# Patient Record
Sex: Female | Born: 1960 | Race: Black or African American | Hispanic: No | Marital: Married | Smoking: Never smoker
Health system: Southern US, Community
[De-identification: ages and names within clinical notes are randomized; demographics above are authoritative.]

## PROBLEM LIST (undated history)

## (undated) DIAGNOSIS — J189 Pneumonia, unspecified organism: Secondary | ICD-10-CM

## (undated) DIAGNOSIS — Z992 Dependence on renal dialysis: Secondary | ICD-10-CM

## (undated) DIAGNOSIS — I10 Essential (primary) hypertension: Secondary | ICD-10-CM

## (undated) DIAGNOSIS — B20 Human immunodeficiency virus [HIV] disease: Secondary | ICD-10-CM

## (undated) DIAGNOSIS — Z8661 Personal history of infections of the central nervous system: Secondary | ICD-10-CM

## (undated) DIAGNOSIS — N186 End stage renal disease: Secondary | ICD-10-CM

## (undated) DIAGNOSIS — Z9289 Personal history of other medical treatment: Secondary | ICD-10-CM

## (undated) DIAGNOSIS — D649 Anemia, unspecified: Secondary | ICD-10-CM

## (undated) DIAGNOSIS — I639 Cerebral infarction, unspecified: Secondary | ICD-10-CM

## (undated) DIAGNOSIS — R569 Unspecified convulsions: Secondary | ICD-10-CM

## (undated) HISTORY — PX: EYE SURGERY: SHX253

## (undated) HISTORY — PX: UMBILICAL HERNIA REPAIR: SHX196

## (undated) HISTORY — PX: HERNIA REPAIR: SHX51

## (undated) HISTORY — PX: THROMBECTOMY: PRO61

## (undated) HISTORY — PX: AV FISTULA PLACEMENT: SHX1204

---

## 2002-02-28 HISTORY — PX: TUBAL LIGATION: SHX77

## 2012-09-29 ENCOUNTER — Emergency Department (HOSPITAL_COMMUNITY): Payer: Medicare Other

## 2012-09-29 ENCOUNTER — Inpatient Hospital Stay (HOSPITAL_COMMUNITY)
Admission: EM | Admit: 2012-09-29 | Discharge: 2012-10-02 | DRG: 640 | Disposition: A | Discharge status: Home / Self Care | Payer: Medicare Other | Attending: Nephrology | Admitting: Nephrology

## 2012-09-29 ENCOUNTER — Encounter (HOSPITAL_COMMUNITY): Payer: Self-pay | Admitting: *Deleted

## 2012-09-29 DIAGNOSIS — I120 Hypertensive chronic kidney disease with stage 5 chronic kidney disease or end stage renal disease: Secondary | ICD-10-CM | POA: Diagnosis present

## 2012-09-29 DIAGNOSIS — Z992 Dependence on renal dialysis: Secondary | ICD-10-CM

## 2012-09-29 DIAGNOSIS — I10 Essential (primary) hypertension: Secondary | ICD-10-CM

## 2012-09-29 DIAGNOSIS — Z8661 Personal history of infections of the central nervous system: Secondary | ICD-10-CM

## 2012-09-29 DIAGNOSIS — Z79899 Other long term (current) drug therapy: Secondary | ICD-10-CM

## 2012-09-29 DIAGNOSIS — N186 End stage renal disease: Secondary | ICD-10-CM

## 2012-09-29 DIAGNOSIS — E875 Hyperkalemia: Secondary | ICD-10-CM

## 2012-09-29 DIAGNOSIS — M899 Disorder of bone, unspecified: Secondary | ICD-10-CM | POA: Diagnosis present

## 2012-09-29 DIAGNOSIS — I959 Hypotension, unspecified: Secondary | ICD-10-CM | POA: Diagnosis not present

## 2012-09-29 DIAGNOSIS — B20 Human immunodeficiency virus [HIV] disease: Secondary | ICD-10-CM

## 2012-09-29 DIAGNOSIS — D631 Anemia in chronic kidney disease: Secondary | ICD-10-CM | POA: Diagnosis present

## 2012-09-29 DIAGNOSIS — N2581 Secondary hyperparathyroidism of renal origin: Secondary | ICD-10-CM | POA: Diagnosis present

## 2012-09-29 DIAGNOSIS — M949 Disorder of cartilage, unspecified: Secondary | ICD-10-CM | POA: Diagnosis present

## 2012-09-29 DIAGNOSIS — N19 Unspecified kidney failure: Secondary | ICD-10-CM

## 2012-09-29 HISTORY — DX: Personal history of infections of the central nervous system: Z86.61

## 2012-09-29 HISTORY — DX: End stage renal disease: N18.6

## 2012-09-29 HISTORY — DX: Human immunodeficiency virus (HIV) disease: B20

## 2012-09-29 HISTORY — DX: Essential (primary) hypertension: I10

## 2012-09-29 HISTORY — DX: End stage renal disease: Z99.2

## 2012-09-29 LAB — BASIC METABOLIC PANEL
CO2: 18 mEq/L — ABNORMAL LOW (ref 19–32)
Glucose, Bld: 111 mg/dL — ABNORMAL HIGH (ref 70–99)
Potassium: 7 mEq/L (ref 3.5–5.1)
Sodium: 137 mEq/L (ref 135–145)

## 2012-09-29 LAB — CBC WITH DIFFERENTIAL/PLATELET
Eosinophils Relative: 9 % — ABNORMAL HIGH (ref 0–5)
HCT: 33.6 % — ABNORMAL LOW (ref 36.0–46.0)
Lymphocytes Relative: 26 % (ref 12–46)
Lymphs Abs: 1.2 10*3/uL (ref 0.7–4.0)
MCV: 89.1 fL (ref 78.0–100.0)
Monocytes Absolute: 0.6 10*3/uL (ref 0.1–1.0)
RBC: 3.77 MIL/uL — ABNORMAL LOW (ref 3.87–5.11)
WBC: 4.7 10*3/uL (ref 4.0–10.5)

## 2012-09-29 MED ORDER — SODIUM CHLORIDE 0.9 % IV SOLN
1.0000 g | Freq: Once | INTRAVENOUS | Status: DC
  Filled 2012-09-29: qty 10

## 2012-09-29 MED ORDER — HEPARIN SODIUM (PORCINE) 1000 UNIT/ML DIALYSIS
2000.0000 [IU] | INTRAMUSCULAR | Status: DC | PRN
  Administered 2012-09-29: 2000 [IU] via INTRAVENOUS_CENTRAL

## 2012-09-29 MED ORDER — HEPARIN SODIUM (PORCINE) 1000 UNIT/ML DIALYSIS: 1000.0000 [IU] | INTRAMUSCULAR | Status: DC | PRN

## 2012-09-29 MED ORDER — ACETAMINOPHEN 325 MG PO TABS: 650.0000 mg | ORAL_TABLET | Freq: Four times a day (QID) | ORAL | Status: DC | PRN

## 2012-09-29 MED ORDER — SODIUM CHLORIDE 0.9 % IV SOLN: 100.0000 mL | INTRAVENOUS | Status: DC | PRN

## 2012-09-29 MED ORDER — POLYETHYLENE GLYCOL 3350 17 G PO PACK
17.0000 g | PACK | Freq: Every day | ORAL | Status: DC | PRN
  Filled 2012-09-29: qty 1

## 2012-09-29 MED ORDER — SODIUM CHLORIDE 0.9 % IJ SOLN
3.0000 mL | Freq: Two times a day (BID) | INTRAMUSCULAR | Status: DC
  Administered 2012-09-30 (×3): 3 mL via INTRAVENOUS

## 2012-09-29 MED ORDER — LIDOCAINE HCL (PF) 1 % IJ SOLN: 5.0000 mL | INTRAMUSCULAR | Status: DC | PRN

## 2012-09-29 MED ORDER — LIDOCAINE-PRILOCAINE 2.5-2.5 % EX CREA
1.0000 "application " | TOPICAL_CREAM | CUTANEOUS | Status: DC | PRN
  Filled 2012-09-29: qty 5

## 2012-09-29 MED ORDER — PENTAFLUOROPROP-TETRAFLUOROETH EX AERO: 1.0000 "application " | INHALATION_SPRAY | CUTANEOUS | Status: DC | PRN

## 2012-09-29 MED ORDER — ACETAMINOPHEN 650 MG RE SUPP: 650.0000 mg | Freq: Four times a day (QID) | RECTAL | Status: DC | PRN

## 2012-09-29 MED ORDER — ALTEPLASE 2 MG IJ SOLR
2.0000 mg | Freq: Once | INTRAMUSCULAR | Status: AC | PRN
  Filled 2012-09-29: qty 2

## 2012-09-29 MED ORDER — NEPRO/CARBSTEADY PO LIQD
237.0000 mL | ORAL | Status: DC | PRN
  Filled 2012-09-29: qty 237

## 2012-09-29 MED ORDER — BISACODYL 10 MG RE SUPP: 10.0000 mg | Freq: Every day | RECTAL | Status: DC | PRN

## 2012-09-29 MED ORDER — ALBUTEROL SULFATE (5 MG/ML) 0.5% IN NEBU
5.0000 mg | INHALATION_SOLUTION | Freq: Once | RESPIRATORY_TRACT | Status: AC
  Administered 2012-09-29: 5 mg via RESPIRATORY_TRACT
  Filled 2012-09-29: qty 0.5

## 2012-09-29 NOTE — Consult Note (Deleted)
Bethany [name] 09/29/2012 Kengo Sturges D Requesting Physician:  Dr Jeraldine Loots  Reason for Consult:  ESRD pt from [place] moved to town with husband's job change and couldn't set up new HD center prior to coming  HPI: The patient is a 52 y.o. year-old with hx of HTN, HIV and ESRD. Started HD in 2006.  Pt just moved to town with husband's job change this week and she says she wasn't able set up new HD center prior to coming. Last HD was 4days ago, reports ankle swelling.  No sob, cp, fevers, chills, sweats. No n/v/d.  No abd pain.  Does not make urine.  No problem getting medications.  Takes Renvela 3 ac for binder.  On ART with 4 meds, dx'd in 2000.  No complications.  On 3 BP meds   ROS  no skin rash, no itching  no confusion    Past Medical History  Past Medical History  Diagnosis Date  . 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  Started HD in [place] in 2006, moved to [place] and rec'd dialysis in [place] starting in 2009.  Was receiving HD TTS schedule with a [facility].  Her nephrologist was Dr Vinnie Langton.  She moved to [facility] this week (Sep 29, 2012) and was not able to set up new HD unit prior to coming.  Last HD was Tuesday July 29th.  HD access is R FA AVF.  Gets heparin at the center, dry weight was 56kg, ran 3 hours.     [name] HIV disease 09/29/2012    Diagnosed on 2000, on HIV meds.  On ART, viral levels undetectable per pt.     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    . Hypertension 09/29/2012   Past Surgical History History reviewed. No pertinent past surgical history. Family History History reviewed. No pertinent family history. Social History  reports that she does not drink alcohol or use illicit drugs. Her tobacco history is not on file. Allergies  Allergies  Allergen Reactions  . Benadryl (Diphenhydramine Hcl)     jittery  . Latex  Hives and Itching   Home medications Prior to Admission medications   Medication Sig Start Date End Date Taking? Authorizing Provider  abacavir (ZIAGEN) 300 MG tablet Take 300 mg by mouth 2 (two) times daily.   Yes Historical Provider, MD  amLODipine (NORVASC) 10 MG tablet Take 10 mg by mouth daily.   Yes Historical Provider, MD  cinacalcet (SENSIPAR) 30 MG tablet Take 30 mg by mouth at bedtime.   Yes Historical Provider, MD  cloNIDine (CATAPRES) 0.2 MG tablet Take 0.2 mg by mouth daily.   Yes Historical Provider, MD  etravirine (INTELENCE) 100 MG tablet Take 200 mg by mouth 2 (two) times daily with a meal.   Yes Historical Provider, MD  metoprolol (LOPRESSOR) 100 MG tablet Take 100 mg by mouth 2 (two) times daily.   Yes Historical Provider, MD  oxyCODONE-acetaminophen (ENDOCET) 5-325 MG per tablet Take 1 tablet by mouth every 4 (four) hours as needed for pain.   Yes Historical Provider, MD  raltegravir (ISENTRESS) 400 MG tablet Take 400 mg by mouth 2 (two) times daily.   Yes Historical Provider, MD  sevelamer carbonate (RENVELA) 2.4 G PACK Take 2.4 g by mouth 3 (three) times daily.   Yes Historical Provider,  MD  temazepam (RESTORIL) 15 MG capsule Take 15 mg by mouth at bedtime as needed for sleep.   Yes Historical Provider, MD  tenofovir (VIREAD) 300 MG tablet Take 300 mg by mouth every 7 (seven) days.   Yes Historical Provider, MD   Liver Function Tests No results found for this basename: AST, ALT, ALKPHOS, BILITOT, PROT, ALBUMIN,  in the last 168 hours No results found for this basename: LIPASE, AMYLASE,  in the last 168 hours CBC  Recent Labs Lab 09/29/12 1746  WBC 4.7  NEUTROABS 2.5  HGB 11.2*  HCT 33.6*  MCV 89.1  PLT 141*   Basic Metabolic Panel  Recent Labs Lab 09/29/12 1607  NA 137  K 7.0*  CL 95*  CO2 18*  GLUCOSE 111*  BUN 114*  CREATININE 15.68*  CALCIUM 7.7*   Physical Exam:  Blood pressure 130/70, pulse 57, temperature 97.8 F (36.6 C), temperature source  Oral, resp. rate 18, height 5\' 2"  (1.575 m), weight 56.7 kg (125 lb), SpO2 100.00%. Gen: alert, no distress Skin: no rash, cyanosis HEENT:  EOMI, sclera anicteric, throat dry Neck: no JVD no LAN Chest: clear bilat CV: regular, faint SEM, no rub, pedal pulses intact Abdomen: soft, nontender, no mass, liver down 3 cm Ext: no LE edema, no joint effusion, no gangrene/ulcers Neuro: alert, Ox3, nonfocal Access: R forearm AVF patent   Outpatient HD ([place] [facility]  TTS) Dry wt 56kg   Gets heparin   3hr HD  Impression 1. Hyperkalemia 2. Uremia, mild, from missed HD 3. HIV, on ART 4. ESRD, new to town without outpt unit 5. HTN- cont 3 BP meds 6. Anemia- Hb 11.2 7. Sec HPT- check phos  Plan-  Admit, HD tonight, start CLIP process on Monday, prob will need longer HD time  Vinson Moselle  MD Pager [phone]    Cell  [phone] 09/29/2012, 6:55 PM

## 2012-09-29 NOTE — ED Notes (Signed)
Pt reports recently moving here and they did not set up her dialysis treatments. Last tx was Tuesday, only complaint is mild swelling to feet. Airway intact.

## 2012-09-29 NOTE — ED Notes (Signed)
Critical lab potassium 7.0 reported to provider.

## 2012-09-29 NOTE — ED Provider Notes (Signed)
  This was a shared visit with a mid-level provided (NP or PA).  Throughout the patient's course I was available for consultation/collaboration.  I saw the ECG (if appropriate), relevant labs and studies - I agree with the interpretation. There were notable T wave peaks On my exam the patient was in no distress.  With her elevated potassium we discussed the patient's case with nephrology who assisted with disposition      Gerhard Munch, MD 09/29/12 2354

## 2012-09-29 NOTE — ED Notes (Signed)
Attempted IV unable to access stated in the past they had to page IV team. Paged IV team.

## 2012-09-29 NOTE — ED Notes (Addendum)
Pt came to ED due to missed hemodialysis appointment. She last received hemodialysis on Tue 7/29. She states she in not yet experiencing complications from missing her hemodialysis, but felt she would be experiencing complications soon due to past experience.

## 2012-09-29 NOTE — ED Notes (Signed)
IV team arrived patient refused IV until absolutely need IV.

## 2012-09-29 NOTE — ED Notes (Signed)
Attempted report 

## 2012-09-29 NOTE — ED Provider Notes (Signed)
CSN: [id number]     Arrival date & time 09/29/12  1446 History     First MD Initiated Contact with Patient 09/29/12 1504     Chief Complaint  Patient presents with  . Vascular Access Problem   (Consider location/radiation/quality/duration/timing/severity/associated sxs/prior Treatment) HPI Comments: Patient with h/o ESRD, HIV on HAART -- presents with complaint of missing dialysis. Patient states that she recently moved from [place] and her last dialysis was 4 days ago. She missed her session 2 days ago and today. She has a history of pulmonary edema when missing a weeks worth of sessions. Patient currently does not complain of shortness of breath. She has minimal edema to her feet. She denies recent illness including fever, vomiting. Patient has been taking her medications as directed. Onset of symptoms gradual. Course is constant. Nothing makes symptoms better or worse. Patient dialyzes through a right upper extremity fistula. Patient states that she spoke with her social worker regarding getting a dialysis site in [place], however she was rejected from that site because she needs an ambulance to go to dialysis.  The history is provided by the patient.    Past Medical History  Diagnosis Date  . Renal disorder   . Stroke   . Meningitis   . HIV disease    History reviewed. No pertinent past surgical history. History reviewed. No pertinent family history. History  Substance Use Topics  . Smoking status: Not on file  . Smokeless tobacco: Not on file  . Alcohol Use: No   OB History   Grav Para Term Preterm Abortions TAB SAB Ect Mult Living                 Review of Systems  Constitutional: Negative for fever.  HENT: Negative for sore throat and rhinorrhea.   Eyes: Negative for redness.  Respiratory: Negative for cough.   Cardiovascular: Positive for leg swelling. Negative for chest pain.  Gastrointestinal: Positive for nausea. Negative for vomiting, abdominal pain and  diarrhea.  Genitourinary: Negative for vaginal discharge.  Musculoskeletal: Negative for myalgias.  Skin: Negative for rash.  Neurological: Negative for headaches.    Allergies  Benadryl and Latex  Home Medications   Current Outpatient Rx  Name  Route  Sig  Dispense  Refill  . abacavir (ZIAGEN) 300 MG tablet   Oral   Take 300 mg by mouth 2 (two) times daily.         [name] amLODipine (NORVASC) 10 MG tablet   Oral   Take 10 mg by mouth daily.         . cinacalcet (SENSIPAR) 30 MG tablet   Oral   Take 30 mg by mouth at bedtime.         . cloNIDine (CATAPRES) 0.2 MG tablet   Oral   Take 0.2 mg by mouth daily.         [name] etravirine (INTELENCE) 100 MG tablet   Oral   Take 200 mg by mouth 2 (two) times daily with a meal.         . metoprolol (LOPRESSOR) 100 MG tablet   Oral   Take 100 mg by mouth 2 (two) times daily.         [name] oxyCODONE-acetaminophen (ENDOCET) 5-325 MG per tablet   Oral   Take 1 tablet by mouth every 4 (four) hours as needed for pain.         . raltegravir (ISENTRESS) 400 MG tablet   Oral   Take  400 mg by mouth 2 (two) times daily.         . sevelamer carbonate (RENVELA) 2.4 G PACK   Oral   Take 2.4 g by mouth 3 (three) times daily.         . temazepam (RESTORIL) 15 MG capsule   Oral   Take 15 mg by mouth at bedtime as needed for sleep.         [name] tenofovir (VIREAD) 300 MG tablet   Oral   Take 300 mg by mouth every 7 (seven) days.          BP 172/73  Pulse 89  Temp(Src) 98.2 F (36.8 C) (Oral)  Resp 18  SpO2 97% Physical Exam  Nursing note and vitals reviewed. Constitutional: She appears well-developed and well-nourished.  HENT:  Head: Normocephalic and atraumatic.  Eyes: Conjunctivae are normal. Right eye exhibits no discharge. Left eye exhibits no discharge.  Neck: Normal range of motion. Neck supple.  Cardiovascular: Normal rate, regular rhythm and normal heart sounds.   Pulmonary/Chest: Effort normal and breath  sounds normal.  Abdominal: Soft. There is no tenderness.  Musculoskeletal: She exhibits edema. She exhibits no tenderness.  1+ pitting edema top of feet and ankles bilaterally, symmetric.   Fistula with palpable thrill right upper extremity.  Neurological: She is alert.  Skin: Skin is warm and dry.  Psychiatric: She has a normal mood and affect.    ED Course   Procedures (including critical care time)  Labs Reviewed  BASIC METABOLIC PANEL - Abnormal; Notable for the following:    Potassium 7.0 (*)    Chloride 95 (*)    CO2 18 (*)    Glucose, Bld 111 (*)    BUN 114 (*)    Creatinine, Ser 15.68 (*)    Calcium 7.7 (*)    GFR calc non Af Amer 2 (*)    GFR calc Af Amer 3 (*)    All other components within normal limits  MAGNESIUM - Abnormal; Notable for the following:    Magnesium 3.2 (*)    All other components within normal limits  CBC WITH DIFFERENTIAL   Dg Chest 2 View  09/29/2012   *RADIOLOGY REPORT*  Clinical Data: Shortness of breath  CHEST - 2 VIEW  Comparison: None.  Findings: The cardiac shadow is enlarged.  Mild vascular congestion is noted.  No focal pulmonary edema is seen.  No focal infiltrate or effusion is noted.  IMPRESSION: Mild vascular congestion.   Original Report Authenticated By: Alcide Clever, M.D.   1. Hyperkalemia     3:25 PM Patient seen and examined. Work-up initiated. Medications ordered.   Vital signs reviewed and are as follows: Filed Vitals:   09/29/12 1450  BP: 172/73  Pulse: 89  Temp: 98.2 F (36.8 C)  Resp: 18    Date: 09/29/2012  Rate: 58  Rhythm: normal sinus rhythm  QRS Axis: normal  Intervals: normal  ST/T Wave abnormalities: nonspecific ST/T changes  Conduction Disutrbances:none, peaked t-waves V2-V3  Narrative Interpretation:   Old EKG Reviewed: none available  D/w Dr. Jeraldine Loots. K=7.0. Albuterol ordered. Ca ordered but cannot get IV access. Will defer to nephrology.   Spoke with Dr. Arlean Hopping who will see pt in ED.    MDM   Admit for dialysis in setting of hyperkalemia.    Renne Crigler, PA-C 09/29/12 1749

## 2012-09-29 NOTE — ED Notes (Signed)
Paged IV team to establish IV access PA and Nurse spoke with patient verbalized understanding.

## 2012-09-29 NOTE — ED Notes (Signed)
Nephrologist at bedside

## 2012-09-29 NOTE — ED Notes (Signed)
IV team unable to obtain access.  

## 2012-09-30 LAB — COMPREHENSIVE METABOLIC PANEL
ALT: 11 U/L (ref 0–35)
Alkaline Phosphatase: 110 U/L (ref 39–117)
BUN: 27 mg/dL — ABNORMAL HIGH (ref 6–23)
CO2: 29 mEq/L (ref 19–32)
Chloride: 94 mEq/L — ABNORMAL LOW (ref 96–112)
GFR calc Af Amer: 8 mL/min — ABNORMAL LOW (ref >90)
Glucose, Bld: 93 mg/dL (ref 70–99)
Potassium: 3.8 mEq/L (ref 3.5–5.1)
Sodium: 136 mEq/L (ref 135–145)
Total Bilirubin: 0.3 mg/dL (ref 0.3–1.2)
Total Protein: 7 g/dL (ref 6.0–8.3)

## 2012-09-30 LAB — HEPATITIS B SURFACE ANTIGEN: Hepatitis B Surface Ag: NEGATIVE

## 2012-09-30 LAB — HEPATITIS B CORE ANTIBODY, TOTAL: Hep B Core Total Ab: NEGATIVE

## 2012-09-30 MED ORDER — SEVELAMER CARBONATE 2.4 G PO PACK
2.4000 g | PACK | Freq: Three times a day (TID) | ORAL | Status: DC
  Administered 2012-10-01 – 2012-10-02 (×4): 2.4 g via ORAL
  Filled 2012-09-30 (×7): qty 1

## 2012-09-30 MED ORDER — ETRAVIRINE 100 MG PO TABS
200.0000 mg | ORAL_TABLET | Freq: Two times a day (BID) | ORAL | Status: DC
  Administered 2012-09-30 – 2012-10-02 (×5): 200 mg via ORAL
  Filled 2012-09-30 (×7): qty 2

## 2012-09-30 MED ORDER — TENOFOVIR DISOPROXIL FUMARATE 300 MG PO TABS: 300.0000 mg | ORAL_TABLET | ORAL | Status: DC

## 2012-09-30 MED ORDER — AMLODIPINE BESYLATE 10 MG PO TABS
10.0000 mg | ORAL_TABLET | Freq: Every day | ORAL | Status: DC
  Administered 2012-09-30 – 2012-10-01 (×2): 10 mg via ORAL
  Filled 2012-09-30 (×2): qty 1

## 2012-09-30 MED ORDER — CLONIDINE HCL 0.2 MG PO TABS
0.2000 mg | ORAL_TABLET | Freq: Every day | ORAL | Status: DC
  Administered 2012-09-30 – 2012-10-01 (×2): 0.2 mg via ORAL
  Filled 2012-09-30 (×2): qty 1

## 2012-09-30 MED ORDER — METOPROLOL TARTRATE 100 MG PO TABS
100.0000 mg | ORAL_TABLET | Freq: Two times a day (BID) | ORAL | Status: DC
  Administered 2012-09-30 – 2012-10-01 (×4): 100 mg via ORAL
  Filled 2012-09-30 (×5): qty 1

## 2012-09-30 MED ORDER — TEMAZEPAM 15 MG PO CAPS
15.0000 mg | ORAL_CAPSULE | Freq: Every evening | ORAL | Status: DC | PRN
  Administered 2012-10-01: 15 mg via ORAL
  Filled 2012-09-30: qty 1

## 2012-09-30 MED ORDER — SEVELAMER CARBONATE 800 MG PO TABS
2400.0000 mg | ORAL_TABLET | Freq: Three times a day (TID) | ORAL | Status: DC
  Administered 2012-09-30 (×2): 2400 mg via ORAL
  Filled 2012-09-30 (×4): qty 3

## 2012-09-30 MED ORDER — ABACAVIR SULFATE 300 MG PO TABS
300.0000 mg | ORAL_TABLET | Freq: Two times a day (BID) | ORAL | Status: DC
  Administered 2012-09-30 – 2012-10-01 (×5): 300 mg via ORAL
  Filled 2012-09-30 (×8): qty 1

## 2012-09-30 MED ORDER — OXYCODONE-ACETAMINOPHEN 5-325 MG PO TABS: 1.0000 | ORAL_TABLET | ORAL | Status: DC | PRN

## 2012-09-30 MED ORDER — RALTEGRAVIR POTASSIUM 400 MG PO TABS
400.0000 mg | ORAL_TABLET | Freq: Two times a day (BID) | ORAL | Status: DC
  Administered 2012-09-30 – 2012-10-02 (×6): 400 mg via ORAL
  Filled 2012-09-30 (×7): qty 1

## 2012-09-30 MED ORDER — CAMPHOR-MENTHOL 0.5-0.5 % EX LOTN
TOPICAL_LOTION | CUTANEOUS | Status: DC | PRN
Start: 2012-09-30 — End: 2012-10-02
  Filled 2012-09-30: qty 222

## 2012-09-30 MED ORDER — CINACALCET HCL 30 MG PO TABS
30.0000 mg | ORAL_TABLET | Freq: Every day | ORAL | Status: DC
  Administered 2012-09-30 – 2012-10-01 (×3): 30 mg via ORAL
  Filled 2012-09-30 (×5): qty 1

## 2012-09-30 NOTE — Progress Notes (Signed)
Pt admitted to the unit. Pt is alert and oriented. Pt oriented to room, staff, and call bell. Educated on fall safety plan. Bed in lowest position. Full assessment to Epic. Call bell with in reach. Educated to call for assists. Will continue to monitor. Romi Rathel, RN 

## 2012-09-30 NOTE — H&P (Signed)
Bethany [name] 09/29/2012 Korayma Hagwood D Requesting Physician:  Dr Lockwood  Reason for Consult:  [place]'s job change and couldn't set up new HD center prior to coming  HPI: The patient is a 52 y.o. year-old with hx of HTN, HIV and ESRD. Started HD in 2006.  Pt just moved to town with husband's job change this week and she says she wasn't able set up new HD center prior to coming. Last HD was 4days ago, reports ankle swelling.  No sob, cp, fevers, chills, sweats. No n/v/d.  No abd pain.  Does not make urine.  No problem getting medications.  Takes Renvela 3 ac for binder.  On ART with 4 meds, dx'd in 2000.  No complications.  On 3 BP meds   ROS  no skin rash, no itching  no confusion    Past Medical History  Past Medical History  Diagnosis Date  . 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  Started HD in [place] in 2006, moved to [place] and rec'd dialysis in [place] starting in 2009.  Was receiving HD TTS schedule with a [facility].  Her nephrologist was Dr Dada.  She moved to  this week (Sep 29, 2012) and was not able to set up new HD unit prior to coming.  Last HD was Tuesday July 29th.  HD access is R FA AVF.  Gets heparin at the center, dry weight was 56kg, ran 3 hours.     . HIV disease 09/29/2012    Diagnosed on 2000, on HIV meds.  On ART, viral levels undetectable per pt.     . 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    . Hypertension 09/29/2012   Past Surgical History History reviewed. No pertinent past surgical history. Family History History reviewed. No pertinent family history. Social History  reports that she does not drink alcohol or use illicit drugs. Her tobacco history is not on file. Allergies  Allergies  Allergen Reactions  . Benadryl (Diphenhydramine Hcl)     jittery  . Latex  Hives and Itching   Home medications Prior to Admission medications   Medication Sig Start Date End Date Taking? Authorizing Provider  abacavir (ZIAGEN) 300 MG tablet Take 300 mg by mouth 2 (two) times daily.   Yes Historical Provider, MD  amLODipine (NORVASC) 10 MG tablet Take 10 mg by mouth daily.   Yes Historical Provider, MD  cinacalcet (SENSIPAR) 30 MG tablet Take 30 mg by mouth at bedtime.   Yes Historical Provider, MD  cloNIDine (CATAPRES) 0.2 MG tablet Take 0.2 mg by mouth daily.   Yes Historical Provider, MD  etravirine (INTELENCE) 100 MG tablet Take 200 mg by mouth 2 (two) times daily with a meal.   Yes Historical Provider, MD  metoprolol (LOPRESSOR) 100 MG tablet Take 100 mg by mouth 2 (two) times daily.   Yes Historical Provider, MD  oxyCODONE-acetaminophen (ENDOCET) 5-325 MG per tablet Take 1 tablet by mouth every 4 (four) hours as needed for pain.   Yes Historical Provider, MD  raltegravir (ISENTRESS) 400 MG tablet Take 400 mg by mouth 2 (two) times daily.   Yes Historical Provider, MD  sevelamer carbonate (RENVELA) 2.4 G PACK Take 2.4 g by mouth 3 (three) times daily.   Yes Historical Provider,   MD  temazepam (RESTORIL) 15 MG capsule Take 15 mg by mouth at bedtime as needed for sleep.   Yes Historical Provider, MD  tenofovir (VIREAD) 300 MG tablet Take 300 mg by mouth every 7 (seven) days.   Yes Historical Provider, MD   Liver Function Tests No results found for this basename: AST, ALT, ALKPHOS, BILITOT, PROT, ALBUMIN,  in the last 168 hours No results found for this basename: LIPASE, AMYLASE,  in the last 168 hours CBC  Recent Labs Lab 09/29/12 1746  WBC 4.7  NEUTROABS 2.5  HGB 11.2*  HCT 33.6*  MCV 89.1  PLT 141*   Basic Metabolic Panel  Recent Labs Lab 09/29/12 1607  NA 137  K 7.0*  CL 95*  CO2 18*  GLUCOSE 111*  BUN 114*  CREATININE 15.68*  CALCIUM 7.7*   Physical Exam:  Blood pressure 130/70, pulse 57, temperature 97.8 F (36.6 C), temperature source  Oral, resp. rate 18, height 5' 2" (1.575 m), weight 56.7 kg (125 lb), SpO2 100.00%. Gen: alert, no distress Skin: no rash, cyanosis HEENT:  EOMI, sclera anicteric, throat dry Neck: no JVD no LAN Chest: clear bilat CV: regular, faint SEM, no rub, pedal pulses intact Abdomen: soft, nontender, no mass, liver down 3 cm Ext: no LE edema, no joint effusion, no gangrene/ulcers Neuro: alert, Ox3, nonfocal Access: R forearm AVF patent   Outpatient HD ([place] [facility]  TTS) Dry wt 56kg   Gets heparin   3hr HD  Impression 1. Hyperkalemia 2. Uremia, mild, from missed HD 3. HIV, on ART 4. ESRD, new to town without outpt unit 5. HTN- cont 3 BP meds 6. Anemia- Hb 11.2 7. Sec HPT- check phos  Plan-  Admit, HD tonight, start CLIP process on Monday, prob will need longer HD time  Rob Trinaty Bundrick  MD Pager [phone]    Cell  [phone] 09/29/2012, 6:55 PM    

## 2012-09-30 NOTE — Progress Notes (Signed)
Subjective:  Feels better, no cos Objective Vital signs in last 24 hours: Filed Vitals:   09/29/12 2341 09/29/12 2350 09/30/12 0032 09/30/12 0559  BP: 169/82 193/92 179/87 186/77  Pulse: 57 59 65 64  Temp: 97.4 F (36.3 C)  98.5 F (36.9 C) 99 F (37.2 C)  TempSrc: Oral  Oral Oral  Resp: 20 19 18 16   Height:   5\' 2"  (1.575 m)   Weight: 55.6 kg (122 lb 9.2 oz)  56.8 kg (125 lb 3.5 oz)   SpO2: 99%  98% 95%   Weight change:   Intake/Output Summary (Last 24 hours) at 09/30/12 1029 Last data filed at 09/29/12 2341  Gross per 24 hour  Intake      0 ml  Output   2002 ml  Net  -2002 ml   Labs: Basic Metabolic Panel:  Recent Labs Lab 09/29/12 1607 09/29/12 2153 09/30/12 0525  NA 137  --  136  K 7.0* 3.2* 3.8  CL 95*  --  94*  CO2 18*  --  29  GLUCOSE 111*  --  93  BUN 114*  --  27*  CREATININE 15.68*  --  6.54*  CALCIUM 7.7*  --  8.4   Liver Function Tests:  Recent Labs Lab 09/30/12 0525  AST 14  ALT 11  ALKPHOS 110  BILITOT 0.3  PROT 7.0  ALBUMIN 3.5    Recent Labs Lab 09/29/12 1746  WBC 4.7  NEUTROABS 2.5  HGB 11.2*  HCT 33.6*  MCV 89.1  PLT 141*    Iron Studies: No results found for this basename: IRON, TIBC, TRANSFERRIN, FERRITIN,  in the last 72 hours Studies/Results: Dg Chest 2 View  09/29/2012   *RADIOLOGY REPORT*  Clinical Data: Shortness of breath  CHEST - 2 VIEW  Comparison: None.  Findings: The cardiac shadow is enlarged.  Mild vascular congestion is noted.  No focal pulmonary edema is seen.  No focal infiltrate or effusion is noted.  IMPRESSION: Mild vascular congestion.   Original Report Authenticated By: Alcide Clever, M.D.   Medications:   . abacavir  300 mg Oral BID  . amLODipine  10 mg Oral Daily  . calcium gluconate 1 GM IV  1 g Intravenous Once  . cinacalcet  30 mg Oral QHS  . cloNIDine  0.2 mg Oral Daily  . etravirine  200 mg Oral BID WC  . metoprolol  100 mg Oral BID  . raltegravir  400 mg Oral BID  . sevelamer carbonate   2,400 mg Oral TID WC  . sodium chloride  3 mL Intravenous Q12H  . [START ON 10/06/2012] tenofovir  300 mg Oral Q7 days   I  have reviewed scheduled and prn medications.  Physical Exam: General: Alert NAD, bf Heart: RRR, 1/6 sem lsb, no rub Lungs: CTA bilaterdaly Abdomen: BS pos,. , soft, nontender Extremities: Dialysis Access: no pedal edema, pos. Bruit  R FA AVF   Outpatient HD ([facility] [facility] TTS)  Dry wt 56kg Gets heparin 3hr HD   Impression  1. Hyperkalemia- resolved with hd K= 3.8 this am  2. Uremia, mild, from missed HD= no symptoms this am 3. HIV, on ART= she has phone number to call for apt 4. ESRD, new to town without outpt unit= Was TTS schedule/ [facility] start in am/  5. HTN- cont 3 BP meds/ challenge wt next hd ?? Could taper down some bp meds/ sl below edw 55.6 post hd/ Pt tells me she is  wheel chair bound ?? able to stand for post and pre wts 6. Anemia- Hb 11.2/ no aranesp or iron /fu am labs 7. Sec HPT- fu renal panel/ on Renvela binder/ no vit d currently , need to call Her [facility] unit  Monday for pth data and vit d   Lenny Pastel, PA-C [facility] Beeper [phone] 09/30/2012,10:29 AM  LOS: 1 day   Patient seen and examined.  Agree with assessment and plan as above. Vinson Moselle  MD Pager [phone]    Cell  [phone] 09/30/2012, 12:03 PM

## 2012-10-01 MED ORDER — AMLODIPINE BESYLATE 10 MG PO TABS
10.0000 mg | ORAL_TABLET | Freq: Every day | ORAL | Status: DC
  Filled 2012-10-01: qty 1

## 2012-10-01 MED ORDER — RENA-VITE PO TABS
1.0000 | ORAL_TABLET | Freq: Every day | ORAL | Status: DC
  Administered 2012-10-01 – 2012-10-02 (×2): 1 via ORAL
  Filled 2012-10-01 (×2): qty 1

## 2012-10-01 NOTE — Progress Notes (Signed)
Called [facility] regarding pt's new onset of junctional cardiac rhythm. Deterding, MD responded.  Continue to monitor patient at this time. No new orders. Gilman Schmidt

## 2012-10-01 NOTE — Progress Notes (Signed)
Patient ID: Bethany [name], female   DOB: 24-Jul-1960, 52 y.o.   MRN: [id number]   [name] Progress Note   Assessment/ Plan:   1.Hyperkalemia/Uremia, mild, from missed HD= improved s/p HD 2. HIV, on ART; she has the numbers for [facility] and will call to get herself established there as a patient 3. ESRD, new to town without outpt unit: process started for placement to OP HD unit 4. HTN- will continue to follow BP trends and adjust EDW accordingly 5. 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  Labs: BMET  Recent Labs Lab 09/29/12 1607 09/29/12 2153 09/30/12 0525  NA 137  --  136  K 7.0* 3.2* 3.8  CL 95*  --  94*  CO2 18*  --  29  GLUCOSE 111*  --  93  BUN 114*  --  27*  CREATININE 15.68*  --  6.54*  CALCIUM 7.7*  --  8.4   CBC  Recent Labs Lab 09/29/12 1746  WBC 4.7  NEUTROABS 2.5  HGB 11.2*  HCT 33.6*  MCV 89.1  PLT 141*   Medications:    . abacavir  300 mg Oral BID  . amLODipine  10 mg Oral Daily  . calcium gluconate 1 GM IV  1 g Intravenous Once  . cinacalcet  30 mg Oral QHS  . cloNIDine  0.2 mg Oral Daily  . etravirine  200 mg Oral BID WC  . metoprolol  100 mg Oral BID  . raltegravir  400 mg Oral BID  . sevelamer carbonate  2.4 g Oral TID WC  . sodium chloride  3 mL Intravenous Q12H  . [START ON 10/06/2012] tenofovir  300 mg Oral Q7 days   Zetta Bills, MD 10/01/2012, 11:26 AM

## 2012-10-01 NOTE — Clinical Social Work Psychosocial (Signed)
Clinical Social Work Department BRIEF PSYCHOSOCIAL ASSESSMENT 10/01/2012  Patient:  Bethany [name], Bethany [name]     Account Number:  [id number]     Admit date:  09/29/2012  Clinical Social Worker:  Delmer Islam  Date/Time:  10/01/2012 03:01 AM  Referred by:  Physician  Date Referred:  10/01/2012 Referred for  Transportation assistance   Other Referral:   Interview type:  Patient Other interview type:    PSYCHOSOCIAL DATA Living Status:  FAMILY Admitted from facility:   Level of care:   Primary support name:  Burnett Corrente Primary support relationship to patient:  SPOUSE Degree of support available:    CURRENT CONCERNS Current Concerns  Other - See comment   Other Concerns:   Transportation to dialysis    SOCIAL WORK ASSESSMENT / PLAN CSW talked with patient regarding her request for assistance with transportation. Patient reported that she, her husband and children (sons ages 46 & 57 and daughter age 36) moved to [place] from [place], [place] as her husband has a job here and will need assistance with transportation. Patient stated that she has requested that her Medicaid be terminiated in [place] and will apply in in [place] for [place]. Patient reported that she does use a manual wheelchair due to let weakness. Patient was given SCAT application to complete and advised that CSW can fax application to SCAT office once completed.   Assessment/plan status:  Information/Referral to [facility] Other assessment/ plan:   Information/referral to community resources:   SCAT application    PATIENT'S/FAMILY'S RESPONSE TO PLAN OF CARE: Patient very receptive to talking with CSW as she requested assistance with transportation.

## 2012-10-02 LAB — RENAL FUNCTION PANEL
Albumin: 3.3 g/dL — ABNORMAL LOW (ref 3.5–5.2)
BUN: 61 mg/dL — ABNORMAL HIGH (ref 6–23)
CO2: 26 mEq/L (ref 19–32)
CO2: 24 mEq/L (ref 19–32)
Chloride: 95 mEq/L — ABNORMAL LOW (ref 96–112)
Chloride: 95 mEq/L — ABNORMAL LOW (ref 96–112)
GFR calc Af Amer: 5 mL/min — ABNORMAL LOW (ref >90)
Glucose, Bld: 106 mg/dL — ABNORMAL HIGH (ref 70–99)
Phosphorus: 8.1 mg/dL — ABNORMAL HIGH (ref 2.3–4.6)
Potassium: 4.8 mEq/L (ref 3.5–5.1)
Potassium: 4.9 mEq/L (ref 3.5–5.1)
Sodium: 137 mEq/L (ref 135–145)

## 2012-10-02 LAB — CBC
HCT: 34.4 % — ABNORMAL LOW (ref 36.0–46.0)
Hemoglobin: 11.4 g/dL — ABNORMAL LOW (ref 12.0–15.0)
RBC: 3.83 MIL/uL — ABNORMAL LOW (ref 3.87–5.11)
RDW: 16.1 % — ABNORMAL HIGH (ref 11.5–15.5)
WBC: 3.9 10*3/uL — ABNORMAL LOW (ref 4.0–10.5)

## 2012-10-02 MED ORDER — METOPROLOL SUCCINATE ER 25 MG PO TB24
25.0000 mg | ORAL_TABLET | Freq: Every day | ORAL | Status: DC
  Filled 2012-10-02: qty 1

## 2012-10-02 MED ORDER — LISINOPRIL 20 MG PO TABS: 20.0000 mg | ORAL_TABLET | Freq: Every day | ORAL | Status: DC

## 2012-10-02 MED ORDER — DOXERCALCIFEROL 4 MCG/2ML IV SOLN: 2.0000 ug | INTRAVENOUS | Status: DC

## 2012-10-02 MED ORDER — LISINOPRIL 20 MG PO TABS
20.0000 mg | ORAL_TABLET | Freq: Every day | ORAL | Status: DC
  Filled 2012-10-02: qty 1

## 2012-10-02 MED ORDER — SEVELAMER CARBONATE 2.4 G PO PACK: 2.4000 g | PACK | Freq: Three times a day (TID) | ORAL | Status: DC

## 2012-10-02 MED ORDER — METOPROLOL SUCCINATE ER 25 MG PO TB24: 25.0000 mg | ORAL_TABLET | Freq: Every day | ORAL | Status: DC

## 2012-10-02 MED ORDER — RENA-VITE PO TABS: 1.0000 | ORAL_TABLET | Freq: Every day | ORAL | Status: AC

## 2012-10-02 MED ORDER — HEPARIN SODIUM (PORCINE) 1000 UNIT/ML DIALYSIS: 2500.0000 [IU] | INTRAMUSCULAR | Status: DC | PRN

## 2012-10-02 NOTE — Procedures (Signed)
Patient seen on Hemodialysis. QB 400, UF goal 4.5L Treatment adjusted as needed.  Zetta Bills MD [facility]. Office # [phone] Pager # [phone] 8:47 AM

## 2012-10-02 NOTE — Progress Notes (Signed)
Patient discharge Home per Md order.  Discharge instructions reviewed with patient and family.  Copies of all forms given and explained. Patient/family voiced understanding of all instructions.  Discharge in no acute distress. 

## 2012-10-02 NOTE — Discharge Summary (Addendum)
Physician Discharge Summary  Patient ID: Bethany [name] MRN: [id number] DOB/AGE: May 30, 1960 52 y.o.  Admit date: 09/29/2012 Discharge date: 10/02/2012  Admission Diagnoses: 1. Hyperkalemia 2. Mild uremic symptoms 3. End stage renal disease on hemodialysis-missed her last 2 treatments of dialysis 4. Hypertension 5. Anemia of chronic kidney disease 6. Chronic kidney disease-metabolic bone disease 7. HIV/AIDS-previous history of AIDS defining infections 8. Difficulties with gait/ambulation 9. Problems with compliance  Discharge Diagnoses:  Active Problems:   ESRD on hemodialysis   Hx of viral meningitis   HIV disease   Uremia   Hypertension   Discharged Condition: stable  Hospital Course:  Bethany [name] is a 52 y.o. year-old [place] American woman with hx of HTN, HIV and ESRD who has been on dialysis since 2006. 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 On admission she reported ankle swelling. Denied dyspnea, fevers, chest pain, chills, or diaphoresis. Denied nausea, vomiting, diarrhea or abdominal pain. She is anuric. No problem getting medications. Her hospital course was as follows: Problem #1: Hyperkalemia and mild uremic symptoms: This is secondary to missed dialysis and managed promptly with reinitiation of dialysis using care to not precipitate disequilibrium syndrome. Low blood flows were utilized. While in the hospital, the patient was placed on a Tuesday, Thursday and Saturday dialysis schedule and the process initiated for outpatient dialysis unit placement. She has been accepted to the [facility] kidney [facility] to where she'll present on Wednesday 10/03/2012 to commence hemodialysis. Estimated dry weight 53.5 kg. Problem #2: HIV infection/AIDS: She reports good adherence to her antiretroviral  therapy that had been initiated in [place]. She has numbers for the regional Center for infectious disease that she will call and get established for care. She understands fully the implications of not following up and going without her antiretroviral therapy. Yet again, the patient has been reminded of this responsibility that she has to take. Problem #3: Anemia of chronic kidney disease: The patient's hemoglobin was consistently greater than 10 g/dL, ESA therapy was not started and iron stores were not checked. Problem #4: Chronic kidney disease-metabolic bone disease: PTH level was checked and found to be elevated at 655, the patient was continued on her usual doses of Sensipar and will be restarted on intravenous Hectorol 2 mcg at dialysis upon discharge. Phosphorus was elevated and she was restarted on her outpatient doses of Renvela 2400 mg 3 times a day a.c. Problem #5: Hypotension: Antihypertensive therapy with amlodipine, beta blockers and clonidine was restarted during admission. She had some junctional rhythm development while in the hospital without associated bradycardia or hypotension and the decision was made to discontinue beta blockers and clonidine and to transition her over to a single negative chronotropic agent-metoprolol XL 25 mg by mouth each bedtime and ACE inhibitor-lisinopril 20 mg daily.   Consults: None  Significant Diagnostic Studies: radiology: CXR: pulmonary edema  Treatments: dialysis: Hemodialysis  Discharge Exam: Blood pressure 152/99, pulse 69, temperature 98.6 F (37 C), temperature source Oral, resp. rate 18, height 5\' 2"  (1.575 m), weight 53.3 kg (117 lb 8.1 oz), SpO2 97.00%. WUJ:WJXBJYNWGNF sitting up in her recliner AOZ:HYQMV RRR, normal S1 and S2  Resp:CTA bilaterally, no rales/rhonchi  HQI:ONGE, flat, NT, BS normal  Ext:No LE edema    Disposition: The patient is discharged home     Medication List  STOP taking these medications        cloNIDine 0.2 MG tablet  Commonly known as:  CATAPRES     metoprolol 100 MG tablet  Commonly known as:  LOPRESSOR      TAKE these medications       abacavir 300 MG tablet  Commonly known as:  ZIAGEN  Take 300 mg by mouth 2 (two) times daily.     amLODipine 10 MG tablet  Commonly known as:  NORVASC  Take 10 mg by mouth daily.     cinacalcet 30 MG tablet  Commonly known as:  SENSIPAR  Take 30 mg by mouth at bedtime.     ENDOCET 5-325 MG per tablet  Generic drug:  oxyCODONE-acetaminophen  Take 1 tablet by mouth every 4 (four) hours as needed for pain.     etravirine 100 MG tablet  Commonly known as:  INTELENCE  Take 200 mg by mouth 2 (two) times daily with a meal.     lisinopril 20 MG tablet  Commonly known as:  PRINIVIL,ZESTRIL  Take 1 tablet (20 mg total) by mouth daily.     metoprolol succinate 25 MG 24 hr tablet  Commonly known as:  TOPROL-XL  Take 1 tablet (25 mg total) by mouth at bedtime.     multivitamin Tabs tablet  Take 1 tablet by mouth daily.     raltegravir 400 MG tablet  Commonly known as:  ISENTRESS  Take 400 mg by mouth 2 (two) times daily.     sevelamer carbonate 2.4 G Pack  Commonly known as:  RENVELA  Take 2.4 g by mouth 3 (three) times daily with meals.     temazepam 15 MG capsule  Commonly known as:  RESTORIL  Take 15 mg by mouth at bedtime as needed for sleep.     tenofovir 300 MG tablet  Commonly known as:  VIREAD  Take 300 mg by mouth every 7 (seven) days.         SignedZetta Bills K. 10/02/2012, 4:26 PM

## 2012-10-02 NOTE — Progress Notes (Signed)
Patient ID: Bethany [name], female   DOB: 11/26/1960, 52 y.o.   MRN: [id number]   [facility] Progress Note   Assessment/ Plan:   1.New onset junctional rhythm: Clinically stable with normal electrolytes- will cycle enzymes 2. HIV, on ART; she has the numbers for [facility] and will call to get herself established there as a patient  3. ESRD, HD today and CLIP process started yesterday (previously at a [facility] unit in [place])  4. HTN- will continue to follow BP trends and adjust EDW accordingly  5. 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 Await PTH level to decide on VDRA   Subjective:   No emerging complaints- educated as to why she need to HD 4 hrs   Objective:   BP 168/85  Pulse 58  Temp(Src) 98.4 F (36.9 C) (Oral)  Resp 19  Ht 5\' 2"  (1.575 m)  Wt 57 kg (125 lb 10.6 oz)  BMI 22.98 kg/m2  SpO2 100%  Physical Exam: WUJ:WJXBJYNWGNF on HD AOZ:HYQMV RRR, normal S1 and S2  Resp:CTA bilaterally, no rales/rhonchi HQI:ONGE, flat, NT, BS normal Ext:No LE edema  Labs: BMET  Recent Labs Lab 09/29/12 1607 09/29/12 2153 09/30/12 0525 10/01/12 2249 10/02/12 0621  NA 137  --  136 137 137  K 7.0* 3.2* 3.8 4.8 4.9  CL 95*  --  94* 95* 95*  CO2 18*  --  29 26 24   GLUCOSE 111*  --  93 106* 96  BUN 114*  --  27* 55* 61*  CREATININE 15.68*  --  6.54* 10.21* 10.95*  CALCIUM 7.7*  --  8.4 7.3* 7.1*  PHOS  --   --   --  8.1* 8.9*   CBC  Recent Labs Lab 09/29/12 1746 10/02/12 0621  WBC 4.7 3.9*  NEUTROABS 2.5  --   HGB 11.2* 11.4*  HCT 33.6* 34.4*  MCV 89.1 89.8  PLT 141* 165   Medications:    . abacavir  300 mg Oral BID  . amLODipine  10 mg Oral QHS  . calcium gluconate 1 GM IV  1 g Intravenous Once  . cinacalcet  30 mg Oral QHS  . etravirine  200 mg Oral BID WC  . multivitamin  1 tablet Oral Daily  . raltegravir  400 mg Oral BID  . sevelamer carbonate  2.4 g Oral TID WC  . sodium chloride  3 mL  Intravenous Q12H  . [START ON 10/06/2012] tenofovir  300 mg Oral Q7 days   Zetta Bills, MD 10/02/2012, 8:42 AM

## 2012-11-13 ENCOUNTER — Ambulatory Visit: Payer: Medicare Other | Admitting: Internal Medicine

## 2012-11-15 ENCOUNTER — Inpatient Hospital Stay (HOSPITAL_COMMUNITY)
Admission: EM | Admit: 2012-11-15 | Discharge: 2012-11-17 | DRG: 682 | Disposition: A | Discharge status: Home / Self Care | Payer: Medicare Other | Attending: Family Medicine | Admitting: Family Medicine

## 2012-11-15 ENCOUNTER — Encounter (HOSPITAL_COMMUNITY): Payer: Self-pay | Admitting: *Deleted

## 2012-11-15 ENCOUNTER — Emergency Department (HOSPITAL_COMMUNITY): Payer: Medicare Other

## 2012-11-15 DIAGNOSIS — J9600 Acute respiratory failure, unspecified whether with hypoxia or hypercapnia: Secondary | ICD-10-CM | POA: Diagnosis present

## 2012-11-15 DIAGNOSIS — N059 Unspecified nephritic syndrome with unspecified morphologic changes: Secondary | ICD-10-CM | POA: Diagnosis present

## 2012-11-15 DIAGNOSIS — Z992 Dependence on renal dialysis: Secondary | ICD-10-CM

## 2012-11-15 DIAGNOSIS — J810 Acute pulmonary edema: Secondary | ICD-10-CM | POA: Diagnosis present

## 2012-11-15 DIAGNOSIS — Z79899 Other long term (current) drug therapy: Secondary | ICD-10-CM

## 2012-11-15 DIAGNOSIS — I10 Essential (primary) hypertension: Secondary | ICD-10-CM

## 2012-11-15 DIAGNOSIS — R7989 Other specified abnormal findings of blood chemistry: Secondary | ICD-10-CM | POA: Diagnosis present

## 2012-11-15 DIAGNOSIS — J9601 Acute respiratory failure with hypoxia: Secondary | ICD-10-CM | POA: Diagnosis present

## 2012-11-15 DIAGNOSIS — B20 Human immunodeficiency virus [HIV] disease: Secondary | ICD-10-CM | POA: Diagnosis present

## 2012-11-15 DIAGNOSIS — Z91158 Patient's noncompliance with renal dialysis for other reason: Secondary | ICD-10-CM

## 2012-11-15 DIAGNOSIS — N19 Unspecified kidney failure: Secondary | ICD-10-CM

## 2012-11-15 DIAGNOSIS — N186 End stage renal disease: Secondary | ICD-10-CM

## 2012-11-15 DIAGNOSIS — R042 Hemoptysis: Secondary | ICD-10-CM | POA: Diagnosis present

## 2012-11-15 DIAGNOSIS — K089 Disorder of teeth and supporting structures, unspecified: Secondary | ICD-10-CM | POA: Diagnosis present

## 2012-11-15 DIAGNOSIS — Z8661 Personal history of infections of the central nervous system: Secondary | ICD-10-CM

## 2012-11-15 DIAGNOSIS — I120 Hypertensive chronic kidney disease with stage 5 chronic kidney disease or end stage renal disease: Principal | ICD-10-CM | POA: Diagnosis present

## 2012-11-15 DIAGNOSIS — Z9115 Patient's noncompliance with renal dialysis: Secondary | ICD-10-CM

## 2012-11-15 DIAGNOSIS — D631 Anemia in chronic kidney disease: Secondary | ICD-10-CM | POA: Diagnosis present

## 2012-11-15 LAB — BASIC METABOLIC PANEL
CO2: 21 mEq/L (ref 19–32)
Chloride: 89 mEq/L — ABNORMAL LOW (ref 96–112)
Creatinine, Ser: 12.47 mg/dL — ABNORMAL HIGH (ref 0.50–1.10)
Sodium: 135 mEq/L (ref 135–145)

## 2012-11-15 LAB — CBC
MCV: 90.7 fL (ref 78.0–100.0)
Platelets: 205 10*3/uL (ref 150–400)
RBC: 3.21 MIL/uL — ABNORMAL LOW (ref 3.87–5.11)
WBC: 10.5 10*3/uL (ref 4.0–10.5)

## 2012-11-15 LAB — PRO B NATRIURETIC PEPTIDE: Pro B Natriuretic peptide (BNP): >70000 pg/mL — ABNORMAL HIGH (ref 0.0–100.0)

## 2012-11-15 LAB — POCT I-STAT TROPONIN I

## 2012-11-15 LAB — GLUCOSE, CAPILLARY: Glucose-Capillary: 101 mg/dL — ABNORMAL HIGH (ref 70–99)

## 2012-11-15 MED ORDER — NITROGLYCERIN IN D5W 200-5 MCG/ML-% IV SOLN
10.0000 ug/min | INTRAVENOUS | Status: DC
  Administered 2012-11-15: 10 ug/min via INTRAVENOUS

## 2012-11-15 MED ORDER — LIDOCAINE HCL (PF) 1 % IJ SOLN: 5.0000 mL | INTRAMUSCULAR | Status: DC | PRN

## 2012-11-15 MED ORDER — SODIUM CHLORIDE 0.9 % IV SOLN
1.0000 g | Freq: Once | INTRAVENOUS | Status: DC
  Filled 2012-11-15 (×2): qty 10

## 2012-11-15 MED ORDER — SODIUM CHLORIDE 0.9 % IV SOLN: 100.0000 mL | INTRAVENOUS | Status: DC | PRN

## 2012-11-15 MED ORDER — DEXTROSE 50 % IV SOLN
1.0000 | Freq: Once | INTRAVENOUS | Status: DC
  Filled 2012-11-15: qty 50

## 2012-11-15 MED ORDER — LIDOCAINE-PRILOCAINE 2.5-2.5 % EX CREA: 1.0000 "application " | TOPICAL_CREAM | CUTANEOUS | Status: DC | PRN

## 2012-11-15 MED ORDER — INSULIN ASPART 100 UNIT/ML IV SOLN
10.0000 [IU] | Freq: Once | INTRAVENOUS | Status: DC
  Filled 2012-11-15: qty 0.1

## 2012-11-15 MED ORDER — ONDANSETRON HCL 4 MG/2ML IJ SOLN
4.0000 mg | Freq: Once | INTRAMUSCULAR | Status: AC
  Administered 2012-11-15: 4 mg via INTRAVENOUS

## 2012-11-15 MED ORDER — NEPRO/CARBSTEADY PO LIQD
237.0000 mL | ORAL | Status: DC | PRN
  Filled 2012-11-15: qty 237

## 2012-11-15 MED ORDER — NITROGLYCERIN IN D5W 200-5 MCG/ML-% IV SOLN
INTRAVENOUS | Status: AC
  Administered 2012-11-15: 10 ug/min via INTRAVENOUS
  Filled 2012-11-15: qty 250

## 2012-11-15 MED ORDER — PENTAFLUOROPROP-TETRAFLUOROETH EX AERO: 1.0000 "application " | INHALATION_SPRAY | CUTANEOUS | Status: DC | PRN

## 2012-11-15 MED ORDER — ONDANSETRON HCL 4 MG/2ML IJ SOLN
INTRAMUSCULAR | Status: AC
  Administered 2012-11-15: 4 mg via INTRAVENOUS
  Filled 2012-11-15: qty 2

## 2012-11-15 NOTE — ED Notes (Signed)
Pt reports dialysis pt, last tx was Monday. Had one episode on Monday of vomiting small amount of blood. Then reports today approx 1 hour ago, onset of vomiting blood. spo2 90% at triage and pt hypertensive.

## 2012-11-15 NOTE — ED Notes (Addendum)
ED MD at bedside attempting IV using ultrasound. Awaiting IV team.

## 2012-11-15 NOTE — ED Notes (Signed)
C/o SOB, coughing up blood onset 1 hr ago. States missed dialysis Wed.

## 2012-11-15 NOTE — ED Notes (Addendum)
Reports rest of family has cold/cough, c/o cough x 4 days. 1 hr prior to arrival started coughing up copious amts bloody, frothy secretions. Upon arrival, resp very labored, tachypneic, very hypertensive. Pt reports did not take any of her daily meds today. Denies having any CP

## 2012-11-15 NOTE — H&P (Addendum)
PATIENT DETAILS Name: Bethany [name] Age: 52 y.o. Sex: female Date of Birth: 1960-11-27 Admit Date: 11/15/2012 MVH:QIONGEXB Not In System   CHIEF COMPLAINT:  Shortness of breath  HPI: Bethany [name] is a 52 y.o. female with a Past Medical History of end-stage renal disease on hemodialysis, HIV, hypertension who presents today with the above noted complaint. Patient has a long-standing history of HIV nephropathy, and recently moved to [facility], her usual hemodialysis days are Mondays Wednesdays and Fridays. She claimed that she having a toothache for the past 3 days, as a result she missed her hemodialysis on Wednesday. This afternoon, she started having shortness of breath, that rapidly worsened. She also claims that she started having a cough this afternoon, and started having some mild hemoptysis. She was brought to the emergency room, she was found to be very hypoxic, requiring 100% nonrebreather mask to maintain O2 saturation, chest x-ray was consistent with pulmonary edema, she also had uncontrolled hypertension. She was started on a nitroglycerin infusion for hypertension, nephrology was emergently consulted, patient currently undergoing hemodialysis during my evaluation. During my evaluation, she seemed much more comfortable, and now has been transitioned to a nasal cannula. She also claims that she felt much better. Cough is also much better. Blood pressure has come down significantly after starting hemodialysis, she is now being admitted further evaluation and treatment. During my evaluation, she denied any fever, chest pain, nausea. She denied any abdominal pain. She claims she recently moved to [place], and has not had a primary care practitioner established, nor has she established with a infectious disease physician.   ALLERGIES:   Allergies  Allergen Reactions  . Benadryl [Diphenhydramine Hcl]     jittery  . Latex Hives and Itching    PAST MEDICAL HISTORY: Past Medical  History  Diagnosis Date  . 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  Started HD in [place] in 2006, moved to [place] and rec'd dialysis in [place] starting in 2009.  Was receiving HD TTS schedule with a [facility].  Her nephrologist was Dr Vinnie Langton.  She moved to [facility] this week (Sep 29, 2012) and was not able to set up new HD unit prior to coming.  Last HD was Tuesday July 29th.  HD access is [facility].  Gets heparin at the center, dry weight was 56kg, ran 3 hours.     [name] HIV disease 09/29/2012    Diagnosed on 2000, on HIV meds.  On ART, viral levels undetectable per pt.     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    . Hypertension 09/29/2012    PAST SURGICAL HISTORY: History reviewed. No pertinent past surgical history.  MEDICATIONS AT HOME: Prior to Admission medications   Medication Sig Start Date End Date Taking? Authorizing Provider  abacavir (ZIAGEN) 300 MG tablet Take 300 mg by mouth 2 (two) times daily.    Historical Provider, MD  amLODipine (NORVASC) 10 MG tablet Take 10 mg by mouth daily.    Historical Provider, MD  cinacalcet (SENSIPAR) 30 MG tablet Take 30 mg by mouth at bedtime.    Historical Provider, MD  etravirine (INTELENCE) 100 MG tablet Take 200 mg by mouth 2 (two) times daily with a meal.    Historical Provider, MD  lisinopril (PRINIVIL,ZESTRIL) 20 MG tablet Take 1 tablet (20  mg total) by mouth daily. 10/02/12   Hartley Barefoot. Allena Katz, MD  metoprolol succinate (TOPROL-XL) 25 MG 24 hr tablet Take 1 tablet (25 mg total) by mouth at bedtime. 10/02/12   Hartley Barefoot. Allena Katz, MD  multivitamin (RENA-VIT) TABS tablet Take 1 tablet by mouth daily. 10/02/12   Hartley Barefoot. Allena Katz, MD  oxyCODONE-acetaminophen (ENDOCET) 5-325 MG per tablet Take 1 tablet by mouth every 4 (four) hours as needed for pain.    Historical Provider, MD  raltegravir (ISENTRESS) 400 MG tablet  Take 400 mg by mouth 2 (two) times daily.    Historical Provider, MD  sevelamer carbonate (RENVELA) 2.4 G PACK Take 2.4 g by mouth 3 (three) times daily with meals. 10/02/12   Hartley Barefoot. Allena Katz, MD  temazepam (RESTORIL) 15 MG capsule Take 15 mg by mouth at bedtime as needed for sleep.    Historical Provider, MD  tenofovir (VIREAD) 300 MG tablet Take 300 mg by mouth every 7 (seven) days.    Historical Provider, MD    FAMILY HISTORY: History reviewed. No pertinent family history.  SOCIAL HISTORY:  reports that she has never smoked. She does not have any smokeless tobacco history on file. She reports that she does not drink alcohol or use illicit drugs.  REVIEW OF SYSTEMS:  Constitutional:   No  weight loss, night sweats,  Fevers, chills, fatigue.  HEENT:    No headaches, Difficulty swallowing,Tooth/dental problems,Sore throat,  No sneezing, itching, ear ache, nasal congestion, post nasal drip,   Cardio-vascular: No chest pain,  Orthopnea, PND, swelling in lower extremities, anasarca, dizziness, palpitations  GI:  No heartburn, indigestion, abdominal pain, nausea, vomiting, diarrhea, change in  bowel habits, loss of appetite  Resp: No change in color of mucus.No wheezing.No chest wall deformity  Skin:  no rash or lesions.  GU:  no dysuria, change in color of urine, no urgency or frequency.  No flank pain.  Musculoskeletal: No joint pain or swelling.  No decreased range of motion.  No back pain.  Psych: No change in mood or affect. No depression or anxiety.  No memory loss.   PHYSICAL EXAM: Blood pressure 116/70, pulse 80, temperature 98.7 F (37.1 C), temperature source Oral, resp. rate 18, height 5\' 2"  (1.575 m), weight 56 kg (123 lb 7.3 oz), SpO2 90.00%.  General appearance :Awake, alert, not in any distress during dialysis. Speech Clear. Not toxic Looking HEENT: Atraumatic and Normocephalic, pupils equally reactive to light and accomodation Neck: supple, no JVD. No cervical  lymphadenopathy.  Chest:Good air entry bilaterally, some rales heard mostly in the bibasilar area CVS: S1 S2 regular, no murmurs.  Abdomen: Bowel sounds present, Non tender and not distended with no gaurding, rigidity or rebound. Extremities: B/L Lower Ext shows no edema, both legs are warm to touch Neurology: Awake alert, and oriented X 3, CN II-XII intact, Non focal Skin:No Rash Wounds:N/A  LABS ON ADMISSION:   Recent Labs  11/15/12 1805  NA 135  K 4.6  CL 89*  CO2 21  GLUCOSE 135*  BUN 78*  CREATININE 12.47*  CALCIUM 8.9   No results found for this basename: AST, ALT, ALKPHOS, BILITOT, PROT, ALBUMIN,  in the last 72 hours No results found for this basename: LIPASE, AMYLASE,  in the last 72 hours  Recent Labs  11/15/12 1805  WBC 10.5  HGB 9.3*  HCT 29.1*  MCV 90.7  PLT 205   No results found for this basename: CKTOTAL, CKMB, CKMBINDEX, TROPONINI,  in the last 72  hours No results found for this basename: DDIMER,  in the last 72 hours No components found with this basename: POCBNP,    RADIOLOGIC STUDIES ON ADMISSION: Dg Chest Port 1 View  11/15/2012   *RADIOLOGY REPORT*  Clinical Data: Shortness of breath, hemoptysis, dialysis patient, initial encounter.  PORTABLE CHEST - 1 VIEW  Comparison: 09/29/2012  Findings:  Grossly unchanged enlarged cardiac silhouette and mediastinal contours given patient rotation.  Pulmonary vasculature is indistinct with cephalization of flow.  Interval development of peri and infrahilar predominant heterogeneous air space opacities. Trace bilateral effusions not excluded.  No definite pneumothorax, though note, the right lung apex is occluded secondary to overlying chin.  Grossly unchanged bones.  IMPRESSION: Findings most compatible with alveolar pulmonary edema though note, underlying infection and/or hemorrhage is not excluded on the basis of this examination.   Original Report Authenticated By: Tacey Ruiz, MD     EKG: Independently  reviewed. Sinus tachycardia  ASSESSMENT AND PLAN: Present on Admission:  . Acute respiratory failure-hypoxic  - This is secondary to acute pulmonary edema in the setting of noncompliance to hemodialysis  - Do not think that there is any pneumonic component to this, suspect she will continued to feel better with hemodialysis.I will hold off on starting antibiotics at present, we'll repeat another x-ray in the morning, to see if pulmonary edema has cleared, if not, consider starting antibiotics.   . Acute pulmonary edema with hemoptysis - Secondary to missed dialysis.  - As above   . Hemoptysis - Suspect this is secondary to above, repeating x-ray in the morning to see if any parenchymal infiltrates persist after hemodialysis. If so, please consider further workup.  . Mild point-of-care troponin elevation - Suspect this is secondary to be demand ischemia-we'll cycle cardiac enzymes. - Further plan will depend as patient's clinical situation evolves, if her troponin continues to be elevated in subsequent readings, may  need to be placed on aspirin - Monitor in telemetry, and consider cardiology consultation if troponins are persistently elevated. - As noted above, currently with no chest pain  . Hypertensive emergency - Continue with nitroglycerin infusion, if blood pressure continues to improve-it will be stopped. - Resume oral antihypertensive medications  . HIV disease - Resume antiretroviral medications  - Do not see a CD4 panel, will order  - Needs to be established with the HIV clinic on discharge- please consult case management for this  Further plan will depend as patient's clinical course evolves and further radiologic and laboratory data become available. Patient will be monitored closely.  DVT Prophylaxis: SCD's for now given hemoptysis  Code Status: Full Code   Total time spent for admission equals 45 minutes.  [facility] Pager  [phone]  If 7PM-7AM, please contact night-coverage [email or web address] Password [facility] 11/15/2012, 9:27 PM

## 2012-11-15 NOTE — ED Notes (Signed)
RN states she wants the pt to rest before she is laid back to attempt rectal temp.

## 2012-11-15 NOTE — ED Provider Notes (Signed)
CSN: [id number]     Arrival date & time 11/15/12  1653 History   First MD Initiated Contact with Patient 11/15/12 1712     Chief Complaint  Patient presents with  . Hemoptysis  . Shortness of Breath   (Consider location/radiation/quality/duration/timing/severity/associated sxs/prior Treatment) Patient is a 52 y.o. female presenting with shortness of breath. The history is provided by the patient. No language interpreter was used.  Shortness of Breath Severity:  Severe Onset quality:  Sudden Timing:  Constant Progression:  Worsening Chronicity:  New Context: not URI   Context comment:  Missed dialysis yesterday Relieved by:  Oxygen Worsened by:  Exertion Ineffective treatments:  None tried Associated symptoms: cough and hemoptysis   Associated symptoms: no abdominal pain, no chest pain, no fever, no headaches, no rash, no sore throat and no vomiting   Risk factors: no hx of PE/DVT   Risk factors comment:  ESRD, HIV   Past Medical History  Diagnosis Date  . 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  Started HD in [place] in 2006, moved to [place] and rec'd dialysis in [place] starting in 2009.  Was receiving HD TTS schedule with a [facility].  Her nephrologist was Dr Vinnie Langton.  She moved to [facility] this week (Sep 29, 2012) and was not able to set up new HD unit prior to coming.  Last HD was Tuesday July 29th.  HD access is R FA AVF.  Gets heparin at the center, dry weight was 56kg, ran 3 hours.     [name] HIV disease 09/29/2012    Diagnosed on 2000, on HIV meds.  On ART, viral levels undetectable per pt.     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    . Hypertension 09/29/2012   History reviewed. No pertinent past surgical history. History reviewed. No pertinent family history. History  Substance Use Topics  . Smoking status: Not on file  .  Smokeless tobacco: Not on file  . Alcohol Use: No   OB History   Grav Para Term Preterm Abortions TAB SAB Ect Mult Living                 Review of Systems  Constitutional: Negative for fever.  HENT: Negative for congestion, sore throat and rhinorrhea.   Respiratory: Positive for cough, hemoptysis and shortness of breath.   Cardiovascular: Negative for chest pain.  Gastrointestinal: Negative for nausea, vomiting, abdominal pain and diarrhea.  Genitourinary: Negative for dysuria and hematuria.  Skin: Negative for rash.  Neurological: Negative for syncope, light-headedness and headaches.  All other systems reviewed and are negative.    Allergies  Benadryl and Latex  Home Medications   Current Outpatient Rx  Name  Route  Sig  Dispense  Refill  . abacavir (ZIAGEN) 300 MG tablet   Oral   Take 300 mg by mouth 2 (two) times daily.         [name] amLODipine (NORVASC) 10 MG tablet   Oral   Take 10 mg by mouth daily.         . cinacalcet (SENSIPAR) 30 MG tablet   Oral   Take 30 mg by mouth at bedtime.         [name] etravirine (INTELENCE) 100 MG tablet   Oral   Take 200 mg by mouth 2 (  two) times daily with a meal.         . lisinopril (PRINIVIL,ZESTRIL) 20 MG tablet   Oral   Take 1 tablet (20 mg total) by mouth daily.   30 tablet   6   . metoprolol succinate (TOPROL-XL) 25 MG 24 hr tablet   Oral   Take 1 tablet (25 mg total) by mouth at bedtime.   30 tablet   6   . multivitamin (RENA-VIT) TABS tablet   Oral   Take 1 tablet by mouth daily.   90 tablet   0   . oxyCODONE-acetaminophen (ENDOCET) 5-325 MG per tablet   Oral   Take 1 tablet by mouth every 4 (four) hours as needed for pain.         . raltegravir (ISENTRESS) 400 MG tablet   Oral   Take 400 mg by mouth 2 (two) times daily.         . sevelamer carbonate (RENVELA) 2.4 G PACK   Oral   Take 2.4 g by mouth 3 (three) times daily with meals.   90 each   6   . temazepam (RESTORIL) 15 MG capsule    Oral   Take 15 mg by mouth at bedtime as needed for sleep.         [name] tenofovir (VIREAD) 300 MG tablet   Oral   Take 300 mg by mouth every 7 (seven) days.          BP 237/182  Pulse 110  Temp(Src) 98.5 F (36.9 C) (Oral)  Resp 20  SpO2 91% Physical Exam  Nursing note and vitals reviewed. Constitutional: She is oriented to person, place, and time. She appears well-developed and well-nourished.  HENT:  Head: Normocephalic and atraumatic.  Right Ear: External ear normal.  Left Ear: External ear normal.  Eyes: EOM are normal. Pupils are equal, round, and reactive to light.  Neck: Normal range of motion. Neck supple. JVD present.  Cardiovascular: Normal rate, regular rhythm, normal heart sounds and intact distal pulses.  Exam reveals no gallop and no friction rub.   No murmur heard. Pulmonary/Chest: Effort normal. No respiratory distress. She has no wheezes. She has rales (bilaterally). She exhibits no tenderness.  Abdominal: Soft. Bowel sounds are normal. She exhibits no distension. There is no tenderness. There is no rebound.  Musculoskeletal: Normal range of motion. She exhibits no edema and no tenderness.  Lymphadenopathy:    She has no cervical adenopathy.  Neurological: She is alert and oriented to person, place, and time.  Skin: Skin is warm. No rash noted.  Psychiatric: She has a normal mood and affect. Her behavior is normal.    ED Course  Procedures (including critical care time) Labs Review Labs Reviewed  CBC - Abnormal; Notable for the following:    RBC 3.21 (*)    Hemoglobin 9.3 (*)    HCT 29.1 (*)    RDW 16.5 (*)    All other components within normal limits  BASIC METABOLIC PANEL - Abnormal; Notable for the following:    Chloride 89 (*)    Glucose, Bld 135 (*)    BUN 78 (*)    Creatinine, Ser 12.47 (*)    GFR calc non Af Amer 3 (*)    GFR calc Af Amer 4 (*)    All other components within normal limits  PRO B NATRIURETIC PEPTIDE - Abnormal; Notable for  the following:    Pro B Natriuretic peptide (BNP) >70000.0 (*)  All other components within normal limits  GLUCOSE, CAPILLARY - Abnormal; Notable for the following:    Glucose-Capillary 101 (*)    All other components within normal limits  POCT I-STAT TROPONIN I - Abnormal; Notable for the following:    Troponin i, poc 0.10 (*)    All other components within normal limits  PROTIME-INR  APTT  HEPATITIS B SURFACE ANTIGEN   Imaging Review Dg Chest Port 1 View  11/15/2012   *RADIOLOGY REPORT*  Clinical Data: Shortness of breath, hemoptysis, dialysis patient, initial encounter.  PORTABLE CHEST - 1 VIEW  Comparison: 09/29/2012  Findings:  Grossly unchanged enlarged cardiac silhouette and mediastinal contours given patient rotation.  Pulmonary vasculature is indistinct with cephalization of flow.  Interval development of peri and infrahilar predominant heterogeneous air space opacities. Trace bilateral effusions not excluded.  No definite pneumothorax, though note, the right lung apex is occluded secondary to overlying chin.  Grossly unchanged bones.  IMPRESSION: Findings most compatible with alveolar pulmonary edema though note, underlying infection and/or hemorrhage is not excluded on the basis of this examination.   Original Report Authenticated By: Tacey Ruiz, MD    MDM   1. Acute respiratory failure   2. Acute pulmonary edema   3. ESRD on hemodialysis   4. HIV disease    5:12 PM Pt is a 52 y.o. female with pertinent PMHX of HIV compliant on medications, HTN, ESRD MWF who presents to the ED with hematemesis, acute volume overload and shortness of breath. Pt missed Wednesday's dialysis secondary to pain. Today worsening shortness of breath with hematemesis. No fevers. No sick contacts. No syncope. Denies IVDA. Goes to [facility] dialysis.  On exam: AF,hypertensive tachypnea, rales bilaterally JVD concerning for acute overload given pt missed dialysis. AV fistula good thrill. Plan to  obtain CXR AP portable, CBC, EKG, CBC, BMP, BNP, will start insulin, D50 and give 1 G Calcium. Will also start nitro drip for hypertension. Will also consult nephrology.  EKG personally reviewed by myself showed sinus tachycardia, peaked T-waves Rate of 114, PR , QRS 88ms QT/QTC 312/469ms, normal axis, without evidence of new ischemia. Comparison showed NSR without peaked t waves, indication: shortness of breath  CXR AP portable for shortness of breath per my read showed concerning signs for acute fluid overload  Review of labs: coags within normal limits. BNP >700000, BMP showed no hypochloremia, BUN 78, Cr 12.47. CBC showed no leukocytosis, H&H 9.3/29.1. Istat troponin 0.10. Will consult nephrology for urgent dialysis and hospitalist for admission for acute respiratory failure  The patient appears reasonably stabilized for admission considering the current resources, flow, and capabilities available in the ED at this time, and I doubt any other [facility] requiring further screening and/or treatment in the ED prior to admission.   Plan for admission to hospitalist for further evaluation and management. PT transferred to step down VSS.  Labs, EKG and imaging reviewed by myself and considered in medical decision making if ordered.  Imaging interpreted by radiology. Pt was discussed with my attending, Dr. Kavin Leech, MD 11/16/12 [phone]

## 2012-11-15 NOTE — Consult Note (Signed)
Referring Provider: No ref. provider found Primary Care Physician:  Provider Not In System Primary Nephrologist: Dr Kathrene Bongo  Reason for Consultation:  Medical management of ESRD. Pulmonary edema. Hypertension.  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 Missed treatments last week and this week due to a toothache. She presented to the ER with acute dyspnea and coughing up flakes of hemoptysis. She has been admitted for emergent dialysis. She was placed on non rebreather mask and supplemental oxygen.  Past Medical History  Diagnosis Date  . 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  Started HD in [place] in 2006, moved to [place] and rec'd dialysis in [place] starting in 2009.  Was receiving HD TTS schedule with a [facility].  Her nephrologist was Dr Vinnie Langton.  She moved to [facility] this week (Sep 29, 2012) and was not able to set up new HD unit prior to coming.  Last HD was Tuesday July 29th.  HD access is R FA AVF.  Gets heparin at the center, dry weight was 56kg, ran 3 hours.     [name] HIV disease 09/29/2012    Diagnosed on 2000, on HIV meds.  On ART, viral levels undetectable per pt.     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    . Hypertension 09/29/2012    History reviewed. No pertinent past surgical history.  Prior to Admission medications   Medication Sig Start Date End Date Taking? Authorizing Provider  abacavir (ZIAGEN) 300 MG tablet Take 300 mg by mouth 2 (two) times daily.    Historical Provider, MD  amLODipine (NORVASC) 10 MG tablet Take 10 mg by mouth daily.    Historical Provider, MD  cinacalcet (SENSIPAR) 30 MG tablet Take 30 mg by mouth at bedtime.    Historical Provider, MD  etravirine (INTELENCE) 100 MG tablet Take 200 mg by mouth 2 (two) times  daily with a meal.    Historical Provider, MD  lisinopril (PRINIVIL,ZESTRIL) 20 MG tablet Take 1 tablet (20 mg total) by mouth daily. 10/02/12   Hartley Barefoot. Allena Katz, MD  metoprolol succinate (TOPROL-XL) 25 MG 24 hr tablet Take 1 tablet (25 mg total) by mouth at bedtime. 10/02/12   Hartley Barefoot. Allena Katz, MD  multivitamin (RENA-VIT) TABS tablet Take 1 tablet by mouth daily. 10/02/12   Hartley Barefoot. Allena Katz, MD  oxyCODONE-acetaminophen (ENDOCET) 5-325 MG per tablet Take 1 tablet by mouth every 4 (four) hours as needed for pain.    Historical Provider, MD  raltegravir (ISENTRESS) 400 MG tablet Take 400 mg by mouth 2 (two) times daily.    Historical Provider, MD  sevelamer carbonate (RENVELA) 2.4 G PACK Take 2.4 g by mouth 3 (three) times daily with meals. 10/02/12   Hartley Barefoot. Allena Katz, MD  temazepam (RESTORIL) 15 MG capsule Take 15 mg by mouth at bedtime as needed for sleep.    Historical Provider, MD  tenofovir (VIREAD) 300 MG tablet Take 300 mg by mouth every 7 (seven) days.    Historical Provider, MD    Current Facility-Administered Medications  Medication Dose Route Frequency Provider Last Rate Last Dose  . calcium chloride 1 g in sodium chloride 0.9 % 100 mL IVPB  1 g Intravenous Once Fredirick Lathe, MD      .  dextrose 50 % solution 50 mL  1 ampule Intravenous Once Fredirick Lathe, MD      . insulin aspart (novoLOG) injection 10 Units  10 Units Intravenous Once Fredirick Lathe, MD      . nitroGLYCERIN 0.2 mg/mL in dextrose 5 % infusion  10-200 mcg/min Intravenous Titrated Audree Camel, MD 18 mL/hr at 11/15/12 1851 60 mcg/min at 11/15/12 1851   Current Outpatient Prescriptions  Medication Sig Dispense Refill  . abacavir (ZIAGEN) 300 MG tablet Take 300 mg by mouth 2 (two) times daily.      [name] amLODipine (NORVASC) 10 MG tablet Take 10 mg by mouth daily.      . cinacalcet (SENSIPAR) 30 MG tablet Take 30 mg by mouth at bedtime.      [name] etravirine (INTELENCE) 100 MG tablet Take 200 mg by mouth 2 (two) times daily with a meal.       . lisinopril (PRINIVIL,ZESTRIL) 20 MG tablet Take 1 tablet (20 mg total) by mouth daily.  30 tablet  6  . metoprolol succinate (TOPROL-XL) 25 MG 24 hr tablet Take 1 tablet (25 mg total) by mouth at bedtime.  30 tablet  6  . multivitamin (RENA-VIT) TABS tablet Take 1 tablet by mouth daily.  90 tablet  0  . oxyCODONE-acetaminophen (ENDOCET) 5-325 MG per tablet Take 1 tablet by mouth every 4 (four) hours as needed for pain.      . raltegravir (ISENTRESS) 400 MG tablet Take 400 mg by mouth 2 (two) times daily.      . sevelamer carbonate (RENVELA) 2.4 G PACK Take 2.4 g by mouth 3 (three) times daily with meals.  90 each  6  . temazepam (RESTORIL) 15 MG capsule Take 15 mg by mouth at bedtime as needed for sleep.      [name] tenofovir (VIREAD) 300 MG tablet Take 300 mg by mouth every 7 (seven) days.        Allergies as of 11/15/2012 - Review Complete 11/15/2012  Allergen Reaction Noted  . Benadryl [diphenhydramine hcl]  09/29/2012  . Latex Hives and Itching 09/29/2012    History reviewed. No pertinent family history.  History   Social History  . Marital Status: Married    Spouse Name: N/A    Number of Children: N/A  . Years of Education: N/A   Occupational History  . Not on file.   Social History Main Topics  . Smoking status: Never Smoker   . Smokeless tobacco: Not on file  . Alcohol Use: No  . Drug Use: No  . Sexual Activity: Not on file   Other Topics Concern  . Not on file   Social History Narrative  . No narrative on file    Review of Systems: Gen: Denies any fever, chills, sweats, anorexia, fatigue, weakness, malaise, weight loss, and sleep disorder HEENT: No visual complaints, No history of Retinopathy. Normal external appearance No Epistaxis or Sore throat. No sinusitis.   CV: Denies chest pain, angina, palpitations, syncope, orthopnea, PND, peripheral edema, and claudication. Resp: + dyspnea at rest,  + dyspnea with exercise,  + cough, + coughing up blood GI: Denies  vomiting blood, jaundice, and fecal incontinence.   Denies dysphagia or odynophagia. GU : Anuric MS: Denies joint pain, limitation of movement, and swelling, stiffness, low back pain, extremity pain. Denies muscle weakness, cramps, atrophy.  No use of non steroidal antiinflammatory drugs. Derm: Denies rash, itching, dry skin, hives, moles, warts, or unhealing ulcers.  Psych: Denies depression,  anxiety, memory loss, suicidal ideation, hallucinations, paranoia, and confusion. Heme: Denies bruising, bleeding, and enlarged lymph nodes. Neuro: No headache.  No diplopia. No dysarthria.  No dysphasia.  No history of CVA.  No Seizures. No paresthesias.  No weakness. Endocrine No DM.  No Thyroid disease.  No Adrenal disease.  Physical Exam: Vital signs in last 24 hours: Temp:  [98.5 F (36.9 C)-98.7 F (37.1 C)] 98.7 F (37.1 C) (09/18 1930) Pulse Rate:  [82-110] 82 (09/18 2030) Resp:  [19-45] 19 (09/18 2030) BP: (157-259)/(83-182) 159/83 mmHg (09/18 2030) SpO2:  [84 %-100 %] 98 % (09/18 2030) FiO2 (%):  [100 %] 100 % (09/18 1850) Weight:  [56 kg (123 lb 7.3 oz)] 56 kg (123 lb 7.3 oz) (09/18 1713)   General:   Chronically ill Head:  Normocephalic and atraumatic. Eyes:  Sclera clear, no icterus.   Conjunctiva pink. Ears:  Normal auditory acuity. Nose:  No deformity, discharge,  or lesions. Mouth:  No deformity or lesions, dentition normal. Neck:  Supple; no masses or thyromegaly. JVP elevated Lungs:  Rhonchi throughout Heart:  Regular rate and rhythm; no murmurs, clicks, rubs,  or gallops. Abdomen:  Soft, nontender and nondistended. No masses, hepatosplenomegaly or hernias noted. Normal bowel sounds, without guarding, and without rebound.   Msk:  Symmetrical without gross deformities. Normal posture. Pulses:  No carotid, renal, femoral bruits. DP and PT symmetrical and equal Extremities:  Without clubbing or edema. Neurologic:  Alert and  oriented x4;  grossly normal neurologically. Skin:   Intact without significant lesions or rashes. Cervical Nodes:  No significant cervical adenopathy. Psych:  Alert and cooperative. Normal mood and affect.  Intake/Output from previous day:   Intake/Output this shift:    Lab Results:  Recent Labs  11/15/12 1805  WBC 10.5  HGB 9.3*  HCT 29.1*  PLT 205   BMET  Recent Labs  11/15/12 1805  NA 135  K 4.6  CL 89*  CO2 21  GLUCOSE 135*  BUN 78*  CREATININE 12.47*  CALCIUM 8.9   LFT No results found for this basename: PROT, ALBUMIN, AST, ALT, ALKPHOS, BILITOT, BILIDIR, IBILI,  in the last 72 hours PT/INR  Recent Labs  11/15/12 1805  LABPROT 13.1  INR 1.01   Hepatitis Panel No results found for this basename: HEPBSAG, HCVAB, HEPAIGM, HEPBIGM,  in the last 72 hours  Studies/Results: Dg Chest Port 1 View  11/15/2012   *RADIOLOGY REPORT*  Clinical Data: Shortness of breath, hemoptysis, dialysis patient, initial encounter.  PORTABLE CHEST - 1 VIEW  Comparison: 09/29/2012  Findings:  Grossly unchanged enlarged cardiac silhouette and mediastinal contours given patient rotation.  Pulmonary vasculature is indistinct with cephalization of flow.  Interval development of peri and infrahilar predominant heterogeneous air space opacities. Trace bilateral effusions not excluded.  No definite pneumothorax, though note, the right lung apex is occluded secondary to overlying chin.  Grossly unchanged bones.  IMPRESSION: Findings most compatible with alveolar pulmonary edema though note, underlying infection and/or hemorrhage is not excluded on the basis of this examination.   Original Report Authenticated By: Tacey Ruiz, MD    Assessment/Plan: 1. ESRD  MWF missed dialysis Wednesday and presented with acute dyspnea  2. Anemia will give aranesp at next dialysis session  3. Bones binders and Vitamin D  4. Pulmonary Edema will remove volume with dialysis   I have seen and examined this patient and agree with the plan of care. Patient  presents with acute dyspnea Evaluation for management of  ESRD. Vitals are stable with no distress and volume overloaded having missed dialysis. Evaluation of out patient records are unavailable at this time.She receives erythropoietin and iron to keep her Hb above 10 and IV Vitamin D and phosphate binders to keep his Bone mineral goals at [facility]. She has a functioning AVF with no problems during usage. We shall proceed with in hospital emergency dialysis.   LOS: 0 Malillany Kazlauskas W @TODAY @8 :50 PM

## 2012-11-16 ENCOUNTER — Inpatient Hospital Stay (HOSPITAL_COMMUNITY): Payer: Medicare Other

## 2012-11-16 ENCOUNTER — Encounter (HOSPITAL_COMMUNITY): Payer: Self-pay

## 2012-11-16 DIAGNOSIS — Z9115 Patient's noncompliance with renal dialysis: Secondary | ICD-10-CM

## 2012-11-16 DIAGNOSIS — I359 Nonrheumatic aortic valve disorder, unspecified: Secondary | ICD-10-CM

## 2012-11-16 DIAGNOSIS — D631 Anemia in chronic kidney disease: Secondary | ICD-10-CM | POA: Diagnosis present

## 2012-11-16 LAB — COMPREHENSIVE METABOLIC PANEL
AST: 25 U/L (ref 0–37)
Albumin: 3.8 g/dL (ref 3.5–5.2)
CO2: 31 mEq/L (ref 19–32)
Calcium: 9.3 mg/dL (ref 8.4–10.5)
Creatinine, Ser: 5.87 mg/dL — ABNORMAL HIGH (ref 0.50–1.10)
GFR calc non Af Amer: 8 mL/min — ABNORMAL LOW (ref >90)

## 2012-11-16 LAB — RENAL FUNCTION PANEL
CO2: 30 mEq/L (ref 19–32)
Calcium: 8.7 mg/dL (ref 8.4–10.5)
Chloride: 91 mEq/L — ABNORMAL LOW (ref 96–112)
Creatinine, Ser: 6.51 mg/dL — ABNORMAL HIGH (ref 0.50–1.10)
GFR calc Af Amer: 8 mL/min — ABNORMAL LOW (ref >90)
GFR calc non Af Amer: 7 mL/min — ABNORMAL LOW (ref >90)
Glucose, Bld: 125 mg/dL — ABNORMAL HIGH (ref 70–99)

## 2012-11-16 LAB — CBC
MCH: 28.5 pg (ref 26.0–34.0)
MCHC: 31.5 g/dL (ref 30.0–36.0)
MCV: 90.6 fL (ref 78.0–100.0)
Platelets: 160 10*3/uL (ref 150–400)
RDW: 16.7 % — ABNORMAL HIGH (ref 11.5–15.5)

## 2012-11-16 LAB — TROPONIN I
Troponin I: 0.85 ng/mL (ref <0.30)
Troponin I: 0.71 ng/mL (ref <0.30)

## 2012-11-16 LAB — HEPATITIS B SURFACE ANTIGEN: Hepatitis B Surface Ag: NEGATIVE

## 2012-11-16 LAB — LACTATE DEHYDROGENASE: LDH: 392 U/L — ABNORMAL HIGH (ref 94–250)

## 2012-11-16 MED ORDER — DOXERCALCIFEROL 4 MCG/2ML IV SOLN
4.0000 ug | INTRAVENOUS | Status: DC
  Administered 2012-11-16: 4 ug via INTRAVENOUS
  Filled 2012-11-16: qty 2

## 2012-11-16 MED ORDER — RALTEGRAVIR POTASSIUM 400 MG PO TABS
400.0000 mg | ORAL_TABLET | Freq: Two times a day (BID) | ORAL | Status: DC
  Administered 2012-11-16 – 2012-11-17 (×4): 400 mg via ORAL
  Filled 2012-11-16 (×5): qty 1

## 2012-11-16 MED ORDER — ASPIRIN 325 MG PO TABS
325.0000 mg | ORAL_TABLET | Freq: Every day | ORAL | Status: DC
  Administered 2012-11-16 – 2012-11-17 (×2): 325 mg via ORAL
  Filled 2012-11-16 (×2): qty 1

## 2012-11-16 MED ORDER — AMLODIPINE BESYLATE 10 MG PO TABS: 10.0000 mg | ORAL_TABLET | Freq: Every day | ORAL | Status: DC

## 2012-11-16 MED ORDER — MORPHINE SULFATE 2 MG/ML IJ SOLN
1.0000 mg | INTRAMUSCULAR | Status: DC | PRN
  Administered 2012-11-16 – 2012-11-17 (×2): 1 mg via INTRAVENOUS
  Filled 2012-11-16 (×2): qty 1

## 2012-11-16 MED ORDER — SEVELAMER CARBONATE 2.4 G PO PACK
2.4000 g | PACK | Freq: Three times a day (TID) | ORAL | Status: DC
  Administered 2012-11-16 (×3): 2.4 g via ORAL
  Filled 2012-11-16 (×7): qty 1

## 2012-11-16 MED ORDER — DARBEPOETIN ALFA-POLYSORBATE 40 MCG/0.4ML IJ SOLN
40.0000 ug | INTRAMUSCULAR | Status: DC
  Administered 2012-11-16: 40 ug via INTRAVENOUS
  Filled 2012-11-16: qty 0.4

## 2012-11-16 MED ORDER — HYDRALAZINE HCL 20 MG/ML IJ SOLN
10.0000 mg | Freq: Four times a day (QID) | INTRAMUSCULAR | Status: DC | PRN
  Administered 2012-11-16 – 2012-11-17 (×2): 10 mg via INTRAVENOUS
  Filled 2012-11-16 (×3): qty 1

## 2012-11-16 MED ORDER — DOXERCALCIFEROL 4 MCG/2ML IV SOLN
INTRAVENOUS | Status: AC
  Administered 2012-11-16: 4 ug via INTRAVENOUS
  Filled 2012-11-16: qty 2

## 2012-11-16 MED ORDER — HEPARIN SODIUM (PORCINE) 1000 UNIT/ML IJ SOLN
3000.0000 [IU] | Freq: Once | INTRAMUSCULAR | Status: AC
  Administered 2012-11-16: 3000 [IU] via INTRAVENOUS

## 2012-11-16 MED ORDER — TENOFOVIR DISOPROXIL FUMARATE 300 MG PO TABS
300.0000 mg | ORAL_TABLET | ORAL | Status: DC
  Administered 2012-11-17: 300 mg via ORAL
  Filled 2012-11-16: qty 1

## 2012-11-16 MED ORDER — OXYCODONE-ACETAMINOPHEN 5-325 MG PO TABS
ORAL_TABLET | ORAL | Status: AC
  Administered 2012-11-16: 1 via ORAL
  Filled 2012-11-16: qty 1

## 2012-11-16 MED ORDER — DARBEPOETIN ALFA-POLYSORBATE 40 MCG/0.4ML IJ SOLN
INTRAMUSCULAR | Status: AC
  Administered 2012-11-16: 40 ug via INTRAVENOUS
  Filled 2012-11-16: qty 0.4

## 2012-11-16 MED ORDER — OXYCODONE-ACETAMINOPHEN 5-325 MG PO TABS
1.0000 | ORAL_TABLET | ORAL | Status: DC | PRN
  Administered 2012-11-16 (×3): 1 via ORAL
  Filled 2012-11-16 (×3): qty 1

## 2012-11-16 MED ORDER — LISINOPRIL 20 MG PO TABS: 20.0000 mg | ORAL_TABLET | Freq: Every day | ORAL | Status: DC

## 2012-11-16 MED ORDER — RENA-VITE PO TABS
1.0000 | ORAL_TABLET | Freq: Every day | ORAL | Status: DC
  Administered 2012-11-16 (×2): 1 via ORAL
  Filled 2012-11-16 (×3): qty 1

## 2012-11-16 MED ORDER — METOPROLOL SUCCINATE ER 50 MG PO TB24
50.0000 mg | ORAL_TABLET | Freq: Every day | ORAL | Status: DC
  Administered 2012-11-16: 50 mg via ORAL
  Filled 2012-11-16 (×2): qty 1

## 2012-11-16 MED ORDER — ALBUTEROL SULFATE (5 MG/ML) 0.5% IN NEBU: 2.5000 mg | INHALATION_SOLUTION | RESPIRATORY_TRACT | Status: DC | PRN

## 2012-11-16 MED ORDER — TEMAZEPAM 15 MG PO CAPS
15.0000 mg | ORAL_CAPSULE | Freq: Every evening | ORAL | Status: DC | PRN
  Administered 2012-11-16: 15 mg via ORAL
  Filled 2012-11-16 (×2): qty 1

## 2012-11-16 MED ORDER — ABACAVIR SULFATE 300 MG PO TABS
300.0000 mg | ORAL_TABLET | Freq: Two times a day (BID) | ORAL | Status: DC
  Administered 2012-11-16 – 2012-11-17 (×4): 300 mg via ORAL
  Filled 2012-11-16 (×5): qty 1

## 2012-11-16 MED ORDER — ACETAMINOPHEN 325 MG PO TABS: 650.0000 mg | ORAL_TABLET | Freq: Four times a day (QID) | ORAL | Status: DC | PRN

## 2012-11-16 MED ORDER — ETRAVIRINE 100 MG PO TABS
200.0000 mg | ORAL_TABLET | Freq: Two times a day (BID) | ORAL | Status: DC
  Administered 2012-11-16 – 2012-11-17 (×3): 200 mg via ORAL
  Filled 2012-11-16 (×5): qty 2

## 2012-11-16 MED ORDER — AMLODIPINE BESYLATE 10 MG PO TABS
10.0000 mg | ORAL_TABLET | Freq: Every day | ORAL | Status: DC
  Administered 2012-11-16 – 2012-11-17 (×2): 10 mg via ORAL
  Filled 2012-11-16 (×2): qty 1

## 2012-11-16 MED ORDER — CINACALCET HCL 30 MG PO TABS
30.0000 mg | ORAL_TABLET | Freq: Every day | ORAL | Status: DC
  Administered 2012-11-16 (×2): 30 mg via ORAL
  Filled 2012-11-16 (×3): qty 1

## 2012-11-16 MED ORDER — ACETAMINOPHEN 650 MG RE SUPP: 650.0000 mg | Freq: Four times a day (QID) | RECTAL | Status: DC | PRN

## 2012-11-16 MED ORDER — LISINOPRIL 20 MG PO TABS
20.0000 mg | ORAL_TABLET | Freq: Every day | ORAL | Status: DC
  Administered 2012-11-16 – 2012-11-17 (×2): 20 mg via ORAL
  Filled 2012-11-16 (×2): qty 1

## 2012-11-16 MED ORDER — METOPROLOL SUCCINATE ER 25 MG PO TB24
25.0000 mg | ORAL_TABLET | Freq: Every day | ORAL | Status: DC
  Administered 2012-11-16: 25 mg via ORAL
  Filled 2012-11-16 (×2): qty 1

## 2012-11-16 NOTE — Care Management Note (Signed)
    Page 1 of 1   11/16/2012     8:49:42 AM   CARE MANAGEMENT NOTE 11/16/2012  Patient:  VERNIDA, MCNICHOLAS   Account Number:  [id number]  Date Initiated:  11/16/2012  Documentation initiated by:  Junius Creamer  Subjective/Objective Assessment:   adm w resp failur     Action/Plan:   lives w husband, esrd-hd   Anticipated DC Date:     Anticipated DC Plan:        DC Planning Services  CM consult      Choice offered to / List presented to:             Status of service:   Medicare Important Message given?   (If response is "NO", the following Medicare IM given date fields will be blank) Date Medicare IM given:   Date Additional Medicare IM given:    Discharge Disposition:    Per UR Regulation:  Reviewed for med. necessity/level of care/duration of stay  If discussed at Long Length of Stay Meetings, dates discussed:    Comments:

## 2012-11-16 NOTE — Progress Notes (Signed)
MD notified of critical troponin 0.67. New orders received.

## 2012-11-16 NOTE — ED Provider Notes (Signed)
I saw and evaluated the patient, reviewed the resident's note and I agree with the findings and plan.   Patient with acute resp distress and small amounts of hemoptysis. Due to this I do not feel it is safe to place on BiPAP for fluid overload from missed dialysis. However, after obtaining an IV and placed on nitro gtt her BP downtrended to 200 systolic and her resp distress resolved. Discussed with nephrology and hospitalist, will get acute/emergent dialysis and admit to hospitalist. Do not feel she has superimposed PNA, her sx are most c/w pulm edema. Presumptively treated for hyperkalemia based on EKG and missed dialysis.  CRITICAL CARE Performed by: Pricilla Loveless T   Total critical care time: 30 minutes  Critical care time was exclusive of separately billable procedures and treating other patients.  Critical care was necessary to treat or prevent imminent or life-threatening deterioration.  Critical care was time spent personally by me on the following activities: development of treatment plan with patient and/or surrogate as well as nursing, discussions with consultants, evaluation of patient's response to treatment, examination of patient, obtaining history from patient or surrogate, ordering and performing treatments and interventions, ordering and review of laboratory studies, ordering and review of radiographic studies, pulse oximetry and re-evaluation of patient's condition.   Angiocath insertion Performed by: Pricilla Loveless T  Consent: Verbal consent obtained. Risks and benefits: risks, benefits and alternatives were discussed Time out: Immediately prior to procedure a "time out" was called to verify the correct patient, procedure, equipment, support staff and site/side marked as required.  Preparation: Patient was prepped and draped in the usual sterile fashion.  Vein Location: left basilic vein  Ultrasound Guided  Gauge: 20  Normal blood return and flush without  difficulty Patient tolerance: Patient tolerated the procedure well with no immediate complications.     Audree Camel, MD 11/16/12 [phone]

## 2012-11-16 NOTE — Progress Notes (Signed)
11/16/12 1810 nsg To unit 5W per bed accompanied by NT from HD alert and oriented patient; placed on telemetry per order. Flaky and dry skin noted ; no other skin issues noted. Oriented to unit set-up.

## 2012-11-16 NOTE — Progress Notes (Signed)
Subjective: feels much better. Breathing better, no further hemoptysis  Objective Vital signs in last 24 hours: Filed Vitals:   11/16/12 0500 11/16/12 0630 11/16/12 0700 11/16/12 0800  BP: 192/84 181/78 166/78 152/77  Pulse: 89 88 89 87  Temp:    99.2 F (37.3 C)  TempSrc:    Oral  Resp:   25   Height:      Weight:      SpO2: 95% 95% 92% 94%   Weight change:   Intake/Output Summary (Last 24 hours) at 11/16/12 1112 Last data filed at 11/16/12 0900  Gross per 24 hour  Intake    540 ml  Output   3455 ml  Net  -2915 ml   Physical Exam:  Blood pressure 152/77, pulse 87, temperature 99.2 F (37.3 C), temperature source Oral, resp. rate 25, height 5\' 2"  (1.575 m), weight 58.3 kg (128 lb 8.5 oz), SpO2 94.00%.  Weight trending: 11/16/17 58.3 (pre HD)  Middle aged BF NAD Lungs with few crackles right bas only otherwise clear Tachy 100 S1S2 +S4 PMI lat and inf dsplaced  1/6 murmr ULSB Abdomen soft and non tender No LE edema Right AVF with good bruit and thrill  Basic Metabolic Panel:  Recent Labs Lab 11/15/12 1805 11/16/12 0641  NA 135 139  K 4.6 4.8  CL 89* 93*  CO2 21 31  GLUCOSE 135* 96  BUN 78* 26*  CREATININE 12.47* 5.87*  CALCIUM 8.9 9.3   Liver Function Tests:  Recent Labs Lab 11/16/12 0641  AST 25  ALT 23  ALKPHOS 105  BILITOT 0.5  PROT 7.5  ALBUMIN 3.8   Recent Labs Lab 11/15/12 1805 11/16/12 0641  WBC 10.5 7.6  HGB 9.3* 8.5*  HCT 29.1* 27.0*  MCV 90.7 90.6  PLT 205 160   Recent Labs Lab 11/16/12 0120 11/16/12 0641  TROPONINI 0.67* 0.85*   CBG:  Recent Labs Lab 11/15/12 1832 11/16/12 0815  GLUCAP 101* 85   Studies/Results: Dg Chest Port 1 View  11/16/2012   *RADIOLOGY REPORT*  Clinical Data: Shortness of breath  PORTABLE CHEST - 1 VIEW  Comparison: Chest radiograph 11/15/2012  Findings: Stable enlarged cardiac and mediastinal contours. Interval improvement in bilateral mid and lower lung heterogeneous opacities.  No definite  pleural effusion or pneumothorax.  Regional skeleton is unremarkable.  IMPRESSION: Slight interval improvement in bilateral mid and lower lung opacities likely representing improving pulmonary edema.   Original Report Authenticated By: Annia Belt, M.D   Dg Chest Port 1 View  11/15/2012   *RADIOLOGY REPORT*  Clinical Data: Shortness of breath, hemoptysis, dialysis patient, initial encounter.  PORTABLE CHEST - 1 VIEW  Comparison: 09/29/2012  Findings:  Grossly unchanged enlarged cardiac silhouette and mediastinal contours given patient rotation.  Pulmonary vasculature is indistinct with cephalization of flow.  Interval development of peri and infrahilar predominant heterogeneous air space opacities. Trace bilateral effusions not excluded.  No definite pneumothorax, though note, the right lung apex is occluded secondary to overlying chin.  Grossly unchanged bones.  IMPRESSION: Findings most compatible with alveolar pulmonary edema though note, underlying infection and/or hemorrhage is not excluded on the basis of this examination.   Original Report Authenticated By: Tacey Ruiz, MD   Medications:   . abacavir  300 mg Oral BID  . amLODipine  10 mg Oral Daily  . aspirin  325 mg Oral Daily  . cinacalcet  30 mg Oral QHS  . darbepoetin (ARANESP) injection - DIALYSIS  40 mcg Intravenous Q  Fri-HD  . doxercalciferol  4 mcg Intravenous Q M,W,F-HD  . etravirine  200 mg Oral BID WC  . lisinopril  20 mg Oral Daily  . metoprolol succinate  25 mg Oral QHS  . multivitamin  1 tablet Oral QHS  . raltegravir  400 mg Oral BID  . sevelamer carbonate  2.4 g Oral TID WC  . [START ON 11/17/2012] tenofovir  300 mg Oral Q Sat   I  have reviewed scheduled and prn medications.  ASSESSMENT/PLAN  1. ESRD s/p dialysis for acute pulmonary edema/hemoptysis (as result of of missed dialysis, uncontrolled hypertension).  Clinically improved. CXR improved. Today is regular dialysis day. Plan 3 hour treatment to get back on MWF  schedule. Pull 2.3 liters to EDW 2. Anemia resume ESA was on 3000 EPO TIW as outpt.  Last Hb outpt 0on 9/12 was 8.1. Increase to 4200 EPO equivalent (Aranesp 60) and give today 3. CKD-MBD continue binder. Check phosphorus. Continue outpt hectorol. 4. Elevated troponins.  Suspect demand ischemia but actually a little higher today.  Marked cardiomegaly on CXR. Echo would be reasonable. Have ordered 5. HIV - continue meds. Primary indicated they ordered a CD4 - do not see order - will obtain 6. HTN - continue meds. Dosing may need to be higher.(Not sure about med compliance)  Camille Bal, MD [facility] [phone] pager 11/16/2012, 11:12 AM

## 2012-11-16 NOTE — Progress Notes (Signed)
  Echocardiogram 2D Echocardiogram has been performed.  Georgian Co 11/16/2012, 12:38 PM

## 2012-11-16 NOTE — Procedures (Signed)
Patient receiving hemodialysis today for additional UF to EDW and to get back on regular MWF schedule. Pre HD weight  59.6 with EDW of 56 kg. AVF 450 BFR. K 4.7 2K bath/cbd

## 2012-11-16 NOTE — Progress Notes (Signed)
[facility] Progress Note [place] TEAM 1 - Stepdown ICU Team   Indica Bethany [name] JXB:[id number] DOB: 1960/06/14 DOA: 11/15/2012 PCP: Provider Not In System  Brief narrative: 52 y.o. female with a Past Medical History of end-stage renal disease on hemodialysis, HIV, hypertension who presented with SOB.  Patient has a long-standing history of HIV nephropathy, and moved to [facility] just before August 2014.  Her usual hemodialysis days are Mondays Wednesdays and Fridays. She endorsed symptoms of a toothache for the past 3 days, as a result she missed her hemodialysis on Wednesday. On the afternoon of admission she started having shortness of breath, that rapidly worsened. She also endorsed new onset cough with hemoptysis. She was brought to the emergency room, she was found to be very hypoxic, requiring 100% nonrebreather mask to maintain O2 saturation, chest x-ray was consistent with pulmonary edema, she also had uncontrolled hypertension. She was started on a nitroglycerin infusion for hypertension, nephrology was emergently consulted for urgent hemodialysis which had been started prior to admitting MD evaluation. She appeared at that time comfortable, and now had been transitioned to a nasal cannula  Assessment/Plan:    Acute respiratory failure with hypoxia / Acute pulmonary edema -improved after HD    ESRD on hemodialysis / Noncompliance with dialysis -Renal following    Hypertension -cont Prinivil, Lopressor - monitor - suspect noncompliance at home w/ meds     HIV disease -per August 2014 admit note: pt recent re locate from [place] and had not established with ID clinic therefore was given contact info last admit - she has not made contact w/ the clinic to date  -home meds show ARV meds -check CD4 and viral load -repeat CXR in am to ensure no HIV related pulmonary infections    Anemia in CKD (chronic kidney disease) -Hgb stable    Elevated troponin -likely due to acute pulmonary  edema in ESRD  -doubt cardiac ischemia -ECHO pending to r/o WMA - if present, will require ischemia eval    DVT prophylaxis: SCDs Code Status: Full Family Communication: no family present at time of ecam  Disposition Plan/Expected LOS: Transfer to floor  Consultants: Nephrology  Procedures: TTE - pending  Antibiotics: None  HPI/Subjective: Patient awakened from sleep without any specific complaints. Endorses breathing much better than at time of admission.  Denies chest pain, n/v, or abdom pain.  No HA.  Objective: Blood pressure 186/88, pulse 81, temperature 98.7 F (37.1 C), temperature source Oral, resp. rate 24, height 5\' 2"  (1.575 m), weight 58.3 kg (128 lb 8.5 oz), SpO2 97.00%.  Intake/Output Summary (Last 24 hours) at 11/16/12 1258 Last data filed at 11/16/12 0900  Gross per 24 hour  Intake    540 ml  Output   3455 ml  Net  -2915 ml   Exam: General: No acute respiratory distress at rest  Lungs: Scattered bilateral crackles primarily in the bases Cardiovascular: Regular rate and rhythm without murmur gallop or rub normal S1 and S2 Abdomen: Nontender, nondistended, soft, bowel sounds positive, no rebound, no ascites, no appreciable mass Musculoskeletal: No significant cyanosis, clubbing, edema of bilateral lower extremities Neurological: Alert and oriented x 3, moves all extremities x 4 without focal neurological deficits, CN 2-12 intact   Scheduled Meds:  Scheduled Meds: . abacavir  300 mg Oral BID  . amLODipine  10 mg Oral Daily  . aspirin  325 mg Oral Daily  . cinacalcet  30 mg Oral QHS  . darbepoetin (ARANESP) injection - DIALYSIS  40  mcg Intravenous Q Fri-HD  . doxercalciferol  4 mcg Intravenous Q M,W,F-HD  . etravirine  200 mg Oral BID WC  . lisinopril  20 mg Oral Daily  . metoprolol succinate  25 mg Oral QHS  . multivitamin  1 tablet Oral QHS  . raltegravir  400 mg Oral BID  . sevelamer carbonate  2.4 g Oral TID WC  . [START ON 11/17/2012] tenofovir   300 mg Oral Q Sat   Data Reviewed: Basic Metabolic Panel:  Recent Labs Lab 11/15/12 1805 11/16/12 0641  NA 135 139  K 4.6 4.8  CL 89* 93*  CO2 21 31  GLUCOSE 135* 96  BUN 78* 26*  CREATININE 12.47* 5.87*  CALCIUM 8.9 9.3   Liver Function Tests:  Recent Labs Lab 11/16/12 0641  AST 25  ALT 23  ALKPHOS 105  BILITOT 0.5  PROT 7.5  ALBUMIN 3.8   CBC:  Recent Labs Lab 11/15/12 1805 11/16/12 0641  WBC 10.5 7.6  HGB 9.3* 8.5*  HCT 29.1* 27.0*  MCV 90.7 90.6  PLT 205 160   Cardiac Enzymes:  Recent Labs Lab 11/16/12 0120 11/16/12 0641  TROPONINI 0.67* 0.85*   BNP (last 3 results)  Recent Labs  11/15/12 1805  PROBNP >70000.0*   CBG:  Recent Labs Lab 11/15/12 1832 11/16/12 0815  GLUCAP 101* 85    Recent Results (from the past 240 hour(s))  MRSA PCR SCREENING     Status: None   Collection Time    11/16/12 12:55 AM      Result Value Range Status   MRSA by PCR NEGATIVE  NEGATIVE Final   Comment:            The GeneXpert MRSA Assay (FDA     approved for NASAL specimens     only), is one component of a     comprehensive MRSA colonization     surveillance program. It is not     intended to diagnose MRSA     infection nor to guide or     monitor treatment for     MRSA infections.     Studies:  Recent x-ray studies have been reviewed in detail by the Attending Physician    Junious Silk, ANP [facility] Office  [phone] Pager [phone]  **If unable to reach the above provider after paging please contact the Flow Manager @ [phone]  On-Call/Text Page:      [email or web address]      password [email or web address]  If 7PM-7AM, please contact night-coverage [email or web address] Password [email or web address] 11/16/2012, 12:58 PM   LOS: 1 day   I have personally examined this patient and reviewed the entire database. I have reviewed the above note, made any necessary editorial changes, and agree with its content.  Lonia Blood, MD [facility]

## 2012-11-16 NOTE — Progress Notes (Signed)
Spoke with Cardiology on call-Dr Balfour-who recommended to continue with current management,he had no further recommendations at this time.Will check and Echo, and continue to cycle enzymes. No current chest pain. Repeat EKG

## 2012-11-16 NOTE — Progress Notes (Signed)
MD notified of continued increase in BP. New orders received. Will continue to monitor and alert MD as needed.

## 2012-11-17 ENCOUNTER — Inpatient Hospital Stay (HOSPITAL_COMMUNITY): Payer: Medicare Other

## 2012-11-17 DIAGNOSIS — R7989 Other specified abnormal findings of blood chemistry: Secondary | ICD-10-CM

## 2012-11-17 DIAGNOSIS — I10 Essential (primary) hypertension: Secondary | ICD-10-CM

## 2012-11-17 DIAGNOSIS — D631 Anemia in chronic kidney disease: Secondary | ICD-10-CM

## 2012-11-17 DIAGNOSIS — N189 Chronic kidney disease, unspecified: Secondary | ICD-10-CM

## 2012-11-17 LAB — CBC
Hemoglobin: 9.6 g/dL — ABNORMAL LOW (ref 12.0–15.0)
MCV: 92.7 fL (ref 78.0–100.0)
Platelets: 183 10*3/uL (ref 150–400)
RBC: 3.31 MIL/uL — ABNORMAL LOW (ref 3.87–5.11)
WBC: 6.1 10*3/uL (ref 4.0–10.5)

## 2012-11-17 LAB — GLUCOSE, CAPILLARY: Glucose-Capillary: 114 mg/dL — ABNORMAL HIGH (ref 70–99)

## 2012-11-17 MED ORDER — METOPROLOL SUCCINATE ER 50 MG PO TB24: 50.0000 mg | ORAL_TABLET | Freq: Two times a day (BID) | ORAL | Status: DC

## 2012-11-17 MED ORDER — ONDANSETRON HCL 4 MG/2ML IJ SOLN
4.0000 mg | Freq: Four times a day (QID) | INTRAMUSCULAR | Status: DC | PRN
  Administered 2012-11-17: 4 mg via INTRAVENOUS

## 2012-11-17 MED ORDER — METOPROLOL SUCCINATE ER 50 MG PO TB24
50.0000 mg | ORAL_TABLET | Freq: Two times a day (BID) | ORAL | Status: DC
  Administered 2012-11-17: 13:00:00 50 mg via ORAL
  Filled 2012-11-17 (×2): qty 1

## 2012-11-17 MED ORDER — BENZONATATE 100 MG PO CAPS: 100.0000 mg | ORAL_CAPSULE | Freq: Three times a day (TID) | ORAL | Status: AC | PRN

## 2012-11-17 NOTE — Progress Notes (Signed)
Subjective: Breathing is fine. No further hemoptysis or SOB Had HD again yesterday to get back on schedule.  Objective Vital signs in last 24 hours: Filed Vitals:   11/16/12 1821 11/16/12 2119 11/17/12 0503 11/17/12 0653  BP: 122/71 139/98 190/95 176/90  Pulse: 90 99 92   Temp: 98.4 F (36.9 C) 99.3 F (37.4 C) 98.5 F (36.9 C)   TempSrc: Oral Oral Oral   Resp: 22 22 20    Height:      Weight:      SpO2: 96% 97% 97%    Weight change: 3.6 kg (7 lb 15 oz)  Intake/Output Summary (Last 24 hours) at 11/17/12 1028 Last data filed at 11/17/12 0653  Gross per 24 hour  Intake      0 ml  Output   1995 ml  Net  -1995 ml   Physical Exam:  Blood pressure 152/77, pulse 87, temperature 99.2 F (37.3 C), temperature source Oral, resp. rate 25, height 5\' 2"  (1.575 m), weight 58.3 kg (128 lb 8.5 oz), SpO2 94.00%.  Weight trending: (EDW 56) 11/16/12 1750 57.5 kg  Post HD  11/16/12 1423 59.6 kg pre HD  Middle aged BF NAD Lungs with few crackles right bas only otherwise clear  S1S2 +S4 PMI lat and inf dsplaced  1/6 murmr ULSB Abdomen soft and non tender No LE edema Right AVF with good bruit and thrill  Basic Metabolic Panel:  Recent Labs Lab 11/15/12 1805 11/16/12 0641 11/16/12 1436  NA 135 139 136  K 4.6 4.8 4.7  CL 89* 93* 91*  CO2 21 31 30   GLUCOSE 135* 96 125*  BUN 78* 26* 34*  CREATININE 12.47* 5.87* 6.51*  CALCIUM 8.9 9.3 8.7  PHOS  --   --  5.6*    Recent Labs Lab 11/16/12 0641 11/16/12 1436  AST 25  --   ALT 23  --   ALKPHOS 105  --   BILITOT 0.5  --   PROT 7.5  --   ALBUMIN 3.8 3.4*    Recent Labs Lab 11/15/12 1805 11/16/12 0641 11/17/12 0413  WBC 10.5 7.6 6.1  HGB 9.3* 8.5* 9.6*  HCT 29.1* 27.0* 30.7*  MCV 90.7 90.6 92.7  PLT 205 160 183    Recent Labs Lab 11/16/12 0120 11/16/12 0641 11/16/12 1236 11/17/12 0413  TROPONINI 0.67* 0.85* 0.71* 0.35*   CBG:  Recent Labs Lab 11/15/12 1832 11/16/12 0815 11/17/12 0753  GLUCAP 101* 85  114*   Studies/Results: Dg Chest Port 1 View  11/17/2012   **ADDENDUM** CREATED: 11/17/2012 07:00:04  There is a dictation error in the initially dictated clinical indication portion of the report.  This study was performed to follow up pulmonary edema.  **END ADDENDUM** SIGNED BY: Rise Mu, M.D.  11/17/2012   *RADIOLOGY REPORT*  Clinical Data: Right main pulmonary edema  PORTABLE CHEST - 1 VIEW  Comparison: Prior radiograph from 11/16/2012  Findings: Cardiomegaly is stable as compared to the prior exam.  Lungs are normally inflated.  There has been essentially complete interval resolution of previously identified pulmonary edema.  No new airspace opacity identified.  No definite pleural effusion.  No pneumothorax.  Osseous structures are unchanged.  IMPRESSION: Stable cardiomegaly with no significant pulmonary edema now identified.  Original Report Authenticated By: Rise Mu, M.D.   Dg Chest Port 1 View  11/16/2012   *RADIOLOGY REPORT*  Clinical Data: Shortness of breath  PORTABLE CHEST - 1 VIEW  Comparison: Chest radiograph 11/15/2012  Findings: Stable enlarged  cardiac and mediastinal contours. Interval improvement in bilateral mid and lower lung heterogeneous opacities.  No definite pleural effusion or pneumothorax.  Regional skeleton is unremarkable.  IMPRESSION: Slight interval improvement in bilateral mid and lower lung opacities likely representing improving pulmonary edema.   Original Report Authenticated By: Annia Belt, M.D   Dg Chest Port 1 View  11/15/2012   *RADIOLOGY REPORT*  Clinical Data: Shortness of breath, hemoptysis, dialysis patient, initial encounter.  PORTABLE CHEST - 1 VIEW  Comparison: 09/29/2012  Findings:  Grossly unchanged enlarged cardiac silhouette and mediastinal contours given patient rotation.  Pulmonary vasculature is indistinct with cephalization of flow.  Interval development of peri and infrahilar predominant heterogeneous air space opacities.  Trace bilateral effusions not excluded.  No definite pneumothorax, though note, the right lung apex is occluded secondary to overlying chin.  Grossly unchanged bones.  IMPRESSION: Findings most compatible with alveolar pulmonary edema though note, underlying infection and/or hemorrhage is not excluded on the basis of this examination.   Original Report Authenticated By: Tacey Ruiz, MD  ------------------------------------------------------------ Transthoracic Echocardiography  Patient: Inas, Avena MR #: [id number] Study Date: 11/16/2012 Gender: F Age: 52 Height: 157.5cm Weight: 58.1kg BSA: 1.39m^2 Pt. Status: Room: 2H20C  PERFORMING [place], [facility] ATTENDING [place], Tinnie Gens T ADMITTING Ghimire, Shanker ORDERING Ghimire, Shanker SONOGRAPHER Georgian Co, RDCS, CCT cc:  ------------------------------------------------------------ LV EF: 45% - 50%  ------------------------------------------------------------ Indications: Respiratory Failure acute 518.81.  ------------------------------------------------------------ History: PMH: Dyspnea. Risk factors: Acute Pulmonary Edema. End stage renal disease. HIV. Hypertension.  ------------------------------------------------------------ Study Conclusions  - Left ventricle: The cavity size was normal. Wall thickness was increased in a pattern of moderate LVH. Systolic function was mildly reduced. The estimated ejection fraction was in the range of 45% to 50%. Diffuse hypokinesis. Doppler parameters are consistent with abnormal left ventricular relaxation (grade 1 diastolic dysfunction). Doppler parameters are consistent with high ventricular filling pressure. - Aortic valve: There was mild stenosis. Valve area: 1.84cm^2(VTI). Valve area: 1.64cm^2 (Vmax). - Mitral valve: Calcified annulus. Mildly thickened leaflets . - Left atrium: The atrium was mildly dilated. - Right ventricle: The cavity size was mildly  dilated. Impressions:  - Mildly elevated LVOT velocity suggests mild AS; however, visually, aortic valve appears to open well. Transthoracic echocardiography. M-mode, complete 2D, spectral Doppler, and color Doppler. Height: Height: 157.5cm. Height: 62in. Weight: Weight: 58.1kg. Weight: 127.7lb. Body mass index: BMI: 23.4kg/m^2. Body surface area: BSA: 1.30m^2. Blood pressure: 186/88. Patient status: Inpatient. Location: ICU/CCU  ------------------------------------------------------------  ------------------------------------------------------------ Left ventricle: The cavity size was normal. Wall thickness was increased in a pattern of moderate LVH. Systolic function was mildly reduced. The estimated ejection fraction was in the range of 45% to 50%. Diffuse hypokinesis. Doppler parameters are consistent with abnormal left ventricular relaxation (grade 1 diastolic dysfunction). Doppler parameters are consistent with high ventricular filling pressure.  ------------------------------------------------------------ Aortic valve: Trileaflet; mildly thickened leaflets. Mobility was not restricted. Doppler: There was mild stenosis. No regurgitation. Valve area: 1.84cm^2(VTI). Indexed valve area: 1.16cm^2/m^2 (VTI). Peak velocity ratio of LVOT to aortic valve: 0.48. Valve area: 1.64cm^2 (Vmax). Indexed valve area: 1.04cm^2/m^2 (Vmax). Mean gradient: 11mm Hg (S). Peak gradient: 23mm Hg (S).  ------------------------------------------------------------ Aorta: Aortic root: The aortic root was normal in size.  ------------------------------------------------------------ Mitral valve: Calcified annulus. Mildly thickened leaflets . Mobility was not restricted. Doppler: Transvalvular velocity was within the normal range. There was no evidence for stenosis. Trivial regurgitation. Peak gradient: 3mm Hg (D).  ------------------------------------------------------------ Left atrium: The  atrium was mildly dilated.  ------------------------------------------------------------ Right ventricle: The cavity size  was mildly dilated. Systolic function was normal.  ------------------------------------------------------------ Pulmonic valve: Doppler: Transvalvular velocity was within the normal range. There was no evidence for stenosis. Mild regurgitation.  ------------------------------------------------------------ Tricuspid valve: Structurally normal valve. Doppler: Transvalvular velocity was within the normal range. Mild regurgitation.  ------------------------------------------------------------ Pulmonary artery: Systolic pressure was within the normal range.  ------------------------------------------------------------ Right atrium: The atrium was normal in size.  ------------------------------------------------------------ Pericardium: There was no pericardial effusion.  ------------------------------------------------------------ Systemic veins: Inferior vena cava: The vessel was normal in size.  ------------------------------------------------------------  ------------------------------------------------------------ Prepared and Electronically Authenticated by  Olga Millers [id number]  Medications:   . abacavir  300 mg Oral BID  . amLODipine  10 mg Oral Daily  . aspirin  325 mg Oral Daily  . cinacalcet  30 mg Oral QHS  . darbepoetin (ARANESP) injection - DIALYSIS  40 mcg Intravenous Q Fri-HD  . doxercalciferol  4 mcg Intravenous Q M,W,F-HD  . etravirine  200 mg Oral BID WC  . lisinopril  20 mg Oral Daily  . metoprolol succinate  50 mg Oral QHS  . multivitamin  1 tablet Oral QHS  . raltegravir  400 mg Oral BID  . sevelamer carbonate  2.4 g Oral TID WC  . tenofovir  300 mg Oral Q Sat   I  have reviewed scheduled and prn medications.  HD orders: MWF [place] 4 hours F180 450/800 2K 2.5 Ca EDW 56 last Hb 9/12 8.1 with EPO 3000  Hectorol  4 mcg  Heparin 3000  ASSESSMENT/PLAN  1. ESRD  MWF [place] s/p dialysis for acute pulmonary edema/hemoptysis (as result of of missed dialysis, uncontrolled hypertension). HD again yesterday and is now back on MWF schedule Did not get to EDW but clinically she looks euvolemic at weight of 57.5 if post weight is correct  2. Anemia resumed ESA was on 3000 EPO TIW as outpt.  Last Hb outpt 0on 9/12 was 8.1. Increase to 4200 EPO TIW as outpt (gave Aranesp 60 as equivalent with Fridays HD) 3. CKD-MBD continue binder. Continue outpt hectorol. 4. Elevated troponins.  Suspect demand ischemia.  Marked cardiomegaly on CXR. Echo with diffuse hypokinesis but no focal wall motion abnormalities, EF 45-50%, LVH, grade I diastolic dysfunction. No focal WMA's  5. HIV - continue meds. Primary indicated they ordered a CD4 - do not see order - will obtain 6. HTN - continue meds. BP's above goal on current regimen. Will  Increase metoprolol to 50 mg BID and give additional dose this AM 7. Disposition per primary  Camille Bal, MD [facility] [phone] pager 11/17/2012, 10:28 AM

## 2012-11-17 NOTE — Discharge Summary (Signed)
Physician Discharge Summary  Bethany [name] ZOX:[id number] DOB: 03-Sep-1960 DOA: 11/15/2012  PCP: Provider Not In System  Admit date: 11/15/2012 Discharge date: 11/17/2012  Time spent: > 35 minutes  Recommendations for Outpatient Follow-up:  Please be sure to follow up with wbc Ensure patient continue to go to her regularly scheduled HD sessions Patient will need to follow up with ID specialist CD4 and viral load in process may be follow up by ID  Discharge Diagnoses:  Active Problems:   ESRD on hemodialysis   HIV disease   Hypertension   Acute respiratory failure with hypoxia   Acute pulmonary edema   Noncompliance of patient with renal dialysis   Anemia in CKD (chronic kidney disease)   Elevated troponin   Discharge Condition: stable  Diet recommendation: low sodium heart healthy  Filed Weights   11/16/12 0100 11/16/12 1423 11/16/12 1750  Weight: 58.3 kg (128 lb 8.5 oz) 59.6 kg (131 lb 6.3 oz) 57.5 kg (126 lb 12.2 oz)    History of present illness:  Brief narrative:  52 y.o. female with a Past Medical History of end-stage renal disease on hemodialysis, HIV, hypertension who presented with SOB. Patient has a long-standing history of HIV nephropathy, and moved to [facility] just before August 2014. Her usual hemodialysis days are Mondays Wednesdays and Fridays. She endorsed symptoms of a toothache for the past 3 days, as a result she missed her hemodialysis on Wednesday. On the afternoon of admission she started having shortness of breath, that rapidly worsened. She also endorsed new onset cough with hemoptysis. She was brought to the emergency room, she was found to be very hypoxic, requiring 100% nonrebreather mask to maintain O2 saturation, chest x-ray was consistent with pulmonary edema, she also had uncontrolled hypertension. She was started on a nitroglycerin infusion for hypertension, nephrology was emergently consulted for urgent hemodialysis which had been started prior to  admitting MD evaluation. She appeared at that time comfortable, and now had been transitioned to a nasal cannula   Hospital Course:   Acute respiratory failure with hypoxia / Acute pulmonary edema  -improved after HD   ESRD on hemodialysis / Noncompliance with dialysis  -Renal following   Hypertension  -cont Prinivil, Lopressor - monitor - suspect noncompliance at home w/ meds   HIV disease  -per August 2014 admit note: pt recent re locate from [place] and had not established with ID clinic therefore was given contact info last admit - she has not made contact w/ the clinic to date  -home meds show ARV meds  -check CD4 and viral load in process may be follow up by ID -repeat CXR in am on day of admission reported no significant pulmonary edema. No reports of infiltrate or consolidation.  Anemia in CKD (chronic kidney disease)  -Hgb stable, no active bleeding.  Elevated troponin  - likely due to acute pulmonary edema in ESRD  - doubt cardiac ischemia, no chest pain reported to me on day of discharge - Echo reporting grade 1 diastolic dysfunction.  Procedures:  Echocardiogram  Consultations:  Nephrology  Discharge Exam: Filed Vitals:   11/17/12 1347  BP: 151/86  Pulse: 89  Temp: 99.4 F (37.4 C)  Resp: 20    General: Pt in NAD, Alert and awake, smiling at times Cardiovascular: RRR, no MRG Respiratory: CTA BL, no wheezes  Discharge Instructions  Discharge Orders   Future Orders Complete By Expires   Call MD for:  difficulty breathing, headache or visual disturbances  As directed  Call MD for:  severe uncontrolled pain  As directed    Call MD for:  temperature >100.4  As directed    Diet - low sodium heart healthy  As directed    Discharge instructions  As directed    Comments:     Please be sure to follow up with your ID specialist and continue your routine HD for your ESRD.   Increase activity slowly  As directed        Medication List    STOP taking  these medications       PRESCRIPTION MEDICATION      TAKE these medications       abacavir 300 MG tablet  Commonly known as:  ZIAGEN  Take 300 mg by mouth 2 (two) times daily.     amLODipine 10 MG tablet  Commonly known as:  NORVASC  Take 10 mg by mouth daily.     benzonatate 100 MG capsule  Commonly known as:  TESSALON PERLES  Take 1 capsule (100 mg total) by mouth 3 (three) times daily as needed for cough.     cinacalcet 30 MG tablet  Commonly known as:  SENSIPAR  Take 30 mg by mouth at bedtime.     CORICIDIN COUGH/COLD 4-30 MG Tabs  Generic drug:  Chlorpheniramine-DM  Take 1 tablet by mouth daily as needed.     ENDOCET 5-325 MG per tablet  Generic drug:  oxyCODONE-acetaminophen  Take 1 tablet by mouth every 4 (four) hours as needed for pain.     etravirine 100 MG tablet  Commonly known as:  INTELENCE  Take 200 mg by mouth 2 (two) times daily with a meal.     HYDROcodone-acetaminophen 5-325 MG per tablet  Commonly known as:  NORCO/VICODIN  Take 1 tablet by mouth every 6 (six) hours as needed for pain.     ibuprofen 200 MG tablet  Commonly known as:  ADVIL,MOTRIN  Take 400 mg by mouth every 6 (six) hours as needed for pain.     lisinopril 20 MG tablet  Commonly known as:  PRINIVIL,ZESTRIL  Take 1 tablet (20 mg total) by mouth daily.     metoprolol succinate 50 MG 24 hr tablet  Commonly known as:  TOPROL-XL  Take 1 tablet (50 mg total) by mouth 2 (two) times daily. Take with or immediately following a meal.     multivitamin Tabs tablet  Take 1 tablet by mouth daily.     raltegravir 400 MG tablet  Commonly known as:  ISENTRESS  Take 400 mg by mouth 2 (two) times daily.     sevelamer carbonate 2.4 G Pack  Commonly known as:  RENVELA  Take 2.4 g by mouth 3 (three) times daily with meals.     temazepam 15 MG capsule  Commonly known as:  RESTORIL  Take 15 mg by mouth at bedtime as needed for sleep.     tenofovir 300 MG tablet  Commonly known as:  VIREAD   Take 300 mg by mouth every 7 (seven) days.     tetrahydrozoline 0.05 % ophthalmic solution  Place 2 drops into both eyes daily as needed (for dry eyes).       Allergies  Allergen Reactions  . Benadryl [Diphenhydramine Hcl] Other (See Comments)    Makes patient jittery  . Latex Hives and Itching      The results of significant diagnostics from this hospitalization (including imaging, microbiology, ancillary and laboratory) are listed below for reference.  Significant Diagnostic Studies: Dg Chest Port 1 View  11/17/2012   **ADDENDUM** CREATED: 11/17/2012 07:00:04  There is a dictation error in the initially dictated clinical indication portion of the report.  This study was performed to follow up pulmonary edema.  **END ADDENDUM** SIGNED BY: Rise Mu, M.D.  11/17/2012   *RADIOLOGY REPORT*  Clinical Data: Right main pulmonary edema  PORTABLE CHEST - 1 VIEW  Comparison: Prior radiograph from 11/16/2012  Findings: Cardiomegaly is stable as compared to the prior exam.  Lungs are normally inflated.  There has been essentially complete interval resolution of previously identified pulmonary edema.  No new airspace opacity identified.  No definite pleural effusion.  No pneumothorax.  Osseous structures are unchanged.  IMPRESSION: Stable cardiomegaly with no significant pulmonary edema now identified.  Original Report Authenticated By: Rise Mu, M.D.   Dg Chest Port 1 View  11/16/2012   *RADIOLOGY REPORT*  Clinical Data: Shortness of breath  PORTABLE CHEST - 1 VIEW  Comparison: Chest radiograph 11/15/2012  Findings: Stable enlarged cardiac and mediastinal contours. Interval improvement in bilateral mid and lower lung heterogeneous opacities.  No definite pleural effusion or pneumothorax.  Regional skeleton is unremarkable.  IMPRESSION: Slight interval improvement in bilateral mid and lower lung opacities likely representing improving pulmonary edema.   Original Report  Authenticated By: Annia Belt, M.D   Dg Chest Port 1 View  11/15/2012   *RADIOLOGY REPORT*  Clinical Data: Shortness of breath, hemoptysis, dialysis patient, initial encounter.  PORTABLE CHEST - 1 VIEW  Comparison: 09/29/2012  Findings:  Grossly unchanged enlarged cardiac silhouette and mediastinal contours given patient rotation.  Pulmonary vasculature is indistinct with cephalization of flow.  Interval development of peri and infrahilar predominant heterogeneous air space opacities. Trace bilateral effusions not excluded.  No definite pneumothorax, though note, the right lung apex is occluded secondary to overlying chin.  Grossly unchanged bones.  IMPRESSION: Findings most compatible with alveolar pulmonary edema though note, underlying infection and/or hemorrhage is not excluded on the basis of this examination.   Original Report Authenticated By: Tacey Ruiz, MD    Microbiology: Recent Results (from the past 240 hour(s))  MRSA PCR SCREENING     Status: None   Collection Time    11/16/12 12:55 AM      Result Value Range Status   MRSA by PCR NEGATIVE  NEGATIVE Final   Comment:            The GeneXpert MRSA Assay (FDA     approved for NASAL specimens     only), is one component of a     comprehensive MRSA colonization     surveillance program. It is not     intended to diagnose MRSA     infection nor to guide or     monitor treatment for     MRSA infections.     Labs: Basic Metabolic Panel:  Recent Labs Lab 11/15/12 1805 11/16/12 0641 11/16/12 1436  NA 135 139 136  K 4.6 4.8 4.7  CL 89* 93* 91*  CO2 21 31 30   GLUCOSE 135* 96 125*  BUN 78* 26* 34*  CREATININE 12.47* 5.87* 6.51*  CALCIUM 8.9 9.3 8.7  PHOS  --   --  5.6*   Liver Function Tests:  Recent Labs Lab 11/16/12 0641 11/16/12 1436  AST 25  --   ALT 23  --   ALKPHOS 105  --   BILITOT 0.5  --   PROT 7.5  --  ALBUMIN 3.8 3.4*   No results found for this basename: LIPASE, AMYLASE,  in the last 168 hours No  results found for this basename: AMMONIA,  in the last 168 hours CBC:  Recent Labs Lab 11/15/12 1805 11/16/12 0641 11/17/12 0413  WBC 10.5 7.6 6.1  HGB 9.3* 8.5* 9.6*  HCT 29.1* 27.0* 30.7*  MCV 90.7 90.6 92.7  PLT 205 160 183   Cardiac Enzymes:  Recent Labs Lab 11/16/12 0120 11/16/12 0641 11/16/12 1236 11/17/12 0413  TROPONINI 0.67* 0.85* 0.71* 0.35*   BNP: BNP (last 3 results)  Recent Labs  11/15/12 1805  PROBNP >70000.0*   CBG:  Recent Labs Lab 11/15/12 1832 11/16/12 0815 11/17/12 0753  GLUCAP 101* 85 114*       Signed:  Penny Pia  [facility] 11/17/2012, 3:46 PM

## 2012-11-19 LAB — CD4/CD8 (T-HELPER/T-SUPPRESSOR CELL)
CD8 T Cell Abs: 380 /uL (ref 230–1000)
Total lymphocyte count: 820 /uL — ABNORMAL LOW (ref 1000–4000)

## 2012-11-19 LAB — HIV-1 RNA ULTRAQUANT REFLEX TO GENTYP+: HIV-1 RNA Quant, Log: 1.4 {Log} — ABNORMAL HIGH (ref <1.30)

## 2013-03-12 ENCOUNTER — Ambulatory Visit: Payer: Medicare Other | Attending: Internal Medicine | Admitting: Internal Medicine

## 2013-03-12 ENCOUNTER — Encounter: Payer: Self-pay | Admitting: Internal Medicine

## 2013-03-12 VITALS — BP 201/91 | HR 72 | Temp 98.4°F | Resp 16 | Ht 62.0 in | Wt 127.0 lb

## 2013-03-12 DIAGNOSIS — I120 Hypertensive chronic kidney disease with stage 5 chronic kidney disease or end stage renal disease: Secondary | ICD-10-CM | POA: Insufficient documentation

## 2013-03-12 DIAGNOSIS — B20 Human immunodeficiency virus [HIV] disease: Secondary | ICD-10-CM | POA: Insufficient documentation

## 2013-03-12 DIAGNOSIS — N186 End stage renal disease: Secondary | ICD-10-CM

## 2013-03-12 DIAGNOSIS — I10 Essential (primary) hypertension: Secondary | ICD-10-CM | POA: Insufficient documentation

## 2013-03-12 DIAGNOSIS — Z992 Dependence on renal dialysis: Secondary | ICD-10-CM

## 2013-03-12 MED ORDER — ABACAVIR SULFATE 300 MG PO TABS: 300.0000 mg | ORAL_TABLET | Freq: Two times a day (BID) | ORAL | Status: DC

## 2013-03-12 MED ORDER — RALTEGRAVIR POTASSIUM 400 MG PO TABS: 400.0000 mg | ORAL_TABLET | Freq: Two times a day (BID) | ORAL | Status: DC

## 2013-03-12 MED ORDER — ETRAVIRINE 100 MG PO TABS: 200.0000 mg | ORAL_TABLET | Freq: Two times a day (BID) | ORAL | Status: DC

## 2013-03-12 MED ORDER — TENOFOVIR DISOPROXIL FUMARATE 300 MG PO TABS: 300.0000 mg | ORAL_TABLET | ORAL | Status: DC

## 2013-03-12 NOTE — Progress Notes (Signed)
Patient ID: Bethany [name], female   DOB: 1960/09/05, 53 y.o.   MRN: [id number] Patient Demographics  Bethany [name], is a 53 y.o. female  WUJ:[id number]  [id number]  DOB - 1960/09/05  CC:  Chief Complaint  Patient presents with  . Follow-up       HPI: Bethany [name] is a 53 y.o. female here today to establish medical care. She has Past Medical History of end-stage renal disease on hemodialysis, HIV, hypertension, a long-standing history of HIV nephropathy [place] [facility] [place] before August 2014. Her usual hemodialysis days are Mondays Wednesdays and Fridays. She does not have a primary care physician and has not seen an infectious disease specialist for her HIV since she moved to [place]. She is here to establish care and to get a referral to ID specialists. Her major complaint include a small spot between her gluteal cleft that may be a developing abscess. She has no fever. She claims compliant with all her anti-retroviral medications and her other medications for hypertension and end-stage renal disease. She has an established medical care with nephrologist and she is following up with them regularly. She is up to date on her immunizations per patient. She does not smoke cigarette as she does not drink alcohol. Her last HIV RNA count was 25 copies in September 2014 and her CD4 count was low at 220. Patient has No headache, No chest pain, No abdominal pain - No Nausea, No new weakness tingling or numbness, No Cough - SOB.  Allergies  Allergen Reactions  . Benadryl [Diphenhydramine Hcl] Other (See Comments)    Makes patient jittery  . Latex Hives and Itching   Past Medical History  Diagnosis Date  . 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  [place] HD in [place] in 2006, [place] and rec[place] dialysis in [place] starting in 2009.  Was receiving HD TTS schedule with a [facility].  Her nephrologist was Dr Vinnie Langtonada.  [place] [facility] [place] (Sep 29, 2012) and was not  able to set up new HD unit prior to coming.  Last HD was Tuesday July 29th.  HD access is R [facility].  Gets heparin at the center, dry weight was 56kg, ran 3 hours.     [name]. HIV disease 09/29/2012    Diagnosed on 2000, on HIV meds.  On ART, viral levels undetectable per pt.     [name]. 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    . Hypertension 09/29/2012   Current Outpatient Prescriptions on File Prior to Visit  Medication Sig Dispense Refill  . cinacalcet (SENSIPAR) 30 MG tablet Take 30 mg by mouth at bedtime.      [name]. lisinopril (PRINIVIL,ZESTRIL) 20 MG tablet Take 1 tablet (20 mg total) by mouth daily.  30 tablet  6  . metoprolol succinate (TOPROL-XL) 50 MG 24 hr tablet Take 1 tablet (50 mg total) by mouth 2 (two) times daily. Take with or immediately following a meal.  60 tablet  0  . temazepam (RESTORIL) 15 MG capsule Take 15 mg by mouth at bedtime as needed for sleep.      [name]. tetrahydrozoline 0.05 % ophthalmic solution Place 2 drops into both eyes daily as needed (for dry eyes).      [name]. amLODipine (NORVASC) 10 MG tablet Take 10 mg by  mouth daily.      . benzonatate (TESSALON PERLES) 100 MG capsule Take 1 capsule (100 mg total) by mouth 3 (three) times daily as needed for cough.  20 capsule  0  . Chlorpheniramine-DM (CORICIDIN COUGH/COLD) 4-30 MG TABS Take 1 tablet by mouth daily as needed.      [name] HYDROcodone-acetaminophen (NORCO/VICODIN) 5-325 MG per tablet Take 1 tablet by mouth every 6 (six) hours as needed for pain.      [name] ibuprofen (ADVIL,MOTRIN) 200 MG tablet Take 400 mg by mouth every 6 (six) hours as needed for pain.      . multivitamin (RENA-VIT) TABS tablet Take 1 tablet by mouth daily.  90 tablet  0  . oxyCODONE-acetaminophen (ENDOCET) 5-325 MG per tablet Take 1 tablet by mouth every 4 (four) hours as needed for pain.      . sevelamer carbonate (RENVELA) 2.4  G PACK Take 2.4 g by mouth 3 (three) times daily with meals.  90 each  6   No current facility-administered medications on file prior to visit.   Family History  Problem Relation Age of Onset  . Diabetes Mother   . Hypertension Mother   . Hypertension Sister   . Diabetes Sister    History   Social History  . Marital Status: Married    Spouse Name: N/A    Number of Children: N/A  . Years of Education: N/A   Occupational History  . Not on file.   Social History Main Topics  . Smoking status: Never Smoker   . Smokeless tobacco: Not on file  . Alcohol Use: No  . Drug Use: No  . Sexual Activity: Not on file   Other Topics Concern  . Not on file   Social History Narrative  . No narrative on file    Review of Systems: Constitutional: Negative for fever, chills, diaphoresis, activity change, appetite change and fatigue. HENT: Negative for ear pain, nosebleeds, congestion, facial swelling, rhinorrhea, neck pain, neck stiffness and ear discharge.  Eyes: Negative for pain, discharge, redness, itching and visual disturbance. Respiratory: Negative for cough, choking, chest tightness, shortness of breath, wheezing and stridor.  Cardiovascular: Negative for chest pain, palpitations and leg swelling. Gastrointestinal: Negative for abdominal distention. Genitourinary: Negative for dysuria, urgency, frequency, hematuria, flank pain, decreased urine volume, difficulty urinating and dyspareunia.  Musculoskeletal: Negative for back pain, joint swelling, arthralgia and gait problem. Neurological: Negative for dizziness, tremors, seizures, syncope, facial asymmetry, speech difficulty, weakness, light-headedness, numbness and headaches.  Hematological: Negative for adenopathy. Does not bruise/bleed easily. Psychiatric/Behavioral: Negative for hallucinations, behavioral problems, confusion, dysphoric mood, decreased concentration and agitation.    Objective:   Filed Vitals:   03/12/13  1102  BP: 201/91  Pulse: 72  Temp: 98.4 F (36.9 C)  Resp: 16    Physical Exam: Constitutional: Patient appears well-developed and well-nourished. No distress. HENT: Normocephalic, atraumatic, External right and left ear normal. Oropharynx is clear and moist.  Eyes: Conjunctivae and EOM are normal. PERRLA, no scleral icterus. Neck: Normal ROM. Neck supple. No JVD. No tracheal deviation. No thyromegaly. CVS: RRR, S1/S2 +, no murmurs, no gallops, no carotid bruit.  Pulmonary: Effort and breath sounds normal, no stridor, rhonchi, wheezes, rales.  Abdominal: Soft. BS +, no distension, tenderness, rebound or guarding.  Musculoskeletal: Normal range of motion. No edema and no tenderness.  Lymphadenopathy: No lymphadenopathy noted, cervical, inguinal or axillary Neuro: Alert. Normal reflexes, muscle tone coordination. No cranial nerve deficit. Skin: Skin is warm and dry. No  rash noted. Not diaphoretic. No erythema. No pallor. Psychiatric: Normal mood and affect. Behavior, judgment, thought content normal.  Lab Results  Component Value Date   WBC 6.1 11/17/2012   HGB 9.6* 11/17/2012   HCT 30.7* 11/17/2012   MCV 92.7 11/17/2012   PLT 183 11/17/2012   Lab Results  Component Value Date   CREATININE 6.51* 11/16/2012   BUN 34* 11/16/2012   NA 136 11/16/2012   K 4.7 11/16/2012   CL 91* 11/16/2012   CO2 30 11/16/2012    No results found for this basename: HGBA1C   Lipid Panel  No results found for this basename: chol, trig, hdl, cholhdl, vldl, ldlcalc       Assessment and plan:   1. HIV disease  - Ambulatory referral to Infectious Disease Continue - tenofovir (VIREAD) 300 MG tablet; Take 1 tablet (300 mg total) by mouth every 7 (seven) days.  Dispense: 4 tablet; Refill: 2 - raltegravir (ISENTRESS) 400 MG tablet; Take 1 tablet (400 mg total) by mouth 2 (two) times daily.  Dispense: 60 tablet; Refill: 2 - etravirine (INTELENCE) 100 MG tablet; Take 2 tablets (200 mg total) by mouth 2 (two)  times daily with a meal.  Dispense: 60 tablet; Refill: 2 - abacavir (ZIAGEN) 300 MG tablet; Take 1 tablet (300 mg total) by mouth 2 (two) times daily.  Dispense: 60 tablet; Refill: 2  2. ESRD on dialysis Continue hemodialysis Monday, Wednesday and Fridays Continue to follow up with nephrologist  3. HTN (hypertension) Continue amlodipine 10 mg tablet by mouth daily Toprol-XL 50 mg tablet by mouth daily Lisinopril 20 mg tablet by mouth daily  Follow up in 3 months or when necessary  The patient was given clear instructions to go to ER or return to medical center if symptoms don't improve, worsen or new problems develop. The patient verbalized understanding. The patient was told to call to get lab results if they haven't heard anything in the next week.     [name], MD, MHA, [facility] [place], [place] [phone]   03/12/2013, 11:43 AM

## 2013-03-12 NOTE — Progress Notes (Signed)
Pt is here to establish care. Pt has a history of HTN, pulmonary, edema and HIV. Pt has wound above her butt that comes and goes for about 6 months.

## 2013-03-12 NOTE — Patient Instructions (Signed)
DASH Diet  The DASH diet stands for "Dietary Approaches to Stop Hypertension." It is a healthy eating plan that has been shown to reduce high blood pressure (hypertension) in as little as 14 days, while also possibly providing other significant health benefits. These other health benefits include reducing the risk of breast cancer after menopause and reducing the risk of type 2 diabetes, heart disease, colon cancer, and stroke. Health benefits also include weight loss and slowing kidney failure in patients with chronic kidney disease.   DIET GUIDELINES  · Limit salt (sodium). Your diet should contain less than 1500 mg of sodium daily.  · Limit refined or processed carbohydrates. Your diet should include mostly whole grains. Desserts and added sugars should be used sparingly.  · Include small amounts of heart-healthy fats. These types of fats include nuts, oils, and tub margarine. Limit saturated and trans fats. These fats have been shown to be harmful in the body.  CHOOSING FOODS   The following food groups are based on a 2000 calorie diet. See your Registered Dietitian for individual calorie needs.  Grains and Grain Products (6 to 8 servings daily)  · Eat More Often: Whole-wheat bread, brown rice, whole-grain or wheat pasta, quinoa, popcorn without added fat or salt (air popped).  · Eat Less Often: White bread, white pasta, white rice, cornbread.  Vegetables (4 to 5 servings daily)  · Eat More Often: Fresh, frozen, and canned vegetables. Vegetables may be raw, steamed, roasted, or grilled with a minimal amount of fat.  · Eat Less Often/Avoid: Creamed or fried vegetables. Vegetables in a cheese sauce.  Fruit (4 to 5 servings daily)  · Eat More Often: All fresh, canned (in natural juice), or frozen fruits. Dried fruits without added sugar. One hundred percent fruit juice (½ cup [237 mL] daily).  · Eat Less Often: Dried fruits with added sugar. Canned fruit in light or heavy syrup.  Lean Meats, Fish, and Poultry (2  servings or less daily. One serving is 3 to 4 oz [85-114 g]).  · Eat More Often: Ninety percent or leaner ground beef, tenderloin, sirloin. Round cuts of beef, chicken breast, turkey breast. All fish. Grill, bake, or broil your meat. Nothing should be fried.  · Eat Less Often/Avoid: Fatty cuts of meat, turkey, or chicken leg, thigh, or wing. Fried cuts of meat or fish.  Dairy (2 to 3 servings)  · Eat More Often: Low-fat or fat-free milk, low-fat plain or light yogurt, reduced-fat or part-skim cheese.  · Eat Less Often/Avoid: Milk (whole, 2%). Whole milk yogurt. Full-fat cheeses.  Nuts, Seeds, and Legumes (4 to 5 servings per week)  · Eat More Often: All without added salt.  · Eat Less Often/Avoid: Salted nuts and seeds, canned beans with added salt.  Fats and Sweets (limited)  · Eat More Often: Vegetable oils, tub margarines without trans fats, sugar-free gelatin. Mayonnaise and salad dressings.  · Eat Less Often/Avoid: Coconut oils, palm oils, butter, stick margarine, cream, half and half, cookies, candy, pie.  FOR MORE INFORMATION  The Dash Diet Eating Plan: www.dashdiet.org  Document Released: 02/03/2011 Document Revised: 05/09/2011 Document Reviewed: 02/03/2011  Exit[employer]® Patient Information ©2014 [employer].

## 2013-03-23 ENCOUNTER — Emergency Department (HOSPITAL_COMMUNITY): Payer: Medicare Other

## 2013-03-23 ENCOUNTER — Inpatient Hospital Stay (HOSPITAL_COMMUNITY)
Admission: EM | Admit: 2013-03-23 | Discharge: 2013-03-26 | DRG: 291 | Disposition: A | Discharge status: Home / Self Care | Payer: Medicare Other | Attending: Internal Medicine | Admitting: Internal Medicine

## 2013-03-23 ENCOUNTER — Encounter (HOSPITAL_COMMUNITY): Payer: Self-pay | Admitting: Emergency Medicine

## 2013-03-23 DIAGNOSIS — D61818 Other pancytopenia: Secondary | ICD-10-CM | POA: Diagnosis present

## 2013-03-23 DIAGNOSIS — B20 Human immunodeficiency virus [HIV] disease: Secondary | ICD-10-CM | POA: Diagnosis present

## 2013-03-23 DIAGNOSIS — M949 Disorder of cartilage, unspecified: Secondary | ICD-10-CM

## 2013-03-23 DIAGNOSIS — I5023 Acute on chronic systolic (congestive) heart failure: Principal | ICD-10-CM | POA: Diagnosis present

## 2013-03-23 DIAGNOSIS — N19 Unspecified kidney failure: Secondary | ICD-10-CM | POA: Diagnosis present

## 2013-03-23 DIAGNOSIS — Z79899 Other long term (current) drug therapy: Secondary | ICD-10-CM

## 2013-03-23 DIAGNOSIS — Z91158 Patient's noncompliance with renal dialysis for other reason: Secondary | ICD-10-CM

## 2013-03-23 DIAGNOSIS — E872 Acidosis, unspecified: Secondary | ICD-10-CM | POA: Diagnosis present

## 2013-03-23 DIAGNOSIS — D631 Anemia in chronic kidney disease: Secondary | ICD-10-CM | POA: Diagnosis present

## 2013-03-23 DIAGNOSIS — J9600 Acute respiratory failure, unspecified whether with hypoxia or hypercapnia: Secondary | ICD-10-CM | POA: Diagnosis present

## 2013-03-23 DIAGNOSIS — Z9115 Patient's noncompliance with renal dialysis: Secondary | ICD-10-CM

## 2013-03-23 DIAGNOSIS — J9601 Acute respiratory failure with hypoxia: Secondary | ICD-10-CM

## 2013-03-23 DIAGNOSIS — M899 Disorder of bone, unspecified: Secondary | ICD-10-CM | POA: Diagnosis present

## 2013-03-23 DIAGNOSIS — Z992 Dependence on renal dialysis: Secondary | ICD-10-CM

## 2013-03-23 DIAGNOSIS — N039 Chronic nephritic syndrome with unspecified morphologic changes: Secondary | ICD-10-CM

## 2013-03-23 DIAGNOSIS — E875 Hyperkalemia: Secondary | ICD-10-CM | POA: Diagnosis present

## 2013-03-23 DIAGNOSIS — N2581 Secondary hyperparathyroidism of renal origin: Secondary | ICD-10-CM | POA: Diagnosis present

## 2013-03-23 DIAGNOSIS — I120 Hypertensive chronic kidney disease with stage 5 chronic kidney disease or end stage renal disease: Secondary | ICD-10-CM | POA: Diagnosis present

## 2013-03-23 DIAGNOSIS — N186 End stage renal disease: Secondary | ICD-10-CM | POA: Insufficient documentation

## 2013-03-23 DIAGNOSIS — J810 Acute pulmonary edema: Secondary | ICD-10-CM

## 2013-03-23 LAB — COMPREHENSIVE METABOLIC PANEL
ALK PHOS: 93 U/L (ref 39–117)
ALT: 9 U/L (ref 0–35)
AST: 13 U/L (ref 0–37)
Albumin: 3.7 g/dL (ref 3.5–5.2)
BILIRUBIN TOTAL: 0.5 mg/dL (ref 0.3–1.2)
BUN: 121 mg/dL — ABNORMAL HIGH (ref 6–23)
CHLORIDE: 93 meq/L — AB (ref 96–112)
CO2: 17 meq/L — AB (ref 19–32)
Calcium: 7.1 mg/dL — ABNORMAL LOW (ref 8.4–10.5)
Creatinine, Ser: 22.94 mg/dL — ABNORMAL HIGH (ref 0.50–1.10)
GFR calc Af Amer: 2 mL/min — ABNORMAL LOW (ref >90)
GFR, EST NON AFRICAN AMERICAN: 1 mL/min — AB (ref >90)
Glucose, Bld: 95 mg/dL (ref 70–99)
Potassium: 6.4 mEq/L — ABNORMAL HIGH (ref 3.7–5.3)
SODIUM: 142 meq/L (ref 137–147)
Total Protein: 9.4 g/dL — ABNORMAL HIGH (ref 6.0–8.3)

## 2013-03-23 LAB — CBC WITH DIFFERENTIAL/PLATELET
BASOS ABS: 0 10*3/uL (ref 0.0–0.1)
Basophils Relative: 1 % (ref 0–1)
Eosinophils Absolute: 0.4 10*3/uL (ref 0.0–0.7)
Eosinophils Relative: 8 % — ABNORMAL HIGH (ref 0–5)
HEMATOCRIT: 28.3 % — AB (ref 36.0–46.0)
Hemoglobin: 9.2 g/dL — ABNORMAL LOW (ref 12.0–15.0)
LYMPHS PCT: 13 % (ref 12–46)
Lymphs Abs: 0.6 10*3/uL — ABNORMAL LOW (ref 0.7–4.0)
MCH: 27.8 pg (ref 26.0–34.0)
MCHC: 32.5 g/dL (ref 30.0–36.0)
MCV: 85.5 fL (ref 78.0–100.0)
MONOS PCT: 7 % (ref 3–12)
Monocytes Absolute: 0.3 10*3/uL (ref 0.1–1.0)
Neutro Abs: 3.3 10*3/uL (ref 1.7–7.7)
Neutrophils Relative %: 71 % (ref 43–77)
PLATELETS: 149 10*3/uL — AB (ref 150–400)
RBC: 3.31 MIL/uL — ABNORMAL LOW (ref 3.87–5.11)
RDW: 19.5 % — AB (ref 11.5–15.5)
WBC: 4.6 10*3/uL (ref 4.0–10.5)

## 2013-03-23 LAB — PRO B NATRIURETIC PEPTIDE: Pro B Natriuretic peptide (BNP): >70000 pg/mL — ABNORMAL HIGH (ref 0.0–100.0)

## 2013-03-23 LAB — TROPONIN I: Troponin I: <0.3 ng/mL (ref <0.30)

## 2013-03-23 MED ORDER — SODIUM CHLORIDE 0.9 % IV SOLN
1.0000 g | Freq: Once | INTRAVENOUS | Status: DC
  Filled 2013-03-23 (×2): qty 10

## 2013-03-23 MED ORDER — NITROGLYCERIN 2 % TD OINT
0.5000 [in_us] | TOPICAL_OINTMENT | TRANSDERMAL | Status: AC
  Administered 2013-03-23: 0.5 [in_us] via TOPICAL
  Filled 2013-03-23: qty 1

## 2013-03-23 MED ORDER — RALTEGRAVIR POTASSIUM 400 MG PO TABS
400.0000 mg | ORAL_TABLET | Freq: Two times a day (BID) | ORAL | Status: DC
  Administered 2013-03-24 – 2013-03-26 (×5): 400 mg via ORAL
  Filled 2013-03-23 (×9): qty 1

## 2013-03-23 MED ORDER — ABACAVIR SULFATE 300 MG PO TABS
300.0000 mg | ORAL_TABLET | Freq: Two times a day (BID) | ORAL | Status: DC
  Administered 2013-03-24 – 2013-03-26 (×5): 300 mg via ORAL
  Filled 2013-03-23 (×10): qty 1

## 2013-03-23 MED ORDER — SODIUM CHLORIDE 0.9 % IJ SOLN: 3.0000 mL | Freq: Two times a day (BID) | INTRAMUSCULAR | Status: DC

## 2013-03-23 MED ORDER — HEPARIN SODIUM (PORCINE) 5000 UNIT/ML IJ SOLN
5000.0000 [IU] | Freq: Three times a day (TID) | INTRAMUSCULAR | Status: DC
  Administered 2013-03-24 – 2013-03-26 (×7): 5000 [IU] via SUBCUTANEOUS
  Filled 2013-03-23 (×11): qty 1

## 2013-03-23 MED ORDER — SODIUM BICARBONATE 8.4 % IV SOLN
50.0000 meq | Freq: Once | INTRAVENOUS | Status: AC
  Administered 2013-03-24: 15 meq via INTRAVENOUS
  Filled 2013-03-23: qty 50

## 2013-03-23 MED ORDER — TEMAZEPAM 15 MG PO CAPS
15.0000 mg | ORAL_CAPSULE | Freq: Every evening | ORAL | Status: DC | PRN
  Administered 2013-03-24 – 2013-03-25 (×2): 15 mg via ORAL
  Filled 2013-03-23 (×3): qty 1

## 2013-03-23 MED ORDER — SODIUM POLYSTYRENE SULFONATE 15 GM/60ML PO SUSP
30.0000 g | Freq: Once | ORAL | Status: DC
  Filled 2013-03-23 (×2): qty 120

## 2013-03-23 MED ORDER — AMLODIPINE BESYLATE 10 MG PO TABS: 10.0000 mg | ORAL_TABLET | Freq: Every day | ORAL | Status: DC

## 2013-03-23 MED ORDER — CITALOPRAM HYDROBROMIDE 20 MG PO TABS
20.0000 mg | ORAL_TABLET | Freq: Every day | ORAL | Status: DC
  Administered 2013-03-24 – 2013-03-26 (×3): 20 mg via ORAL
  Filled 2013-03-23 (×5): qty 1

## 2013-03-23 MED ORDER — TENOFOVIR DISOPROXIL FUMARATE 300 MG PO TABS
300.0000 mg | ORAL_TABLET | ORAL | Status: DC
  Administered 2013-03-26: 300 mg via ORAL
  Filled 2013-03-23: qty 1

## 2013-03-23 MED ORDER — RENA-VITE PO TABS
1.0000 | ORAL_TABLET | Freq: Every day | ORAL | Status: DC
  Administered 2013-03-24 – 2013-03-25 (×2): 1 via ORAL
  Filled 2013-03-23 (×6): qty 1

## 2013-03-23 MED ORDER — ALBUTEROL SULFATE (2.5 MG/3ML) 0.083% IN NEBU
5.0000 mg | INHALATION_SOLUTION | Freq: Once | RESPIRATORY_TRACT | Status: AC
  Administered 2013-03-23: 5 mg via RESPIRATORY_TRACT
  Filled 2013-03-23: qty 6

## 2013-03-23 MED ORDER — CINACALCET HCL 30 MG PO TABS
30.0000 mg | ORAL_TABLET | Freq: Every day | ORAL | Status: DC
  Administered 2013-03-24 – 2013-03-25 (×2): 30 mg via ORAL
  Filled 2013-03-23 (×5): qty 1

## 2013-03-23 MED ORDER — SEVELAMER CARBONATE 2.4 G PO PACK
2.4000 g | PACK | Freq: Three times a day (TID) | ORAL | Status: DC
  Filled 2013-03-23: qty 1

## 2013-03-23 MED ORDER — ETRAVIRINE 100 MG PO TABS
200.0000 mg | ORAL_TABLET | Freq: Two times a day (BID) | ORAL | Status: DC
  Administered 2013-03-24 – 2013-03-26 (×4): 200 mg via ORAL
  Filled 2013-03-23 (×8): qty 2

## 2013-03-23 NOTE — ED Notes (Signed)
MD at bedside. 

## 2013-03-23 NOTE — ED Notes (Signed)
Called radiology for dg port.

## 2013-03-23 NOTE — ED Notes (Signed)
Pt reports increased SOB since yesterday; hasn't received dialysis in 10 days dt not being able to get transportation. Pt is scheduled to go MWF. Pt reports decrease in SOB with Bi-pap in place. AOx4, responding appropriately. Denies any pain.

## 2013-03-23 NOTE — ED Provider Notes (Signed)
CSN: [id number]     Arrival date & time 03/23/13  2010 History   First MD Initiated Contact with Patient 03/23/13 2026     Chief Complaint  Patient presents with  . Shortness of Breath   (Consider location/radiation/quality/duration/timing/severity/associated sxs/prior Treatment) Patient is a 53 y.o. female presenting with shortness of breath. The history is provided by the patient and the EMS personnel.  Shortness of Breath Severity:  Moderate Onset quality:  Gradual Duration:  3 days Timing:  Constant Progression:  Worsening Chronicity:  New Context comment:  At rest Relieved by:  Nothing Worsened by:  Nothing tried Ineffective treatments:  None tried Associated symptoms: cough   Associated symptoms: no abdominal pain, no chest pain, no fever, no headaches, no neck pain and no vomiting     Past Medical History  Diagnosis Date  . 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  [place] HD in [place] in 2006, [place] and [place] in [place].  Was receiving HD TTS schedule with a [facility].  Her nephrologist was Dr Vinnie Langtonada.  [place] and was not able to set up new HD unit prior to coming.  Last HD was Tuesday July 29th.  HD access is R FA AVF.  Gets heparin at the center, dry weight was 56kg, ran 3 hours.     [name]. HIV disease 09/29/2012    Diagnosed on 2000, on HIV meds.  On ART, viral levels undetectable per pt.     [name]. 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    . Hypertension 09/29/2012   Past Surgical History  Procedure Laterality Date  . Hernia repair     Family History  Problem Relation Age of Onset  . Diabetes Mother   . Hypertension Mother   . Hypertension Sister   . Diabetes Sister    History  Substance Use Topics  . Smoking status: Never Smoker   . Smokeless  tobacco: Not on file  . Alcohol Use: No   OB History   Grav Para Term Preterm Abortions TAB SAB Ect Mult Living                 Review of Systems  Constitutional: Negative for fever and fatigue.  HENT: Negative for congestion and drooling.   Eyes: Negative for pain.  Respiratory: Positive for cough and shortness of breath.   Cardiovascular: Negative for chest pain.  Gastrointestinal: Negative for nausea, vomiting, abdominal pain and diarrhea.  Genitourinary: Negative for dysuria and hematuria.  Musculoskeletal: Negative for back pain, gait problem and neck pain.  Skin: Negative for color change.  Neurological: Negative for dizziness and headaches.  Hematological: Negative for adenopathy.  Psychiatric/Behavioral: Negative for behavioral problems.  All other systems reviewed and are negative.    Allergies  Benadryl and Latex  Home Medications   Current Outpatient Rx  Name  Route  Sig  Dispense  Refill  . abacavir (ZIAGEN) 300 MG tablet   Oral   Take 1 tablet (300 mg total) by mouth 2 (two) times daily.   60 tablet   2   . amLODipine (NORVASC) 10 MG tablet   Oral   Take 10 mg by mouth daily.         . benzonatate (TESSALON PERLES)  100 MG capsule   Oral   Take 1 capsule (100 mg total) by mouth 3 (three) times daily as needed for cough.   20 capsule   0   . Chlorpheniramine-DM (CORICIDIN COUGH/COLD) 4-30 MG TABS   Oral   Take 1 tablet by mouth daily as needed.         . cinacalcet (SENSIPAR) 30 MG tablet   Oral   Take 30 mg by mouth at bedtime.         . citalopram (CELEXA) 20 MG tablet   Oral   Take 20 mg by mouth daily.         [name] etravirine (INTELENCE) 100 MG tablet   Oral   Take 2 tablets (200 mg total) by mouth 2 (two) times daily with a meal.   60 tablet   2   . HYDROcodone-acetaminophen (NORCO/VICODIN) 5-325 MG per tablet   Oral   Take 1 tablet by mouth every 6 (six) hours as needed for pain.         [name] ibuprofen (ADVIL,MOTRIN) 200 MG  tablet   Oral   Take 400 mg by mouth every 6 (six) hours as needed for pain.         [name] lisinopril (PRINIVIL,ZESTRIL) 20 MG tablet   Oral   Take 1 tablet (20 mg total) by mouth daily.   30 tablet   6   . metoprolol succinate (TOPROL-XL) 50 MG 24 hr tablet   Oral   Take 1 tablet (50 mg total) by mouth 2 (two) times daily. Take with or immediately following a meal.   60 tablet   0   . multivitamin (RENA-VIT) TABS tablet   Oral   Take 1 tablet by mouth daily.   90 tablet   0   . oxyCODONE-acetaminophen (ENDOCET) 5-325 MG per tablet   Oral   Take 1 tablet by mouth every 4 (four) hours as needed for pain.         . raltegravir (ISENTRESS) 400 MG tablet   Oral   Take 1 tablet (400 mg total) by mouth 2 (two) times daily.   60 tablet   2   . sevelamer carbonate (RENVELA) 2.4 G PACK   Oral   Take 2.4 g by mouth 3 (three) times daily with meals.   90 each   6   . temazepam (RESTORIL) 15 MG capsule   Oral   Take 15 mg by mouth at bedtime as needed for sleep.         [name] tenofovir (VIREAD) 300 MG tablet   Oral   Take 1 tablet (300 mg total) by mouth every 7 (seven) days.   4 tablet   2   . tetrahydrozoline 0.05 % ophthalmic solution   Both Eyes   Place 2 drops into both eyes daily as needed (for dry eyes).          BP 213/105  Pulse 88  Temp(Src) 97.7 F (36.5 C) (Oral)  Resp 40 Physical Exam  Nursing note and vitals reviewed. Constitutional: She is oriented to person, place, and time. She appears well-developed and well-nourished.  HENT:  Head: Normocephalic.  Mouth/Throat: Oropharynx is clear and moist. No oropharyngeal exudate.  Eyes: Conjunctivae and EOM are normal. Pupils are equal, round, and reactive to light.  Neck: Normal range of motion. Neck supple.  Cardiovascular: Normal rate, regular rhythm, normal heart sounds and intact distal pulses.  Exam reveals no gallop and no friction rub.  No murmur heard. Pulmonary/Chest: She is in respiratory  distress.   decreased breath sounds diffusely.  Abdominal: Soft. Bowel sounds are normal. There is no tenderness. There is no rebound and no guarding.  Musculoskeletal: Normal range of motion. She exhibits no edema and no tenderness.  Neurological: She is alert and oriented to person, place, and time.  Skin: Skin is warm and dry.  Psychiatric: She has a normal mood and affect. Her behavior is normal.    ED Course  Procedures (including critical care time) Labs Review Labs Reviewed  CBC WITH DIFFERENTIAL - Abnormal; Notable for the following:    RBC 3.31 (*)    Hemoglobin 9.2 (*)    HCT 28.3 (*)    RDW 19.5 (*)    Platelets 149 (*)    Eosinophils Relative 8 (*)    Lymphs Abs 0.6 (*)    All other components within normal limits  COMPREHENSIVE METABOLIC PANEL - Abnormal; Notable for the following:    Potassium 6.4 (*)    Chloride 93 (*)    CO2 17 (*)    BUN 121 (*)    Creatinine, Ser 22.94 (*)    Calcium 7.1 (*)    Total Protein 9.4 (*)    GFR calc non Af Amer 1 (*)    GFR calc Af Amer 2 (*)    All other components within normal limits  PRO B NATRIURETIC PEPTIDE - Abnormal; Notable for the following:    Pro B Natriuretic peptide (BNP) >70000.0 (*)    All other components within normal limits  CBC - Abnormal; Notable for the following:    WBC 3.0 (*)    RBC 2.74 (*)    Hemoglobin 7.5 (*)    HCT 23.1 (*)    RDW 19.4 (*)    Platelets 132 (*)    All other components within normal limits  BASIC METABOLIC PANEL - Abnormal; Notable for the following:    Chloride 94 (*)    BUN 48 (*)    Creatinine, Ser 11.91 (*)    Calcium 7.7 (*)    GFR calc non Af Amer 3 (*)    GFR calc Af Amer 4 (*)    All other components within normal limits  RENAL FUNCTION PANEL - Abnormal; Notable for the following:    Potassium 6.3 (*)    Chloride 94 (*)    CO2 18 (*)    BUN 122 (*)    Creatinine, Ser 23.23 (*)    Calcium 6.8 (*)    Phosphorus 10.3 (*)    Albumin 3.3 (*)    GFR calc non Af  Amer 1 (*)    GFR calc Af Amer 2 (*)    All other components within normal limits  TROPONIN I  DRUG SCREEN PANEL (SERUM)   Imaging Review Dg Chest Port 1 View  03/23/2013   CLINICAL DATA:  Shortness of breath.  EXAM: PORTABLE CHEST - 1 VIEW  COMPARISON:  11/17/2012.  FINDINGS: Severe cardiomegaly and bilateral pulmonary alveolar infiltrates consistent with pulmonary edema noted. No pneumothorax. No acute osseous abnormality.  IMPRESSION: Severe cardiomegaly with dense bilateral pulmonary alveolar infiltrates consistent with congestive heart failure with pulmonary edema. Superimposed pneumonia cannot be excluded.   Electronically Signed   By: Maisie Fus  Register   On: 03/23/2013 22:03    EKG Interpretation    Date/Time:  Saturday March 23 2013 20:18:07 EST Ventricular Rate:  87 PR Interval:  192 QRS Duration: 97 QT Interval:  408  QTC Calculation: 491 R Axis:   11 Text Interpretation:  Sinus rhythm Left atrial enlargement RSR' in V1 or V2, right VCD or RVH Nonspecific T abnormalities, lateral leads Borderline prolonged QT interval Confirmed by Malaijah Houchen  MD, Aariyana Manz ([id number]) on 03/23/2013 11:24:17 PM           CRITICAL CARE Performed by: Purvis Sheffield, S Total critical care time: 40 min Critical care time was exclusive of separately billable procedures and treating other patients. Critical care was necessary to treat or prevent imminent or life-threatening deterioration. Critical care was time spent personally by me on the following activities: development of treatment plan with patient and/or surrogate as well as nursing, discussions with consultants, evaluation of patient's response to treatment, examination of patient, obtaining history from patient or surrogate, ordering and performing treatments and interventions, ordering and review of laboratory studies, ordering and review of radiographic studies, pulse oximetry and re-evaluation of patient's condition.  MDM   1. Dialysis  patient, noncompliant   2. Acute respiratory failure   3. Acute pulmonary edema   4. ESRD on dialysis   5. Hyperkalemia    8:32 PM 53 y.o. female with a history of HIV, end-stage renal disease on hemodialysis who presents with gradual worsening of shortness of breath for the last few days. The patient reports that she's not been to dialysis in approximately 10 days. She notes mild nonproductive cough, but denies any fevers, chest pain, or diarrhea. The patient has a history of hypertension and is hypertensive here. She is also tachypneic. Will get labs and imaging. Will place nitro past to chest for BP/sob. Will place on bipap.   Discussed w/ on call nephrology. Will order kayexalate as well as bicarb, Ca gluc, alb to temporize d/t elevated potassium. IV access delayed in the ED d/t pt's difficult anatomy w/ multiple attempts by nursing, IV team, and me.   Will admit to hospitalist. Pt will need dialysis.     Junius Argyle, MD 03/24/13 1135

## 2013-03-23 NOTE — ED Notes (Signed)
IV team at bedside. Heat packs applied to arms. Pt tolerating well. Md made aware.

## 2013-03-23 NOTE — ED Notes (Signed)
IV team notified to attempt start on pt.

## 2013-03-23 NOTE — ED Notes (Signed)
IV team attempted multiple times with no IV success. Pt tolerated well. MD made aware.

## 2013-03-23 NOTE — Progress Notes (Signed)
Patient taken off Bipap and placed on 3L nasal cannula at this time. Tolerating well, RT will continue to monitor. 

## 2013-03-23 NOTE — ED Notes (Signed)
Presents with SOB, Dialysis pt has not been in 10 days due to lack of transportation, coarse crackles and rhonchi, +use of accessory muscles.  RR 30-40, unable to obtain SAT, speaking in short phrases.

## 2013-03-23 NOTE — H&P (Signed)
[facility] History and Physical  Bethany MustacheGloria [name] ZOX:[id number] DOB: 1961/01/13 DOA: 03/23/2013  Referring physician: EDP PCP: Jeanann LewandowskyJEGEDE, OLUGBEMIGA, MD   Chief Complaint: SOB   HPI: Bethany [name] is a 53 y.o. female with ESRD supposed to be on dialysis, who presents to the ED with SOB.  Symptoms have been gradually worsening for the past 3 days.  They occur in the context of skipping dialysis for the past 10 days.  Work up in the ED demonstrates fluid overload, symptoms improved on BIPAP, other lab findings include hyperkalemia of 6.4 and uremia of 121.  Nephrology has been called and is arranging for dialysis, hospitalist is admitting.  Review of Systems: Systems reviewed.  As above, otherwise negative  Past Medical History  Diagnosis Date  . 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  [place] in [place] 2006, [place] in [place].  Was receiving HD TTS schedule with a [facility].  Her nephrologist was Dr Vinnie Langtonada.  [place], [place] and was not able to set up new HD unit prior to coming.  Last HD was Tuesday July 29th.  HD access is R FA AVF.  Gets heparin at the center, dry weight was 56kg, ran 3 hours.     [name]. HIV disease 09/29/2012    Diagnosed on 2000, on HIV meds.  On ART, viral levels undetectable per pt.     [name]. 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    . Hypertension 09/29/2012   Past Surgical History  Procedure Laterality Date  . Hernia repair     Social History:  reports that she has never smoked. She does not have any smokeless tobacco history on file. She reports that she does not drink alcohol or use illicit drugs.  Allergies  Allergen Reactions  . Benadryl [Diphenhydramine Hcl] Other (See Comments)    Makes patient jittery  . Latex Hives and Itching     Family History  Problem Relation Age of Onset  . Diabetes Mother   . Hypertension Mother   . Hypertension Sister   . Diabetes Sister      Prior to Admission medications   Medication Sig Start Date End Date Taking? Authorizing Provider  abacavir (ZIAGEN) 300 MG tablet Take 1 tablet (300 mg total) by mouth 2 (two) times daily. 03/12/13  Yes Jeanann Lewandowskylugbemiga Jegede, MD  amLODipine (NORVASC) 10 MG tablet Take 10 mg by mouth daily.   Yes Historical Provider, MD  benzonatate (TESSALON PERLES) 100 MG capsule Take 1 capsule (100 mg total) by mouth 3 (three) times daily as needed for cough. 11/17/12  Yes Penny Piarlando Vega, MD  cinacalcet (SENSIPAR) 30 MG tablet Take 30 mg by mouth at bedtime.   Yes Historical Provider, MD  citalopram (CELEXA) 20 MG tablet Take 20 mg by mouth daily.   Yes Historical Provider, MD  etravirine (INTELENCE) 100 MG tablet Take 2 tablets (200 mg total) by mouth 2 (two) times daily with a meal. 03/12/13  Yes Jeanann Lewandowskylugbemiga Jegede, MD  multivitamin (RENA-VIT) TABS tablet Take 1 tablet by mouth daily. 10/02/12  Yes Jay K. Allena KatzPatel, MD  raltegravir (ISENTRESS) 400 MG tablet Take 1 tablet (400 mg total) by mouth 2 (two) times daily. 03/12/13  Yes Jeanann Lewandowskylugbemiga Jegede, MD  sevelamer carbonate (  RENVELA) 2.4 G PACK Take 2.4 g by mouth 3 (three) times daily with meals. 10/02/12  Yes Jay K. Allena Katz, MD  temazepam (RESTORIL) 15 MG capsule Take 15 mg by mouth at bedtime as needed for sleep.   Yes Historical Provider, MD  tenofovir (VIREAD) 300 MG tablet Take 1 tablet (300 mg total) by mouth every 7 (seven) days. 03/12/13  Yes [name], MD  tetrahydrozoline 0.05 % ophthalmic solution Place 2 drops into both eyes daily as needed (for dry eyes).   Yes Historical Provider, MD   Physical Exam: Filed Vitals:   03/23/13 2245  BP:   Pulse:   Temp:   Resp: 22    BP 186/101  Pulse 72  Temp(Src) 97.7 F (36.5 C) (Oral)  Resp 22  SpO2 98%  General Appearance:    Alert, oriented, no distress, appears  stated age  Head:    Normocephalic, atraumatic  Eyes:    PERRL, EOMI, sclera non-icteric        Nose:   Nares without drainage or epistaxis. Mucosa, turbinates normal  Throat:   Moist mucous membranes. Oropharynx without erythema or exudate.  Neck:   Supple. No carotid bruits.  No thyromegaly.  No lymphadenopathy.   Back:     No CVA tenderness, no spinal tenderness  Lungs:     Clear to auscultation bilaterally, without wheezes, rhonchi or rales  Chest wall:    No tenderness to palpitation  Heart:    Regular rate and rhythm without murmurs, gallops, rubs  Abdomen:     Soft, non-tender, nondistended, normal bowel sounds, no organomegaly  Genitalia:    deferred  Rectal:    deferred  Extremities:   No clubbing, cyanosis or edema.  Pulses:   2+ and symmetric all extremities  Skin:   Skin color, texture, turgor normal, no rashes or lesions  Lymph nodes:   Cervical, supraclavicular, and axillary nodes normal  Neurologic:   CNII-XII intact. Normal strength, sensation and reflexes      throughout    Labs on Admission:  Basic Metabolic Panel:  Recent Labs Lab 03/23/13 2102  NA 142  K 6.4*  CL 93*  CO2 17*  GLUCOSE 95  BUN 121*  CREATININE 22.94*  CALCIUM 7.1*   Liver Function Tests:  Recent Labs Lab 03/23/13 2102  AST 13  ALT 9  ALKPHOS 93  BILITOT 0.5  PROT 9.4*  ALBUMIN 3.7   No results found for this basename: LIPASE, AMYLASE,  in the last 168 hours No results found for this basename: AMMONIA,  in the last 168 hours CBC:  Recent Labs Lab 03/23/13 2102  WBC 4.6  NEUTROABS 3.3  HGB 9.2*  HCT 28.3*  MCV 85.5  PLT 149*   Cardiac Enzymes:  Recent Labs Lab 03/23/13 2103  TROPONINI <0.30    BNP (last 3 results)  Recent Labs  11/15/12 1805 03/23/13 2103  PROBNP >70000.0* >70000.0*   CBG: No results found for this basename: GLUCAP,  in the last 168 hours  Radiological Exams on Admission: Dg Chest Port 1 View  03/23/2013   CLINICAL DATA:  Shortness  of breath.  EXAM: PORTABLE CHEST - 1 VIEW  COMPARISON:  11/17/2012.  FINDINGS: Severe cardiomegaly and bilateral pulmonary alveolar infiltrates consistent with pulmonary edema noted. No pneumothorax. No acute osseous abnormality.  IMPRESSION: Severe cardiomegaly with dense bilateral pulmonary alveolar infiltrates consistent with congestive heart failure with pulmonary edema. Superimposed pneumonia cannot be excluded.   Electronically Signed   By: Maisie Fus  Register   On: 03/23/2013 22:03    EKG: Independently reviewed.  Assessment/Plan Principal Problem:   Dialysis patient, noncompliant Active Problems:   ESRD on hemodialysis   HIV disease   Uremia   Acute respiratory failure   Hyperkalemia   1. ESRD - noncompliance with dialysis resulting in pulmonary edema and fluid overload as well as hyperkalemia and uremia.  BIPAP for acute respiratory failure, temporarizing measures for hyperkalemia, but most importantly the patient is now being admitted for HD (tonight from the sounds of it), to correct these.  Emphasized the importance of compliance with HD in the future to the patient. 2. HIV - continue HAART medications    Code Status: Full Code  Family Communication: Family at bedside Disposition Plan: Admit to inpatient   Time spent: 50 min  GARDNER, JARED M. [facility] Pager [phone]  If 7AM-7PM, please contact the day team taking care of the patient [email or web address] Password [email or web address] 03/23/2013, 11:32 PM

## 2013-03-24 ENCOUNTER — Inpatient Hospital Stay (HOSPITAL_COMMUNITY): Payer: Medicare Other

## 2013-03-24 ENCOUNTER — Encounter (HOSPITAL_COMMUNITY): Payer: Self-pay | Admitting: *Deleted

## 2013-03-24 DIAGNOSIS — I5023 Acute on chronic systolic (congestive) heart failure: Secondary | ICD-10-CM | POA: Diagnosis present

## 2013-03-24 DIAGNOSIS — I509 Heart failure, unspecified: Secondary | ICD-10-CM

## 2013-03-24 DIAGNOSIS — Z992 Dependence on renal dialysis: Secondary | ICD-10-CM

## 2013-03-24 LAB — BASIC METABOLIC PANEL
BUN: 48 mg/dL — AB (ref 6–23)
CO2: 27 mEq/L (ref 19–32)
Calcium: 7.7 mg/dL — ABNORMAL LOW (ref 8.4–10.5)
Chloride: 94 mEq/L — ABNORMAL LOW (ref 96–112)
Creatinine, Ser: 11.91 mg/dL — ABNORMAL HIGH (ref 0.50–1.10)
GFR, EST AFRICAN AMERICAN: 4 mL/min — AB (ref >90)
GFR, EST NON AFRICAN AMERICAN: 3 mL/min — AB (ref >90)
Glucose, Bld: 96 mg/dL (ref 70–99)
POTASSIUM: 4.5 meq/L (ref 3.7–5.3)
SODIUM: 139 meq/L (ref 137–147)

## 2013-03-24 LAB — RENAL FUNCTION PANEL
Albumin: 3.3 g/dL — ABNORMAL LOW (ref 3.5–5.2)
BUN: 122 mg/dL — ABNORMAL HIGH (ref 6–23)
CALCIUM: 6.8 mg/dL — AB (ref 8.4–10.5)
CHLORIDE: 94 meq/L — AB (ref 96–112)
CO2: 18 mEq/L — ABNORMAL LOW (ref 19–32)
CREATININE: 23.23 mg/dL — AB (ref 0.50–1.10)
GFR calc Af Amer: 2 mL/min — ABNORMAL LOW (ref >90)
GFR, EST NON AFRICAN AMERICAN: 1 mL/min — AB (ref >90)
Glucose, Bld: 87 mg/dL (ref 70–99)
Phosphorus: 10.3 mg/dL — ABNORMAL HIGH (ref 2.3–4.6)
Potassium: 6.3 mEq/L — ABNORMAL HIGH (ref 3.7–5.3)
Sodium: 141 mEq/L (ref 137–147)

## 2013-03-24 LAB — CBC
HEMATOCRIT: 23.1 % — AB (ref 36.0–46.0)
Hemoglobin: 7.5 g/dL — ABNORMAL LOW (ref 12.0–15.0)
MCH: 27.4 pg (ref 26.0–34.0)
MCHC: 32.5 g/dL (ref 30.0–36.0)
MCV: 84.3 fL (ref 78.0–100.0)
Platelets: 132 10*3/uL — ABNORMAL LOW (ref 150–400)
RBC: 2.74 MIL/uL — ABNORMAL LOW (ref 3.87–5.11)
RDW: 19.4 % — ABNORMAL HIGH (ref 11.5–15.5)
WBC: 3 10*3/uL — ABNORMAL LOW (ref 4.0–10.5)

## 2013-03-24 MED ORDER — AMLODIPINE BESYLATE 10 MG PO TABS
10.0000 mg | ORAL_TABLET | Freq: Every day | ORAL | Status: DC
  Administered 2013-03-24 – 2013-03-25 (×3): 10 mg via ORAL
  Filled 2013-03-24 (×6): qty 1

## 2013-03-24 MED ORDER — PENTAFLUOROPROP-TETRAFLUOROETH EX AERO: 1.0000 "application " | INHALATION_SPRAY | CUTANEOUS | Status: DC | PRN

## 2013-03-24 MED ORDER — SODIUM CHLORIDE 0.9 % IV SOLN: 100.0000 mL | INTRAVENOUS | Status: DC | PRN

## 2013-03-24 MED ORDER — AMLODIPINE BESYLATE 5 MG PO TABS
5.0000 mg | ORAL_TABLET | Freq: Every day | ORAL | Status: DC
  Filled 2013-03-24: qty 1

## 2013-03-24 MED ORDER — LIDOCAINE-PRILOCAINE 2.5-2.5 % EX CREA: 1.0000 "application " | TOPICAL_CREAM | CUTANEOUS | Status: DC | PRN

## 2013-03-24 MED ORDER — NEPRO/CARBSTEADY PO LIQD: 237.0000 mL | ORAL | Status: DC | PRN

## 2013-03-24 MED ORDER — DOXERCALCIFEROL 4 MCG/2ML IV SOLN
10.0000 ug | INTRAVENOUS | Status: DC
  Administered 2013-03-25: 10 ug via INTRAVENOUS
  Filled 2013-03-24: qty 6

## 2013-03-24 MED ORDER — ALTEPLASE 2 MG IJ SOLR
2.0000 mg | Freq: Once | INTRAMUSCULAR | Status: DC | PRN
  Filled 2013-03-24: qty 2

## 2013-03-24 MED ORDER — NEPRO/CARBSTEADY PO LIQD
237.0000 mL | Freq: Two times a day (BID) | ORAL | Status: DC
  Administered 2013-03-24 – 2013-03-26 (×3): 237 mL via ORAL

## 2013-03-24 MED ORDER — HEPARIN SODIUM (PORCINE) 1000 UNIT/ML DIALYSIS: 1000.0000 [IU] | INTRAMUSCULAR | Status: DC | PRN

## 2013-03-24 MED ORDER — DARBEPOETIN ALFA-POLYSORBATE 200 MCG/0.4ML IJ SOLN
200.0000 ug | INTRAMUSCULAR | Status: DC
  Administered 2013-03-25: 200 ug via INTRAVENOUS
  Filled 2013-03-24: qty 0.4

## 2013-03-24 MED ORDER — SEVELAMER CARBONATE 2.4 G PO PACK
4.8000 g | PACK | Freq: Three times a day (TID) | ORAL | Status: DC
  Administered 2013-03-24 – 2013-03-26 (×6): 4.8 g via ORAL
  Filled 2013-03-24 (×12): qty 2

## 2013-03-24 MED ORDER — LIDOCAINE HCL (PF) 1 % IJ SOLN: 5.0000 mL | INTRAMUSCULAR | Status: DC | PRN

## 2013-03-24 MED ORDER — HEPARIN SODIUM (PORCINE) 1000 UNIT/ML DIALYSIS
100.0000 [IU]/kg | INTRAMUSCULAR | Status: DC | PRN
  Administered 2013-03-24: 5800 [IU] via INTRAVENOUS_CENTRAL

## 2013-03-24 NOTE — ED Notes (Signed)
Admitting Md notified of IV inflitrate and small dose of Bicarb given; Admitting request pt be transferred to floor for dialysis.

## 2013-03-24 NOTE — Progress Notes (Signed)
Attempted to get report from ED RN. Stated they were busy and would call back.  Sports administratoryanne HIll BSN RN  [facility] [place] [phone]

## 2013-03-24 NOTE — Procedures (Signed)
I was present at this session.  I have reviewed the session itself and made appropriate changes. Starting HD via RLA AVF.  Severe vol xs  [name] L 1/25/20151:27 AM

## 2013-03-24 NOTE — Consult Note (Signed)
Reason for Consult:Vol xs , hyperkalemia , ESRD Referring Physician: Hospitalist  Quinlan Mcfall is an 53 y.o. female.  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  Has not been to dialysis for a week. Claims cannot get down stairs with walker to transportation.  Husband lives in home and does not assist as claims he is at work.  Dialyzes @ [facility].  SOB since this am and did not call dialysis to go or get assistance.  This is a recurrent problem.  Coughing thick whitish phlegm. Cannot lie down and breathe.  No fevers,chills, NV. Constitutional: as above Eyes: negative Ears, nose, mouth, throat, and face: negative Respiratory: as above Cardiovascular: as above, also edema Gastrointestinal: negative Genitourinary:negative Integument/breast: negative Musculoskeletal:negative Neurological: prob walking, not new   Dialyzes at [facility] on MWF since 09/2012. Primary Nephrologist [place]. EDW. HD Bath 2K, Dialyzer 180, Heparin strd. Access RLA AVF.  Past Medical History  Diagnosis Date  . 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  Started HD in [place] in 2006, moved to [place] and rec'd dialysis in [place] starting in 2009.  Was receiving HD TTS schedule with a [facility].  Her nephrologist was Dr Bufford Lope.  She moved to [facility] this week (Sep 29, 2012) and was not able to set up new HD unit prior to coming.  Last HD was Tuesday July 29th.  HD access is R FA AVF.  Gets heparin at the center, dry weight was 56kg, ran 3 hours.     [name] HIV disease 09/29/2012    Diagnosed on 2000, on HIV meds.  On ART, viral levels undetectable per pt.     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    . Hypertension 09/29/2012    Past Surgical History  Procedure Laterality Date  . Hernia repair      Family History  Problem Relation Age of Onset  . Diabetes Mother   . Hypertension  Mother   . Hypertension Sister   . Diabetes Sister     Social History:  reports that she has never smoked. She does not have any smokeless tobacco history on file. She reports that she does not drink alcohol or use illicit drugs.  Allergies:  Allergies  Allergen Reactions  . Benadryl [Diphenhydramine Hcl] Other (See Comments)    Makes patient jittery  . Latex Hives and Itching    Medications: I have reviewed the patient's current medications. Prior to Admission:  (Not in a hospital admission)   Results for orders placed during the hospital encounter of 03/23/13 (from the past 48 hour(s))  CBC WITH DIFFERENTIAL     Status: Abnormal   Collection Time    03/23/13  9:02 PM      Result Value Range   WBC 4.6  4.0 - 10.5 K/uL   RBC 3.31 (*) 3.87 - 5.11 MIL/uL   Hemoglobin 9.2 (*) 12.0 - 15.0 g/dL   HCT 28.3 (*) 36.0 - 46.0 %   MCV 85.5  78.0 - 100.0 fL   MCH 27.8  26.0 - 34.0 pg   MCHC 32.5  30.0 - 36.0 g/dL   RDW 19.5 (*) 11.5 - 15.5 %   Platelets 149 (*) 150 - 400 K/uL   Comment: PLATELET COUNT CONFIRMED BY SMEAR     REPEATED TO VERIFY   Neutrophils Relative %  71  43 - 77 %   Lymphocytes Relative 13  12 - 46 %   Monocytes Relative 7  3 - 12 %   Eosinophils Relative 8 (*) 0 - 5 %   Basophils Relative 1  0 - 1 %   Neutro Abs 3.3  1.7 - 7.7 K/uL   Lymphs Abs 0.6 (*) 0.7 - 4.0 K/uL   Monocytes Absolute 0.3  0.1 - 1.0 K/uL   Eosinophils Absolute 0.4  0.0 - 0.7 K/uL   Basophils Absolute 0.0  0.0 - 0.1 K/uL  COMPREHENSIVE METABOLIC PANEL     Status: Abnormal   Collection Time    03/23/13  9:02 PM      Result Value Range   Sodium 142  137 - 147 mEq/L   Potassium 6.4 (*) 3.7 - 5.3 mEq/L   Chloride 93 (*) 96 - 112 mEq/L   CO2 17 (*) 19 - 32 mEq/L   Glucose, Bld 95  70 - 99 mg/dL   BUN 121 (*) 6 - 23 mg/dL   Creatinine, Ser 22.94 (*) 0.50 - 1.10 mg/dL   Calcium 7.1 (*) 8.4 - 10.5 mg/dL   Total Protein 9.4 (*) 6.0 - 8.3 g/dL   Albumin 3.7  3.5 - 5.2 g/dL   AST 13  0 - 37  U/L   ALT 9  0 - 35 U/L   Alkaline Phosphatase 93  39 - 117 U/L   Total Bilirubin 0.5  0.3 - 1.2 mg/dL   GFR calc non Af Amer 1 (*) >90 mL/min   GFR calc Af Amer 2 (*) >90 mL/min   Comment: (NOTE)     The eGFR has been calculated using the CKD EPI equation.     This calculation has not been validated in all clinical situations.     eGFR's persistently <90 mL/min signify possible Chronic Kidney     Disease.  TROPONIN I     Status: None   Collection Time    03/23/13  9:03 PM      Result Value Range   Troponin I <0.30  <0.30 ng/mL   Comment:            Due to the release kinetics of cTnI,     a negative result within the first hours     of the onset of symptoms does not rule out     myocardial infarction with certainty.     If myocardial infarction is still suspected,     repeat the test at appropriate intervals.  PRO B NATRIURETIC PEPTIDE     Status: Abnormal   Collection Time    03/23/13  9:03 PM      Result Value Range   Pro B Natriuretic peptide (BNP) >70000.0 (*) 0.0 - 100.0 pg/mL    Dg Chest Port 1 View  03/23/2013   CLINICAL DATA:  Shortness of breath.  EXAM: PORTABLE CHEST - 1 VIEW  COMPARISON:  11/17/2012.  FINDINGS: Severe cardiomegaly and bilateral pulmonary alveolar infiltrates consistent with pulmonary edema noted. No pneumothorax. No acute osseous abnormality.  IMPRESSION: Severe cardiomegaly with dense bilateral pulmonary alveolar infiltrates consistent with congestive heart failure with pulmonary edema. Superimposed pneumonia cannot be excluded.   Electronically Signed   By: Marcello Moores  Register   On: 03/23/2013 22:03    ROS Blood pressure 186/101, pulse 72, temperature 97.7 F (36.5 C), temperature source Oral, resp. rate 22, SpO2 98.00%. Physical Exam Physical Examination: General appearance - mildly ill, not overtly dyspneic  on 3 L Mental status - alert, oriented to person, place, and time Eyes - silver wiring, AV nicking, scarring, exudates Ears - bilateral TM's  and external ear canals normal Mouth - dental hygiene poor Neck - adenopathy noted PCL Lymphatics - posterior cervical nodes Chest - rales noted throughout, decreased air entry noted bilat Heart - normal rate, regular rhythm, normal S1, S2, no murmurs, rubs, clicks or gallops, S1 and S2 normal, S4 present, systolic murmur 3/6 at apex and LV lift Abdomen - pos bs,liver down 5 cm Musculoskeletal - no joint tenderness, deformity or swelling Extremities - pedal edema 2 +, intact peripheral pulses Skin - dry, thickened in places  Assessment/Plan: 1 ESRD with vol xs, ^ k, acidemia, and ^^solute.  Poor adherence.  Needs HD tonight. Access functional 2 NONADHERENCE primary issue and has means to comply but chooses not to do so.  Needs psych input, ? Palliative Care 3 Hypertension: vol and meds.  4. 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 counsel  Tyliyah Mcmeekin L 03/24/2013, 12:02 AM

## 2013-03-24 NOTE — Progress Notes (Signed)
[facility] Progress Note  Subjective:   Feeling better. Emergent HD last evening/ signed off 42 min early secondary to vomiting.  Objective Filed Vitals:   03/24/13 0514 03/24/13 0530 03/24/13 0624 03/24/13 0856  BP: 165/71 173/83 164/98 165/97  Pulse: 76 78 77 81  Temp: 97.4 F (36.3 C)  98.4 F (36.9 C) 99.5 F (37.5 C)  TempSrc: Oral  Oral Oral  Resp: 20 22 20 20   Height:   5\' 2"  (1.575 m)   Weight: 56.7 kg (125 lb)     SpO2:   95% 99%   Physical Exam General: Chronically ill-appearing, NAD Heart:RRR Lungs: Diminished in all lung fields. Abdomen: Soft, NT, + BS Extremities: No LE edema Dialysis Access: R AVG + bruit  HD Orders: MWF/ [place] EDW 55kg. 4:00 F180. 2K/2.5Ca Bath. R AVG 450/A1.5 Heparin 3000 u. Epo 11K, Hectorol 10 mcg Recent Labs: Hgb 9.2, P 11.2, PTH 1071  Assessment/Plan: 1. Acute respiratory failure/ systolic CHF - Improved with emergent HD/partial session. 2. Hyperkalemia - Resolved with emergent HD. K + 4.5 3. Medical non-compliance - last outpatient HD was 1/15 and when she does come, she signs off. Discussed barriers to adherence and she states that she is wheelchair bound and needs a ramp. This however does not explain why she signs off on the days that she is able to come to dialysis.  Counseled risks, especially since she has children in the home. Will request SW assistance/ possible psych eval 4. ESRD - MWF, HD again tomorrow to get back on schedule and for vol xs 5. Anemia - Hgb 7.5 < 9.2 outpatient. Likely due to dilution and missing op Epo. Aranesp 200 ordered. Follow CBC, transfuse prn 6. Secondary hyperparathyroidism - Corr Ca 7.4. Phos poorly controlled op. Last PTH 1071. Continue Vit D and added Ca bath. Many compliance issues per #2 above.  7. HTN/volume - SBPs 160s on Norvasc 10. Cough and poor air movement. CXR could not exclude superimposed PNA. Will repeat now that some volume off. Net UF 4L last evening.  HD again tomorrow  for vol xs and BP control.Try for 2-3L  8. Nutrition - Renal diet, multivit, add ONS 9. HIV - home meds  Scot JunKaren E. Thad RangerWarren, PA-C [facility] Pager [phone] 03/24/2013,11:41 AM  LOS: 1 day    Additional Objective Labs: Basic Metabolic Panel:  Recent Labs Lab 03/23/13 2102 03/24/13 0218 03/24/13 0800  NA 142 141 139  K 6.4* 6.3* 4.5  CL 93* 94* 94*  CO2 17* 18* 27  GLUCOSE 95 87 96  BUN 121* 122* 48*  CREATININE 22.94* 23.23* 11.91*  CALCIUM 7.1* 6.8* 7.7*  PHOS  --  10.3*  --    Liver Function Tests:  Recent Labs Lab 03/23/13 2102 03/24/13 0218  AST 13  --   ALT 9  --   ALKPHOS 93  --   BILITOT 0.5  --   PROT 9.4*  --   ALBUMIN 3.7 3.3*   CBC:  Recent Labs Lab 03/23/13 2102 03/24/13 0800  WBC 4.6 3.0*  NEUTROABS 3.3  --   HGB 9.2* 7.5*  HCT 28.3* 23.1*  MCV 85.5 84.3  PLT 149* 132*    Cardiac Enzymes:  Recent Labs Lab 03/23/13 2103  TROPONINI <0.30   Studies/Results: Dg Chest Port 1 View  03/23/2013   CLINICAL DATA:  Shortness of breath.  EXAM: PORTABLE CHEST - 1 VIEW  COMPARISON:  11/17/2012.  FINDINGS: Severe cardiomegaly and bilateral pulmonary alveolar infiltrates  consistent with pulmonary edema noted. No pneumothorax. No acute osseous abnormality.  IMPRESSION: Severe cardiomegaly with dense bilateral pulmonary alveolar infiltrates consistent with congestive heart failure with pulmonary edema. Superimposed pneumonia cannot be excluded.   Electronically Signed   By: Maisie Fus  Register   On: 03/23/2013 22:03   Medications:   . abacavir  300 mg Oral BID  . amLODipine  10 mg Oral QHS  . calcium gluconate 1 GM IV  1 g Intravenous Once  . cinacalcet  30 mg Oral QHS  . citalopram  20 mg Oral Daily  . [START ON 03/25/2013] darbepoetin (ARANESP) injection - DIALYSIS  200 mcg Intravenous Q Mon-HD  . [START ON 03/25/2013] doxercalciferol  10 mcg Intravenous Q M,W,F-HD  . etravirine  200 mg Oral BID WC  . heparin  5,000 Units Subcutaneous  Q8H  . multivitamin  1 tablet Oral QHS  . raltegravir  400 mg Oral BID  . sevelamer carbonate  4.8 g Oral TID WC  . sodium chloride  3 mL Intravenous Q12H  . sodium polystyrene  30 g Oral Once  . [START ON 03/26/2013] tenofovir  300 mg Oral Q7 days   I have seen and examined this patient and agree with plan as outlined by Maud Deed, PA-C.  Stressed importance of compliance with HD to prevent hospitalization and/or sudden death.Terrial Rhodes A,MD 03/25/2013 11:15 AM

## 2013-03-24 NOTE — Progress Notes (Signed)
Pt had 2 episodes of vomiting during her HD Tx.. (Occurred when BP dropped below 130). Was taken out of UF and given a saline bolus and rapidly improved. She then began cramping in her legs and requested to sign off AMA. I attempted to encourage her to finish her Tx but she continued to insist that I stop it. Dr. Darrick Pennaeterding called and informed of this, Pt taken off AMA 42 min early.

## 2013-03-24 NOTE — Progress Notes (Addendum)
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  Assessment/Plan:  Acute on chronic systolic CHF/volume overload secondary to missed hemodialysis/acute respiratory failure - Nephrology was consulted and patient underwent partial dialysis on 1/25-signed off of HD early. Much improved. Patient states that she has been noncompliant over the last week secondary to home issues/spouse dealing with some kind of cancer. As per discussion with nephrology, noncompliance is a recurrent problem with different reasons given each time. Counseled extensively regarding compliance with therapies. She verbalized understanding. Nephrology plans next dialysis on 1/26. Echo 11/16/12: LVEF 45-50% with diffuse hypokinesis.  ESRD on MWF HD/hyperkalemia/metabolic acidosis - Management as above. Hyperkalemia and metabolic acidosis resolved post dialysis. Nephrology following.  Hypertension - Mildly uncontrolled. Continue volume removal across dialysis. Continue medications and monitor.  Anemia/pancytopenia - Secondary to chronic kidney disease and? Related to HIV/meds - Follow CBC dialysis tomorrow and transfuse if hemoglobin less than 7 g per DL.  HIV - continue HAART Tx     Code Status: Full Family Communication: None at bedside Disposition Plan: Home possibly 1/26 after dialysis   Consultants:  Nephrology  Procedures:  Hemodialysis  Antibiotics:  None   Subjective: Feels much better and states that her breathing is almost back to baseline. Denies cough, chest pain or fevers.  Objective: Filed Vitals:   03/24/13 0514 03/24/13 0530 03/24/13 0624 03/24/13 0856  BP: 165/71  173/83 164/98 165/97  Pulse: 76 78 77 81  Temp: 97.4 F (36.3 C)  98.4 F (36.9 C) 99.5 F (37.5 C)  TempSrc: Oral  Oral Oral  Resp: 20 22 20 20   Height:   5\' 2"  (1.575 m)   Weight: 56.7 kg (125 lb)     SpO2:   95% 99%    Intake/Output Summary (Last 24 hours) at 03/24/13 1135 Last data filed at 03/24/13 0959  Gross per 24 hour  Intake    580 ml  Output   4260 ml  Net  -3680 ml   Filed Weights   03/24/13 0100 03/24/13 0140 03/24/13 0514  Weight: 58 kg (127 lb 13.9 oz) 60.8 kg (134 lb 0.6 oz) 56.7 kg (125 lb)     Exam:  General exam: Pleasant moderately built and thinly nourished young female, looking chronically ill sitting up comfortably in bed without distress. Respiratory system: Occasional basal crackles but rest of lung fields clear to auscultation. No increased work of breathing. Cardiovascular system: S1 & S2 heard, RRR. No JVD, murmurs, gallops, clicks or pedal edema. Gastrointestinal system: Abdomen is nondistended, soft and nontender. Normal bowel sounds heard. Central nervous system: Alert and oriented. No focal neurological deficits. Extremities: Symmetric 5 x 5 power.   Data Reviewed: Basic Metabolic Panel:  Recent Labs Lab 03/23/13 2102 03/24/13 0218 03/24/13 0800  NA 142 141 139  K 6.4* 6.3* 4.5  CL 93* 94* 94*  CO2 17* 18* 27  GLUCOSE 95 87 96  BUN 121* 122* 48*  CREATININE 22.94* 23.23* 11.91*  CALCIUM 7.1* 6.8* 7.7*  PHOS  --  10.3*  --    Liver Function Tests:  Recent Labs Lab 03/23/13 2102 03/24/13 0218  AST 13  --   ALT 9  --   ALKPHOS 93  --  BILITOT 0.5  --   PROT 9.4*  --   ALBUMIN 3.7 3.3*   No results found for this basename: LIPASE, AMYLASE,  in the last 168 hours No results found for this basename: AMMONIA,  in the last 168 hours CBC:  Recent Labs Lab 03/23/13 2102 03/24/13 0800  WBC 4.6 3.0*  NEUTROABS 3.3  --   HGB 9.2* 7.5*  HCT 28.3* 23.1*  MCV 85.5 84.3  PLT 149* 132*   Cardiac Enzymes:  Recent  Labs Lab 03/23/13 2103  TROPONINI <0.30   BNP (last 3 results)  Recent Labs  11/15/12 1805 03/23/13 2103  PROBNP >70000.0* >70000.0*   CBG: No results found for this basename: GLUCAP,  in the last 168 hours  No results found for this or any previous visit (from the past 240 hour(s)).       Studies: Dg Chest Port 1 View  03/23/2013   CLINICAL DATA:  Shortness of breath.  EXAM: PORTABLE CHEST - 1 VIEW  COMPARISON:  11/17/2012.  FINDINGS: Severe cardiomegaly and bilateral pulmonary alveolar infiltrates consistent with pulmonary edema noted. No pneumothorax. No acute osseous abnormality.  IMPRESSION: Severe cardiomegaly with dense bilateral pulmonary alveolar infiltrates consistent with congestive heart failure with pulmonary edema. Superimposed pneumonia cannot be excluded.   Electronically Signed   By: Maisie Fushomas  Register   On: 03/23/2013 22:03        Scheduled Meds: . abacavir  300 mg Oral BID  . amLODipine  10 mg Oral QHS  . calcium gluconate 1 GM IV  1 g Intravenous Once  . cinacalcet  30 mg Oral QHS  . citalopram  20 mg Oral Daily  . [START ON 03/25/2013] darbepoetin (ARANESP) injection - DIALYSIS  200 mcg Intravenous Q Mon-HD  . [START ON 03/25/2013] doxercalciferol  10 mcg Intravenous Q M,W,F-HD  . etravirine  200 mg Oral BID WC  . heparin  5,000 Units Subcutaneous Q8H  . multivitamin  1 tablet Oral QHS  . raltegravir  400 mg Oral BID  . sevelamer carbonate  4.8 g Oral TID WC  . sodium chloride  3 mL Intravenous Q12H  . sodium polystyrene  30 g Oral Once  . [START ON 03/26/2013] tenofovir  300 mg Oral Q7 days   Continuous Infusions:   Principal Problem:   Dialysis patient, noncompliant Active Problems:   ESRD on hemodialysis   HIV disease   Uremia   Acute respiratory failure   Hyperkalemia    Time spent: 45 minutes    Melquan Ernsberger, MD, FACP, FHM. [facility] Pager [phone]  If 7PM-7AM, please contact night-coverage [email or web address] Password  [email or web address] 03/24/2013, 11:35 AM    LOS: 1 day

## 2013-03-25 DIAGNOSIS — B20 Human immunodeficiency virus [HIV] disease: Secondary | ICD-10-CM

## 2013-03-25 LAB — CBC
HCT: 22.8 % — ABNORMAL LOW (ref 36.0–46.0)
Hemoglobin: 7.2 g/dL — ABNORMAL LOW (ref 12.0–15.0)
MCH: 26.9 pg (ref 26.0–34.0)
MCHC: 31.6 g/dL (ref 30.0–36.0)
MCV: 85.1 fL (ref 78.0–100.0)
PLATELETS: 108 10*3/uL — AB (ref 150–400)
RBC: 2.68 MIL/uL — ABNORMAL LOW (ref 3.87–5.11)
RDW: 19.6 % — AB (ref 11.5–15.5)
WBC: 3 10*3/uL — AB (ref 4.0–10.5)

## 2013-03-25 LAB — RENAL FUNCTION PANEL
Albumin: 3.1 g/dL — ABNORMAL LOW (ref 3.5–5.2)
BUN: 55 mg/dL — AB (ref 6–23)
CALCIUM: 7.3 mg/dL — AB (ref 8.4–10.5)
CO2: 26 mEq/L (ref 19–32)
Chloride: 93 mEq/L — ABNORMAL LOW (ref 96–112)
Creatinine, Ser: 13.5 mg/dL — ABNORMAL HIGH (ref 0.50–1.10)
GFR calc Af Amer: 3 mL/min — ABNORMAL LOW (ref >90)
GFR, EST NON AFRICAN AMERICAN: 3 mL/min — AB (ref >90)
Glucose, Bld: 88 mg/dL (ref 70–99)
PHOSPHORUS: 7.1 mg/dL — AB (ref 2.3–4.6)
Potassium: 4.7 mEq/L (ref 3.7–5.3)
Sodium: 140 mEq/L (ref 137–147)

## 2013-03-25 MED ORDER — LIDOCAINE-PRILOCAINE 2.5-2.5 % EX CREA: 1.0000 "application " | TOPICAL_CREAM | CUTANEOUS | Status: DC | PRN

## 2013-03-25 MED ORDER — CALCIUM CARBONATE 1250 MG/5ML PO SUSP: 500.0000 mg | Freq: Four times a day (QID) | ORAL | Status: DC | PRN

## 2013-03-25 MED ORDER — CAMPHOR-MENTHOL 0.5-0.5 % EX LOTN
1.0000 "application " | TOPICAL_LOTION | Freq: Three times a day (TID) | CUTANEOUS | Status: DC | PRN
  Filled 2013-03-25: qty 222

## 2013-03-25 MED ORDER — DOCUSATE SODIUM 283 MG RE ENEM
1.0000 | ENEMA | RECTAL | Status: DC | PRN
Start: 2013-03-25 — End: 2013-03-26

## 2013-03-25 MED ORDER — HEPARIN SODIUM (PORCINE) 1000 UNIT/ML DIALYSIS: 3000.0000 [IU] | Freq: Once | INTRAMUSCULAR | Status: DC

## 2013-03-25 MED ORDER — DARBEPOETIN ALFA-POLYSORBATE 200 MCG/0.4ML IJ SOLN
INTRAMUSCULAR | Status: AC
  Administered 2013-03-25: 200 ug via INTRAVENOUS
  Filled 2013-03-25: qty 0.4

## 2013-03-25 MED ORDER — ALTEPLASE 2 MG IJ SOLR: 2.0000 mg | Freq: Once | INTRAMUSCULAR | Status: AC | PRN

## 2013-03-25 MED ORDER — ACETAMINOPHEN 325 MG PO TABS: 650.0000 mg | ORAL_TABLET | Freq: Four times a day (QID) | ORAL | Status: DC | PRN

## 2013-03-25 MED ORDER — ONDANSETRON HCL 4 MG/2ML IJ SOLN
INTRAMUSCULAR | Status: AC
  Administered 2013-03-25: 4 mg via INTRAVENOUS
  Filled 2013-03-25: qty 2

## 2013-03-25 MED ORDER — ONDANSETRON HCL 4 MG PO TABS: 4.0000 mg | ORAL_TABLET | Freq: Four times a day (QID) | ORAL | Status: DC | PRN

## 2013-03-25 MED ORDER — HEPARIN SODIUM (PORCINE) 1000 UNIT/ML DIALYSIS: 1000.0000 [IU] | INTRAMUSCULAR | Status: DC | PRN

## 2013-03-25 MED ORDER — ONDANSETRON HCL 4 MG/2ML IJ SOLN
4.0000 mg | Freq: Four times a day (QID) | INTRAMUSCULAR | Status: DC | PRN
Start: 2013-03-25 — End: 2013-03-26
  Administered 2013-03-25: 4 mg via INTRAVENOUS

## 2013-03-25 MED ORDER — PENTAFLUOROPROP-TETRAFLUOROETH EX AERO: 1.0000 "application " | INHALATION_SPRAY | CUTANEOUS | Status: DC | PRN

## 2013-03-25 MED ORDER — ZOLPIDEM TARTRATE 5 MG PO TABS: 5.0000 mg | ORAL_TABLET | Freq: Every evening | ORAL | Status: DC | PRN

## 2013-03-25 MED ORDER — NEPRO/CARBSTEADY PO LIQD: 237.0000 mL | Freq: Three times a day (TID) | ORAL | Status: DC | PRN

## 2013-03-25 MED ORDER — SORBITOL 70 % SOLN
30.0000 mL | Status: DC | PRN
Start: 2013-03-25 — End: 2013-03-26

## 2013-03-25 MED ORDER — SODIUM CHLORIDE 0.9 % IV SOLN: 100.0000 mL | INTRAVENOUS | Status: DC | PRN

## 2013-03-25 MED ORDER — NEPRO/CARBSTEADY PO LIQD: 237.0000 mL | ORAL | Status: DC | PRN

## 2013-03-25 MED ORDER — ACETAMINOPHEN 650 MG RE SUPP: 650.0000 mg | Freq: Four times a day (QID) | RECTAL | Status: DC | PRN

## 2013-03-25 MED ORDER — DOXERCALCIFEROL 4 MCG/2ML IV SOLN
INTRAVENOUS | Status: AC
  Administered 2013-03-25: 10 ug via INTRAVENOUS
  Filled 2013-03-25: qty 6

## 2013-03-25 MED ORDER — LIDOCAINE HCL (PF) 1 % IJ SOLN: 5.0000 mL | INTRAMUSCULAR | Status: DC | PRN

## 2013-03-25 MED ORDER — HYDROXYZINE HCL 25 MG PO TABS: 25.0000 mg | ORAL_TABLET | Freq: Three times a day (TID) | ORAL | Status: DC | PRN

## 2013-03-25 NOTE — Progress Notes (Signed)
Subjective:  Feeling much better, comfortable in bed, no complaints  Objective: Vital signs in last 24 hours: Temp:  [98.9 F (37.2 C)-99.9 F (37.7 C)] 99.6 F (37.6 C) (01/26 0422) Pulse Rate:  [81-88] 87 (01/26 0422) Resp:  [18-20] 20 (01/26 0422) BP: (151-165)/(79-97) 151/79 mmHg (01/26 0422) SpO2:  [92 %-99 %] 95 % (01/26 0422) Weight:  [56.728 kg (125 lb 1 oz)] 56.728 kg (125 lb 1 oz) (01/25 2102) Weight change: -1.272 kg (-2 lb 12.9 oz)  Intake/Output from previous day: 01/25 0701 - 01/26 0700 In: 1680 [P.O.:1680] Out: -    Lab Results:  Recent Labs  03/23/13 2102 03/24/13 0800  WBC 4.6 3.0*  HGB 9.2* 7.5*  HCT 28.3* 23.1*  PLT 149* 132*   BMET:  Recent Labs  03/23/13 2102 03/24/13 0218 03/24/13 0800  NA 142 141 139  K 6.4* 6.3* 4.5  CL 93* 94* 94*  CO2 17* 18* 27  GLUCOSE 95 87 96  BUN 121* 122* 48*  CREATININE 22.94* 23.23* 11.91*  CALCIUM 7.1* 6.8* 7.7*  ALBUMIN 3.7 3.3*  --    No results found for this basename: PTH,  in the last 72 hours Iron Studies: No results found for this basename: IRON, TIBC, TRANSFERRIN, FERRITIN,  in the last 72 hours  EXAM: General appearance:  Alert, in no apparent distress Resp:  CTA without rales, rhonchi, or wheezes Cardio:  RRR without murmur or rub GI:  + BS, soft and nontender Extremities:  No edema Access:  AVG @ RFA with + bruit  [facility] [facility]  [facility] 55 kg. 4:00 F180. 2K/2.5Ca Bath. R AVG 450/A1.5  Heparin 3000 u   Epo 11K, Hectorol 10 mcg  Recent Labs: Hgb 9.2, P 11.2, PTH 1071  Assessment/Plan: 1. Acute respiratory failure/ Systolic CHF - improved s/p emergent HD yesterday.  2. Hyperkalemia - K improved from 6.3 to 4.5 with HD. 3. Medical noncompliance - last previous HD on 1/15, frequently signs off early. 4. ESRD - HD on MWF @ [facility].  HD pending today on regular schedule. 5. HTN/Volume - BP 151/79 on Amlodipine 10 mg qhs; CXR post-HD showed resolved CHF, wt 56.7 kg s/p net UF 4.1 L. 6. Anemia -  Hgb 7.5 yesterday, possibly sec to missed Epogen & dilution; Aranesp 200 mcg today. 7. Sec HPT - Ca 7.7 (8.3 corrected), P 10.3; Hectorol 10 mcg, Sensipar 30 mg qd, Renvela powder 4.8 g with meals. 8. Nutrition - Alb 3.3; renal diet & vitamin. 9. HIV - antiviral meds.   LOS: 2 days   LYLES,CHARLES 03/25/2013,7:26 AM  I have seen and examined patient, discussed with PA and agree with assessment and plan as outlined above.  Vomiting today, hiccups, may be due to uremia. Was severely azotemic on admission, plan HD today and again tomorrow. Lower vol as tolerated Vinson Moselleob Bethany Slider MD pager [phone]    cell [phone] 03/25/2013, 10:56 AM

## 2013-03-25 NOTE — Procedures (Signed)
I was present at this dialysis session, have reviewed the session itself and made  appropriate changes  Vinson Moselleob Koven Belinsky MD (pgr) [phone]    ([phone]) [phone] 03/25/2013, 10:30 AM

## 2013-03-25 NOTE — Progress Notes (Signed)
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  Assessment/Plan:  Acute on chronic systolic CHF/volume overload secondary to missed hemodialysis/acute respiratory failure - Nephrology was consulted and patient underwent partial dialysis on 1/25-signed off of HD early. Much improved. Patient states that she has been noncompliant over the last week secondary to home issues/spouse dealing with some kind of cancer. As per discussion with nephrology, noncompliance is a recurrent problem with different reasons given each time. Counseled extensively regarding compliance with therapies. She verbalized understanding. Nephrology plans next dialysis on 1/26. Echo 11/16/12: LVEF 45-50% with diffuse hypokinesis. - Improved. Discussed with nephrology who recommended additional hemodialysis treatment on 1/27.  ESRD on MWF HD/hyperkalemia/metabolic acidosis - Management as above. Hyperkalemia and metabolic acidosis resolved post dialysis. Nephrology following. - Vomiting, hiccups today which may be secondary to uremia and nephrology plans additional dialysis on 1/27  Hypertension - Mildly uncontrolled. Continue volume removal across dialysis. Continue medications and monitor. Mildly uncontrolled.  Anemia/pancytopenia - Secondary to chronic kidney disease and? Related to HIV/meds - Follow CBC dialysis tomorrow and transfuse if hemoglobin less than 7 g per DL. - Stable. ? Transfuse PRBC at HD in AM- defer to Nephrology.  HIV - continue HAART Tx     Code Status: Full Family Communication: None at bedside Disposition Plan: Home when medically  stable.   Consultants:  Nephrology  Procedures:  Hemodialysis  Antibiotics:  None   Subjective: Seen at hemodialysis this morning when she denied complaints. Subsequently seen by nephrologist and complained of vomiting and hiccups.  Objective: Filed Vitals:   03/25/13 1130 03/25/13 1200 03/25/13 1207 03/25/13 1300  BP: 162/74 163/73 163/70 147/82  Pulse: 84 88 89 96  Temp:   98.2 F (36.8 C) 98.1 F (36.7 C)  TempSrc:   Oral Oral  Resp:    18  Height:      Weight:   53.1 kg (117 lb 1 oz)   SpO2:   100% 98%    Intake/Output Summary (Last 24 hours) at 03/25/13 1507 Last data filed at 03/25/13 1207  Gross per 24 hour  Intake    960 ml  Output   2489 ml  Net  -1529 ml   Filed Weights   03/24/13 2102 03/25/13 0756 03/25/13 1207  Weight: 56.728 kg (125 lb 1 oz) 55.6 kg (122 lb 9.2 oz) 53.1 kg (117 lb 1 oz)     Exam:  General exam: Pleasant moderately built and thinly nourished young female, looking chronically ill, seen comfortably lying in bed undergoing dialysis.  Respiratory system: Occasional basal crackles but rest of lung fields clear to auscultation. No increased work of breathing. Cardiovascular system: S1 & S2 heard, RRR. No JVD, murmurs, gallops, clicks or pedal edema. Gastrointestinal system: Abdomen is nondistended, soft and nontender. Normal bowel sounds heard. Central nervous system: Alert and oriented. No focal neurological deficits. Extremities: Symmetric 5 x 5 power.   Data Reviewed: Basic Metabolic Panel:  Recent Labs Lab 03/23/13 2102 03/24/13 0218 03/24/13 0800 03/25/13 0800  NA 142 141 139 140  K 6.4* 6.3* 4.5 4.7  CL 93* 94* 94* 93*  CO2 17* 18* 27 26  GLUCOSE 95 87 96 88  BUN 121* 122* 48* 55*  CREATININE 22.94* 23.23* 11.91* 13.50*  CALCIUM 7.1* 6.8* 7.7* 7.3*  PHOS  --  10.3*  --  7.1*   Liver Function Tests:  Recent Labs Lab 03/23/13 2102 03/24/13 0218 03/25/13 0800  AST 13  --   --   ALT 9  --   --   ALKPHOS 93   --   --   BILITOT 0.5  --   --   PROT 9.4*  --   --   ALBUMIN 3.7 3.3* 3.1*   No results found for this basename: LIPASE, AMYLASE,  in the last 168 hours No results found for this basename: AMMONIA,  in the last 168 hours CBC:  Recent Labs Lab 03/23/13 2102 03/24/13 0800 03/25/13 0759  WBC 4.6 3.0* 3.0*  NEUTROABS 3.3  --   --   HGB 9.2* 7.5* 7.2*  HCT 28.3* 23.1* 22.8*  MCV 85.5 84.3 85.1  PLT 149* 132* 108*   Cardiac Enzymes:  Recent Labs Lab 03/23/13 2103  TROPONINI <0.30   BNP (last 3 results)  Recent Labs  11/15/12 1805 03/23/13 2103  PROBNP >70000.0* >70000.0*   CBG: No results found for this basename: GLUCAP,  in the last 168 hours  No results found for this or any previous visit (from the past 240 hour(s)).       Studies: Dg Chest Port 1 View  03/24/2013   CLINICAL DATA:  Cough, diminished breath sounds  EXAM: PORTABLE CHEST - 1 VIEW  COMPARISON:  03/24/2011  FINDINGS: Significant improvement in the diffuse airspace process compared to yesterday, compatible with resolving CHF. Heart is enlarged. No effusion or pneumothorax. Trachea is midline.  IMPRESSION: Resolved CHF.  Cardiomegaly.   Electronically Signed   By: Ruel Favorsrevor  Shick M.D.   On: 03/24/2013 19:30   Dg Chest Port 1 View  03/23/2013   CLINICAL DATA:  Shortness of breath.  EXAM: PORTABLE CHEST - 1 VIEW  COMPARISON:  11/17/2012.  FINDINGS: Severe cardiomegaly and bilateral pulmonary alveolar infiltrates consistent with pulmonary edema noted. No pneumothorax. No acute osseous abnormality.  IMPRESSION: Severe cardiomegaly with dense bilateral pulmonary alveolar infiltrates consistent with congestive heart failure with pulmonary edema. Superimposed pneumonia cannot be excluded.   Electronically Signed   By: Maisie Fushomas  Register   On: 03/23/2013 22:03        Scheduled Meds: . abacavir  300 mg Oral BID  . amLODipine  10 mg Oral QHS  . calcium gluconate 1 GM IV  1 g Intravenous Once  . cinacalcet  30 mg  Oral QHS  . citalopram  20 mg Oral Daily  . darbepoetin (ARANESP) injection - DIALYSIS  200 mcg Intravenous Q Mon-HD  . doxercalciferol  10 mcg Intravenous Q M,W,F-HD  . etravirine  200 mg Oral BID WC  . feeding supplement (NEPRO CARB STEADY)  237 mL Oral BID BM  . heparin  5,000 Units Subcutaneous Q8H  . multivitamin  1 tablet Oral QHS  . raltegravir  400 mg Oral BID  . sevelamer carbonate  4.8 g Oral TID WC  . sodium chloride  3 mL Intravenous Q12H  . sodium polystyrene  30 g Oral Once  . [START ON 03/26/2013] tenofovir  300 mg Oral Q7 days   Continuous Infusions:   Principal Problem:   Systolic CHF, acute on chronic Active Problems:   ESRD on hemodialysis   HIV disease   Uremia   Dialysis patient, noncompliant   Acute respiratory failure   Hyperkalemia    Time spent: 25  minutes    Kalub Morillo, MD, FACP, FHM. [facility] Pager [phone]  If 7PM-7AM, please contact night-coverage [email or web address] Password [email or web address] 03/25/2013, 3:07 PM    LOS: 2 days

## 2013-03-25 NOTE — Progress Notes (Signed)
Utilization review completed.  

## 2013-03-26 LAB — CBC
HCT: 23.8 % — ABNORMAL LOW (ref 36.0–46.0)
Hemoglobin: 7.4 g/dL — ABNORMAL LOW (ref 12.0–15.0)
MCH: 27.2 pg (ref 26.0–34.0)
MCHC: 31.1 g/dL (ref 30.0–36.0)
MCV: 87.5 fL (ref 78.0–100.0)
PLATELETS: 105 10*3/uL — AB (ref 150–400)
RBC: 2.72 MIL/uL — ABNORMAL LOW (ref 3.87–5.11)
RDW: 19.8 % — AB (ref 11.5–15.5)
WBC: 3.5 10*3/uL — ABNORMAL LOW (ref 4.0–10.5)

## 2013-03-26 MED ORDER — PENTAFLUOROPROP-TETRAFLUOROETH EX AERO
INHALATION_SPRAY | CUTANEOUS | Status: AC
  Filled 2013-03-26: qty 103.5

## 2013-03-26 NOTE — Discharge Summary (Signed)
Physician Discharge Summary  Bethany [name] ZOX:[id number] DOB: [birth year] DOA: 03/23/2013  PCP: Bethany Lewandowsky, MD  Admit date: 03/23/2013 Discharge date: 03/26/2013  Time spent: Greater than 30 minutes  Recommendations for Outpatient Follow-up:  1. Bethany [name], PCP in 1 week 2. Hemodialysis Center: Keep up regular hemodialysis appointments on Mondays, Wednesdays and Fridays   Discharge Diagnoses:  Principal Problem:   Systolic CHF, acute on chronic Active Problems:   ESRD on hemodialysis   HIV disease   Uremia   Dialysis patient, noncompliant   Acute respiratory failure   Hyperkalemia   Discharge Condition: Improved & Stable  Diet recommendation: Heart healthy diet  Filed Weights   03/25/13 2037 03/26/13 0723 03/26/13 1124  Weight: 53.071 kg (117 lb) 53.3 kg (117 lb 8.1 oz) 51.3 kg (113 lb 1.5 oz)    History of present illness:  53 year old female patient with history of 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  Hospital Course:   Acute on chronic systolic CHF/volume overload secondary to missed hemodialysis/acute respiratory failure  - Nephrology was consulted and patient underwent partial dialysis on 1/25-signed off of HD early. Patient states that she has been noncompliant over the last week secondary to home issues/spouse dealing with some kind of cancer. As per discussion with nephrology, noncompliance is a recurrent problem with different reasons given each time. Counseled extensively regarding compliance with therapies. She verbalized understanding. Due to symptomatic uremia/vomiting, patient underwent another cycle of hemodialysis on 1/27. Patient has significantly improved without any further nausea, vomiting or dyspnea. Nephrology has cleared her for discharge home.   ESRD on MWF  HD/hyperkalemia/metabolic acidosis  - Management as above. Hyperkalemia and metabolic acidosis resolved post dialysis. Nephrology following.  - Completed additional hemodialysis on 1/27  Hypertension  - Mildly uncontrolled. Continue volume removal across dialysis. Continue medications and monitor. Mildly uncontrolled.   Anemia/pancytopenia  - Secondary to chronic kidney disease and? Related to HIV/meds  - Stable. Monitor CBCs closely across dialysis and transfuse if hemoglobin is less than 7 g per DL.  HIV  - continue HAART Tx   Consultations:  Nephrology  Procedures:  Hemodialysis    Discharge Exam:  Complaints:  Denies any complaints. No nausea, vomiting or dyspnea. No chest pain.  Filed Vitals:   03/26/13 1028 03/26/13 1058 03/26/13 1124 03/26/13 1311  BP: 150/86 149/80 152/81 128/60  Pulse: 80 81 80 76  Temp:   98.1 F (36.7 C) 98.1 F (36.7 C)  TempSrc:   Oral Oral  Resp:   16 18  Height:      Weight:   51.3 kg (113 lb 1.5 oz)   SpO2:   99% 100%    General exam: Pleasant moderately built and thinly nourished young female, looking chronically ill, seen comfortably lying in bed undergoing dialysis.  Respiratory system: clear to auscultation. No increased work of breathing.  Cardiovascular system: S1 & S2 heard, RRR. No JVD, murmurs, gallops, clicks or pedal edema.  Gastrointestinal system: Abdomen is nondistended, soft and nontender. Normal bowel sounds heard.  Central nervous system: Alert and oriented. No focal neurological deficits.  Extremities: Symmetric 5 x 5 power   Discharge Instructions      Discharge Orders   Future Appointments Provider Department Dept Phone   04/22/2013 3:30 PM [facility] Covering Provider [name] [phone]   06/10/2013 11:30 AM Olug[place]iga  Hyman HopesJegede, MD [facility] [phone]   Future Orders Complete By Expires   (HEART FAILURE PATIENTS) Call MD:  Anytime you have  any of the following symptoms: 1) 3 pound weight gain in 24 hours or 5 pounds in 1 week 2) shortness of breath, with or without a dry hacking cough 3) swelling in the hands, feet or stomach 4) if you have to sleep on extra pillows at night in order to breathe.  As directed    Call MD for:  difficulty breathing, headache or visual disturbances  As directed    Call MD for:  extreme fatigue  As directed    Call MD for:  persistant dizziness or light-headedness  As directed    Call MD for:  persistant nausea and vomiting  As directed    Call MD for:  severe uncontrolled pain  As directed    Call MD for:  temperature >100.4  As directed    Diet - low sodium heart healthy  As directed    Increase activity slowly  As directed        Medication List         abacavir 300 MG tablet  Commonly known as:  ZIAGEN  Take 1 tablet (300 mg total) by mouth 2 (two) times daily.     amLODipine 10 MG tablet  Commonly known as:  NORVASC  Take 10 mg by mouth daily.     benzonatate 100 MG capsule  Commonly known as:  TESSALON PERLES  Take 1 capsule (100 mg total) by mouth 3 (three) times daily as needed for cough.     cinacalcet 30 MG tablet  Commonly known as:  SENSIPAR  Take 30 mg by mouth at bedtime.     citalopram 20 MG tablet  Commonly known as:  CELEXA  Take 20 mg by mouth daily.     etravirine 100 MG tablet  Commonly known as:  INTELENCE  Take 2 tablets (200 mg total) by mouth 2 (two) times daily with a meal.     multivitamin Tabs tablet  Take 1 tablet by mouth daily.     raltegravir 400 MG tablet  Commonly known as:  ISENTRESS  Take 1 tablet (400 mg total) by mouth 2 (two) times daily.     sevelamer carbonate 2.4 G Pack  Commonly known as:  RENVELA  Take 2.4 g by mouth 3 (three) times daily with meals.     temazepam 15 MG capsule  Commonly known as:  RESTORIL  Take 15 mg by mouth at bedtime as needed for sleep.     tenofovir 300 MG tablet  Commonly known as:  VIREAD  Take 1  tablet (300 mg total) by mouth every 7 (seven) days.     tetrahydrozoline 0.05 % ophthalmic solution  Place 2 drops into both eyes daily as needed (for dry eyes).       Follow-up Information   Follow up with Bethany LewandowskyJEGEDE, OLUGBEMIGA, MD. Schedule an appointment as soon as possible for a visit in 1 week.   Specialty:  Internal Medicine   Contact information:   [address] [place] [address] [phone]       Follow up with Dialysis Center. (Keep up regular hemodialysis appointments on Mondays, Wednesdays and Fridays.)        The results of significant diagnostics from this hospitalization (including imaging, microbiology, ancillary and laboratory) are listed below for reference.    Significant Diagnostic Studies: Dg Chest  Port 1 View  03/24/2013   CLINICAL DATA:  Cough, diminished breath sounds  EXAM: PORTABLE CHEST - 1 VIEW  COMPARISON:  03/24/2011  FINDINGS: Significant improvement in the diffuse airspace process compared to yesterday, compatible with resolving CHF. Heart is enlarged. No effusion or pneumothorax. Trachea is midline.  IMPRESSION: Resolved CHF.  Cardiomegaly.   Electronically Signed   By: Ruel Favors M.D.   On: 03/24/2013 19:30   Dg Chest Port 1 View  03/23/2013   CLINICAL DATA:  Shortness of breath.  EXAM: PORTABLE CHEST - 1 VIEW  COMPARISON:  11/17/2012.  FINDINGS: Severe cardiomegaly and bilateral pulmonary alveolar infiltrates consistent with pulmonary edema noted. No pneumothorax. No acute osseous abnormality.  IMPRESSION: Severe cardiomegaly with dense bilateral pulmonary alveolar infiltrates consistent with congestive heart failure with pulmonary edema. Superimposed pneumonia cannot be excluded.   Electronically Signed   By: Maisie Fus  Register   On: 03/23/2013 22:03    Microbiology: No results found for this or any previous visit (from the past 240 hour(s)).   Labs: Basic Metabolic Panel:  Recent Labs Lab 03/23/13 2102 03/24/13 0218 03/24/13 0800  03/25/13 0800  NA 142 141 139 140  K 6.4* 6.3* 4.5 4.7  CL 93* 94* 94* 93*  CO2 17* 18* 27 26  GLUCOSE 95 87 96 88  BUN 121* 122* 48* 55*  CREATININE 22.94* 23.23* 11.91* 13.50*  CALCIUM 7.1* 6.8* 7.7* 7.3*  PHOS  --  10.3*  --  7.1*   Liver Function Tests:  Recent Labs Lab 03/23/13 2102 03/24/13 0218 03/25/13 0800  AST 13  --   --   ALT 9  --   --   ALKPHOS 93  --   --   BILITOT 0.5  --   --   PROT 9.4*  --   --   ALBUMIN 3.7 3.3* 3.1*   No results found for this basename: LIPASE, AMYLASE,  in the last 168 hours No results found for this basename: AMMONIA,  in the last 168 hours CBC:  Recent Labs Lab 03/23/13 2102 03/24/13 0800 03/25/13 0759 03/26/13 1125  WBC 4.6 3.0* 3.0* 3.5*  NEUTROABS 3.3  --   --   --   HGB 9.2* 7.5* 7.2* 7.4*  HCT 28.3* 23.1* 22.8* 23.8*  MCV 85.5 84.3 85.1 87.5  PLT 149* 132* 108* 105*   Cardiac Enzymes:  Recent Labs Lab 03/23/13 2103  TROPONINI <0.30   BNP: BNP (last 3 results)  Recent Labs  11/15/12 1805 03/23/13 2103  PROBNP >70000.0* >70000.0*   CBG: No results found for this basename: GLUCAP,  in the last 168 hours   Signed:  Marcellus Scott, MD, FACP, FHM. [facility] Pager [phone]  If 7PM-7AM, please contact night-coverage [email or web address] Password [facility] 03/26/2013, 6:44 PM

## 2013-03-26 NOTE — Progress Notes (Signed)
Subjective:  No current complaints, wants to know if she could have dialysis only twice a week.  Objective: Vital signs in last 24 hours: Temp:  [98.1 F (36.7 C)-99.6 F (37.6 C)] 99 F (37.2 C) (01/27 0432) Pulse Rate:  [83-98] 98 (01/27 0432) Resp:  [16-22] 20 (01/27 0432) BP: (127-199)/(65-101) 159/80 mmHg (01/27 0432) SpO2:  [95 %-100 %] 95 % (01/27 0432) Weight:  [53.071 kg (117 lb)-55.6 kg (122 lb 9.2 oz)] 53.071 kg (117 lb) (01/26 2037) Weight change: -1.128 kg (-2 lb 7.8 oz)  Intake/Output from previous day: 01/26 0701 - 01/27 0700 In: 480 [P.O.:480] Out: 2489  Intake/Output this shift: Total I/O In: 480 [P.O.:480] Out: -   Lab Results:  Recent Labs  03/24/13 0800 03/25/13 0759  WBC 3.0* 3.0*  HGB 7.5* 7.2*  HCT 23.1* 22.8*  PLT 132* 108*   BMET:  Recent Labs  03/24/13 0218 03/24/13 0800 03/25/13 0800  NA 141 139 140  K 6.3* 4.5 4.7  CL 94* 94* 93*  CO2 18* 27 26  GLUCOSE 87 96 88  BUN 122* 48* 55*  CREATININE 23.23* 11.91* 13.50*  CALCIUM 6.8* 7.7* 7.3*  ALBUMIN 3.3*  --  3.1*   No results found for this basename: PTH,  in the last 72 hours Iron Studies: No results found for this basename: IRON, TIBC, TRANSFERRIN, FERRITIN,  in the last 72 hours  EXAM:  General appearance: Alert, in no apparent distress  Resp: CTA without rales, rhonchi, or wheezes  Cardio: RRR without murmur or rub  GI: + BS, soft and nontender  Extremities: No edema  Access: AVG @ RFA with + bruit   HD Orders: MWF/ [facility]  EDW 55 kg. 4:00 F180. 2K/2.5Ca Bath. R AVG 450/A1.5 Heparin 3000 u  Epo 11K, Hectorol 10 mcg  Recent Labs: Hgb 9.2, P 11.2, PTH 1071  Assessment/Plan: 1. Acute respiratory failure/ Systolic CHF - improved s/p HD.  2. Hyperkalemia - K improved from 6.3 to 4.5 with HD.  3. Medical noncompliance - last HD before admission on 1/15, frequently signs off early.  4. ESRD - HD on MWF @ [facility], vomiting yesterday possibly sec to uremia. HD pending again today.   5. HTN/Volume - BP 159/80 on Amlodipine 10 mg qhs; wt 53.1 kg s/p net UF 2.5 L.  6. Anemia - Hgb down to 7.2 yesterday, possibly sec to missed Epogen; Aranesp 200 mcg yesterday.  7. Sec HPT - Ca 7.3 (8 corrected), P 7.1; Hectorol 10 mcg, Sensipar 30 mg qd, Renvela powder 4.8 g with meals.  8. Nutrition - Alb 3.3; renal diet & vitamin.  9. HIV - antiviral meds.     LOS: 3 days   LYLES,CHARLES 03/26/2013,6:51 AM  I have seen and examined patient, discussed with PA and agree with assessment and plan as outlined above.  Looks a lot better today, probably just resolving uremic milieux.  Nausea gone.  OK for d/c from renal standpoint after HD today. Resume OP HD at center tomorrow.  Vinson Moselleob Kameka Whan MD pager [phone]    cell [phone] 03/26/2013, 9:35 AM

## 2013-03-26 NOTE — Progress Notes (Addendum)
Patient discharged.  Patient educated on discharge instructions, follow-up appointment, and discharge medications.  Patient instructed to return to previous dialysis schedule MWF.  Patient verbalized understanding.  No IV access.  Telemetry removed.  Telemetry box 21 returned, CCMT notified.  Patient belongings gathered.  Patient escorted out via wheelchair with NT

## 2013-04-15 ENCOUNTER — Telehealth: Payer: Self-pay

## 2013-04-15 NOTE — Telephone Encounter (Signed)
[facility]  Previous positive and on on ART.  She goes to dialysis  on Monday, Wednesday and Friday and will need a Tuesday or Thursday appointment.  Appointment given. Patient is not able to come prior for labs .  Medical record release  Mailed today and her husband will return it to our office for faxing as soon as possible. I explained the importance of having the records prior to the scheduled visit.   Laurell Josephsammy K Mortimer Bair, RN

## 2013-04-22 ENCOUNTER — Inpatient Hospital Stay: Payer: Medicare Other

## 2013-05-14 ENCOUNTER — Ambulatory Visit: Payer: Medicare Other | Admitting: Internal Medicine

## 2013-05-14 ENCOUNTER — Telehealth: Payer: Self-pay | Admitting: *Deleted

## 2013-05-14 NOTE — Telephone Encounter (Signed)
Unable to reach pt at either phone number.

## 2013-06-10 ENCOUNTER — Ambulatory Visit: Payer: Medicare Other | Admitting: Internal Medicine

## 2013-06-12 ENCOUNTER — Ambulatory Visit: Payer: Medicare Other | Admitting: Internal Medicine

## 2013-06-17 ENCOUNTER — Encounter (HOSPITAL_COMMUNITY): Payer: Self-pay | Admitting: Emergency Medicine

## 2013-06-17 DIAGNOSIS — I120 Hypertensive chronic kidney disease with stage 5 chronic kidney disease or end stage renal disease: Secondary | ICD-10-CM | POA: Insufficient documentation

## 2013-06-17 DIAGNOSIS — Z79899 Other long term (current) drug therapy: Secondary | ICD-10-CM | POA: Insufficient documentation

## 2013-06-17 DIAGNOSIS — Z91040 Latex allergy status: Secondary | ICD-10-CM | POA: Insufficient documentation

## 2013-06-17 DIAGNOSIS — Z8661 Personal history of infections of the central nervous system: Secondary | ICD-10-CM | POA: Insufficient documentation

## 2013-06-17 DIAGNOSIS — Z21 Asymptomatic human immunodeficiency virus [HIV] infection status: Secondary | ICD-10-CM | POA: Insufficient documentation

## 2013-06-17 DIAGNOSIS — N186 End stage renal disease: Secondary | ICD-10-CM | POA: Insufficient documentation

## 2013-06-17 DIAGNOSIS — J159 Unspecified bacterial pneumonia: Secondary | ICD-10-CM | POA: Insufficient documentation

## 2013-06-17 DIAGNOSIS — Z992 Dependence on renal dialysis: Secondary | ICD-10-CM | POA: Insufficient documentation

## 2013-06-17 NOTE — ED Notes (Signed)
Pt states cough for one month and fevers since Saturday following dialysis.  Pt states the cough has caused her to have flank pain.  Pt is a Tuesday, Thursday, Saturday dialysis pt with access in right forearm.

## 2013-06-18 ENCOUNTER — Emergency Department (HOSPITAL_COMMUNITY): Payer: Medicare Other

## 2013-06-18 ENCOUNTER — Emergency Department (HOSPITAL_COMMUNITY)
Admission: EM | Admit: 2013-06-18 | Discharge: 2013-06-18 | Disposition: A | Discharge status: Home / Self Care | Payer: Medicare Other | Attending: Emergency Medicine | Admitting: Emergency Medicine

## 2013-06-18 DIAGNOSIS — J189 Pneumonia, unspecified organism: Secondary | ICD-10-CM

## 2013-06-18 MED ORDER — VANCOMYCIN HCL IN DEXTROSE 1-5 GM/200ML-% IV SOLN
1000.0000 mg | Freq: Once | INTRAVENOUS | Status: AC
  Administered 2013-06-18: 1000 mg via INTRAVENOUS
  Filled 2013-06-18: qty 200

## 2013-06-18 MED ORDER — TRAMADOL HCL 50 MG PO TABS: 50.0000 mg | ORAL_TABLET | Freq: Four times a day (QID) | ORAL | Status: DC | PRN

## 2013-06-18 MED ORDER — LEVOFLOXACIN 500 MG PO TABS: 500.0000 mg | ORAL_TABLET | Freq: Every day | ORAL | Status: DC

## 2013-06-18 MED ORDER — LEVOFLOXACIN 500 MG PO TABS
500.0000 mg | ORAL_TABLET | Freq: Once | ORAL | Status: AC
  Administered 2013-06-18: 500 mg via ORAL
  Filled 2013-06-18: qty 1

## 2013-06-18 MED ORDER — TRAMADOL HCL 50 MG PO TABS
50.0000 mg | ORAL_TABLET | Freq: Once | ORAL | Status: AC
Start: 2013-06-18 — End: 2013-06-18
  Administered 2013-06-18: 50 mg via ORAL
  Filled 2013-06-18: qty 1

## 2013-06-18 NOTE — ED Notes (Signed)
Patient transported to X-ray 

## 2013-06-18 NOTE — ED Provider Notes (Signed)
Medical screening examination/treatment/procedure(s) were performed by non-physician practitioner and as supervising physician I was immediately available for consultation/collaboration.   EKG Interpretation None        Lyah Millirons, MD 06/18/13 0658 

## 2013-06-18 NOTE — Discharge Instructions (Signed)
Take your citalopram every other day until your antibiotic pills are complete to help decrease risk of side effects.  Follow up as scheduled for dialysis to receive continued IV antibiotics for pneumonia.  See below for further instructions.

## 2013-06-18 NOTE — ED Provider Notes (Signed)
CSN: [id number]     Arrival date & time 06/17/13  2328 History   First MD Initiated Contact with Patient 06/18/13 0046     Chief Complaint  Patient presents with  . Cough  . Flank Pain     (Consider location/radiation/quality/duration/timing/severity/associated sxs/prior Treatment) HPI Pt is a 53yo female with hx of HIV and ESRD on dialysis Tuesday, Thursday, and Saturday c/o cough and congestion x1 month associated with 2 day hx of fever, nausea and vomiting.  Pt c/o chest wall tenderness, worse with coughing. Pain is constant, aching, 10/10.  Has not tried anything for pain. Reports 1 episode of emesis on Saturday.  Reports fever Tmax 103 on Saturday but improved to 99.3 yesterday and today.  Denies sick contacts.    Past Medical History  Diagnosis Date  . 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  [place] in [place], [place] and [place] dialysis in [place] in [place].  Was receiving HD TTS schedule with a [facility].  Her nephrologist was Dr Vinnie Langtonada.  [place], [place] and was not able to set up new HD unit prior to coming.  Last HD was Tuesday July 29th.  HD access is R FA AVF.  Gets heparin at the center, dry weight was 56kg, ran 3 hours.     [name]. HIV disease 09/29/2012    Diagnosed on 2000, on HIV meds.  On ART, viral levels undetectable per pt.     [name]. 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    . Hypertension 09/29/2012   Past Surgical History  Procedure Laterality Date  . Hernia repair     Family History  Problem Relation Age of Onset  . Diabetes Mother   . Hypertension Mother   . Hypertension Sister   . Diabetes Sister    History  Substance Use Topics  . Smoking status: Never Smoker   . Smokeless tobacco: Not on file  . Alcohol Use: No   OB History   Grav Para Term Preterm  Abortions TAB SAB Ect Mult Living                 Review of Systems  Constitutional: Positive for fever. Negative for chills.  Respiratory: Positive for cough and shortness of breath.   Cardiovascular: Positive for chest pain ( chest wall/side pain). Negative for palpitations and leg swelling.  Gastrointestinal: Positive for nausea and vomiting. Negative for abdominal pain and diarrhea.  All other systems reviewed and are negative.     Allergies  Benadryl and Latex  Home Medications   Prior to Admission medications   Medication Sig Start Date End Date Taking? Authorizing Provider  abacavir (ZIAGEN) 300 MG tablet Take 1 tablet (300 mg total) by mouth 2 (two) times daily. 03/12/13   Jeanann Lewandowskylugbemiga Jegede, MD  amLODipine (NORVASC) 10 MG tablet Take 10 mg by mouth daily.    Historical Provider, MD  benzonatate (TESSALON PERLES) 100 MG capsule Take 1 capsule (100 mg total) by mouth 3 (three) times daily as needed for cough. 11/17/12   Penny Piarlando Vega, MD  cinacalcet (SENSIPAR) 30 MG tablet Take 30 mg by mouth at bedtime.    Historical Provider, MD  citalopram (CELEXA) 20 MG tablet Take 20 mg by mouth daily.  Historical Provider, MD  etravirine (INTELENCE) 100 MG tablet Take 2 tablets (200 mg total) by mouth 2 (two) times daily with a meal. 03/12/13   Jeanann Lewandowskylugbemiga Jegede, MD  multivitamin (RENA-VIT) TABS tablet Take 1 tablet by mouth daily. 10/02/12   Hartley BarefootJay K. Allena KatzPatel, MD  raltegravir (ISENTRESS) 400 MG tablet Take 1 tablet (400 mg total) by mouth 2 (two) times daily. 03/12/13   Jeanann Lewandowskylugbemiga Jegede, MD  sevelamer carbonate (RENVELA) 2.4 G PACK Take 2.4 g by mouth 3 (three) times daily with meals. 10/02/12   Hartley BarefootJay K. Allena KatzPatel, MD  temazepam (RESTORIL) 15 MG capsule Take 15 mg by mouth at bedtime as needed for sleep.    Historical Provider, MD  tenofovir (VIREAD) 300 MG tablet Take 1 tablet (300 mg total) by mouth every 7 (seven) days. 03/12/13   Jeanann Lewandowskylugbemiga Jegede, MD  tetrahydrozoline 0.05 % ophthalmic solution Place  2 drops into both eyes daily as needed (for dry eyes).    Historical Provider, MD   BP 176/89  Pulse 73  Temp(Src) 99 F (37.2 C) (Oral)  Resp 18  SpO2 100% Physical Exam  Nursing note and vitals reviewed. Constitutional: She appears well-developed and well-nourished.  Appears mildly fatigued. Does not appear to feel well.  HENT:  Head: Normocephalic and atraumatic.  Eyes: Conjunctivae are normal. No scleral icterus.  Neck: Normal range of motion. Neck supple.  Cardiovascular: Normal rate, regular rhythm and normal heart sounds.   Pulmonary/Chest: Effort normal and breath sounds normal. No respiratory distress. She has no wheezes. She has no rales. She exhibits no tenderness.  No respiratory distress, able to speak in full sentences w/o difficulty. Lungs: CTAB  Abdominal: Soft. Bowel sounds are normal. She exhibits no distension and no mass. There is no tenderness. There is no rebound and no guarding.  Musculoskeletal: Normal range of motion.  Neurological: She is alert.  Skin: Skin is warm and dry.    ED Course  Procedures (including critical care time) Labs Review Labs Reviewed - No data to display  Imaging Review Dg Chest 2 View  06/18/2013   CLINICAL DATA:  Right upper chest pain, shortness of breath, cough and fever for 1 week.  EXAM: CHEST  2 VIEW  COMPARISON:  DG CHEST 1V PORT dated 03/24/2013  FINDINGS: Stable cardiac enlargement with normal pulmonary vascularity. Interval development of a small right pleural effusion with infiltration or atelectasis in the right middle lung. Changes suggest developing pneumonia. Left lung is clear. No pneumothorax.  IMPRESSION: Developing infiltration in the right middle lung with small right pleural effusion. Changes likely to represent pneumonia.   Electronically Signed   By: Burman NievesWilliam  Stevens M.D.   On: 06/18/2013 00:48     EKG Interpretation None      MDM   Final diagnoses:  HCAP (healthcare-associated pneumonia)   Pt  presenting to ED with hx of HIV and ESRD dialysis patient found to have pneumonia. Pt appears uncomfortable but non-toxic. Temp 99 in ED.  Do not believe pt needs to be admitted or further blood work needed at this time. Discussed pt with Dr. Fayrene FearingJames, will tx as HCAP as pt does go to dialysis. Will tx pt with vancomycin and Levaquin, first dose given in ED. Discussed pt with Dr. Darrick Pennaeterding who stated he will help schedule pt's continued tx with vancomycin during dialysis appointments.  Return precautions provided. Pt verbalized understanding and agreement with tx plan.     Junius FinnerErin O'Malley, PA-C 06/18/13 [phone]

## 2013-07-10 ENCOUNTER — Emergency Department (HOSPITAL_COMMUNITY): Payer: Medicare Other

## 2013-07-10 ENCOUNTER — Encounter (HOSPITAL_COMMUNITY): Payer: Self-pay | Admitting: Emergency Medicine

## 2013-07-10 ENCOUNTER — Inpatient Hospital Stay (HOSPITAL_COMMUNITY)
Admission: EM | Admit: 2013-07-10 | Discharge: 2013-07-12 | DRG: 640 | Disposition: A | Discharge status: Home / Self Care | Payer: Medicare Other | Attending: Internal Medicine | Admitting: Internal Medicine

## 2013-07-10 DIAGNOSIS — M949 Disorder of cartilage, unspecified: Secondary | ICD-10-CM

## 2013-07-10 DIAGNOSIS — Z91199 Patient's noncompliance with other medical treatment and regimen due to unspecified reason: Secondary | ICD-10-CM

## 2013-07-10 DIAGNOSIS — Z9115 Patient's noncompliance with renal dialysis: Secondary | ICD-10-CM

## 2013-07-10 DIAGNOSIS — B20 Human immunodeficiency virus [HIV] disease: Secondary | ICD-10-CM | POA: Diagnosis present

## 2013-07-10 DIAGNOSIS — D649 Anemia, unspecified: Secondary | ICD-10-CM | POA: Diagnosis present

## 2013-07-10 DIAGNOSIS — Z91158 Patient's noncompliance with renal dialysis for other reason: Secondary | ICD-10-CM

## 2013-07-10 DIAGNOSIS — N186 End stage renal disease: Secondary | ICD-10-CM

## 2013-07-10 DIAGNOSIS — E8779 Other fluid overload: Secondary | ICD-10-CM | POA: Diagnosis present

## 2013-07-10 DIAGNOSIS — J90 Pleural effusion, not elsewhere classified: Secondary | ICD-10-CM | POA: Diagnosis present

## 2013-07-10 DIAGNOSIS — Z8661 Personal history of infections of the central nervous system: Secondary | ICD-10-CM

## 2013-07-10 DIAGNOSIS — Z9119 Patient's noncompliance with other medical treatment and regimen: Secondary | ICD-10-CM

## 2013-07-10 DIAGNOSIS — Z79899 Other long term (current) drug therapy: Secondary | ICD-10-CM

## 2013-07-10 DIAGNOSIS — M899 Disorder of bone, unspecified: Secondary | ICD-10-CM | POA: Diagnosis present

## 2013-07-10 DIAGNOSIS — E875 Hyperkalemia: Principal | ICD-10-CM | POA: Diagnosis present

## 2013-07-10 DIAGNOSIS — I120 Hypertensive chronic kidney disease with stage 5 chronic kidney disease or end stage renal disease: Secondary | ICD-10-CM | POA: Diagnosis present

## 2013-07-10 DIAGNOSIS — Z992 Dependence on renal dialysis: Secondary | ICD-10-CM

## 2013-07-10 DIAGNOSIS — N2581 Secondary hyperparathyroidism of renal origin: Secondary | ICD-10-CM | POA: Diagnosis present

## 2013-07-10 DIAGNOSIS — F909 Attention-deficit hyperactivity disorder, unspecified type: Secondary | ICD-10-CM | POA: Diagnosis present

## 2013-07-10 LAB — COMPREHENSIVE METABOLIC PANEL
ALT: 6 U/L (ref 0–35)
AST: 23 U/L (ref 0–37)
Albumin: 4.1 g/dL (ref 3.5–5.2)
Alkaline Phosphatase: 108 U/L (ref 39–117)
BUN: 87 mg/dL — ABNORMAL HIGH (ref 6–23)
CALCIUM: 9.1 mg/dL (ref 8.4–10.5)
CO2: 23 mEq/L (ref 19–32)
Chloride: 91 mEq/L — ABNORMAL LOW (ref 96–112)
Creatinine, Ser: 17.89 mg/dL — ABNORMAL HIGH (ref 0.50–1.10)
GFR calc Af Amer: 2 mL/min — ABNORMAL LOW (ref >90)
GFR calc non Af Amer: 2 mL/min — ABNORMAL LOW (ref >90)
Glucose, Bld: 80 mg/dL (ref 70–99)
Potassium: 6.6 mEq/L (ref 3.7–5.3)
SODIUM: 137 meq/L (ref 137–147)
TOTAL PROTEIN: 9.4 g/dL — AB (ref 6.0–8.3)
Total Bilirubin: 0.6 mg/dL (ref 0.3–1.2)

## 2013-07-10 LAB — CBC WITH DIFFERENTIAL/PLATELET
Basophils Absolute: 0 10*3/uL (ref 0.0–0.1)
Basophils Relative: 0 % (ref 0–1)
EOS ABS: 0.3 10*3/uL (ref 0.0–0.7)
EOS PCT: 10 % — AB (ref 0–5)
HCT: 36.9 % (ref 36.0–46.0)
Hemoglobin: 11.7 g/dL — ABNORMAL LOW (ref 12.0–15.0)
Lymphocytes Relative: 21 % (ref 12–46)
Lymphs Abs: 0.6 10*3/uL — ABNORMAL LOW (ref 0.7–4.0)
MCH: 28.3 pg (ref 26.0–34.0)
MCHC: 31.7 g/dL (ref 30.0–36.0)
MCV: 89.1 fL (ref 78.0–100.0)
MONO ABS: 0.3 10*3/uL (ref 0.1–1.0)
Monocytes Relative: 9 % (ref 3–12)
Neutro Abs: 1.6 10*3/uL — ABNORMAL LOW (ref 1.7–7.7)
Neutrophils Relative %: 60 % (ref 43–77)
PLATELETS: 140 10*3/uL — AB (ref 150–400)
RBC: 4.14 MIL/uL (ref 3.87–5.11)
RDW: 19.5 % — ABNORMAL HIGH (ref 11.5–15.5)
WBC: 2.8 10*3/uL — ABNORMAL LOW (ref 4.0–10.5)

## 2013-07-10 LAB — I-STAT ARTERIAL BLOOD GAS, ED
Acid-Base Excess: 5 mmol/L — ABNORMAL HIGH (ref 0.0–2.0)
BICARBONATE: 29.2 meq/L — AB (ref 20.0–24.0)
O2 SAT: 94 %
PO2 ART: 67 mmHg — AB (ref 80.0–100.0)
Patient temperature: 98.6
TCO2: 30 mmol/L (ref 0–100)
pCO2 arterial: 39.6 mmHg (ref 35.0–45.0)
pH, Arterial: 7.475 — ABNORMAL HIGH (ref 7.350–7.450)

## 2013-07-10 MED ORDER — TENOFOVIR DISOPROXIL FUMARATE 300 MG PO TABS: 300.0000 mg | ORAL_TABLET | ORAL | Status: DC

## 2013-07-10 MED ORDER — ETRAVIRINE 100 MG PO TABS
200.0000 mg | ORAL_TABLET | Freq: Two times a day (BID) | ORAL | Status: DC
  Administered 2013-07-11 – 2013-07-12 (×4): 200 mg via ORAL
  Filled 2013-07-10 (×5): qty 2

## 2013-07-10 MED ORDER — SEVELAMER CARBONATE 2.4 G PO PACK
2.4000 g | PACK | Freq: Three times a day (TID) | ORAL | Status: DC
  Filled 2013-07-10 (×4): qty 1

## 2013-07-10 MED ORDER — INSULIN ASPART 100 UNIT/ML ~~LOC~~ SOLN
5.0000 [IU] | Freq: Once | SUBCUTANEOUS | Status: AC
  Administered 2013-07-11: 5 [IU] via INTRAVENOUS
  Filled 2013-07-10: qty 1

## 2013-07-10 MED ORDER — TEMAZEPAM 15 MG PO CAPS: 15.0000 mg | ORAL_CAPSULE | Freq: Every evening | ORAL | Status: DC | PRN

## 2013-07-10 MED ORDER — HEPARIN SODIUM (PORCINE) 5000 UNIT/ML IJ SOLN
5000.0000 [IU] | Freq: Three times a day (TID) | INTRAMUSCULAR | Status: DC
  Administered 2013-07-11 – 2013-07-12 (×4): 5000 [IU] via SUBCUTANEOUS
  Filled 2013-07-10 (×7): qty 1

## 2013-07-10 MED ORDER — ABACAVIR SULFATE 300 MG PO TABS
300.0000 mg | ORAL_TABLET | Freq: Two times a day (BID) | ORAL | Status: DC
  Administered 2013-07-11 – 2013-07-12 (×3): 300 mg via ORAL
  Filled 2013-07-10 (×5): qty 1

## 2013-07-10 MED ORDER — TRAMADOL HCL 50 MG PO TABS: 50.0000 mg | ORAL_TABLET | Freq: Four times a day (QID) | ORAL | Status: DC | PRN

## 2013-07-10 MED ORDER — CINACALCET HCL 30 MG PO TABS
30.0000 mg | ORAL_TABLET | Freq: Every day | ORAL | Status: DC
  Administered 2013-07-11: 30 mg via ORAL
  Filled 2013-07-10 (×3): qty 1

## 2013-07-10 MED ORDER — SODIUM CHLORIDE 0.9 % IJ SOLN
3.0000 mL | Freq: Two times a day (BID) | INTRAMUSCULAR | Status: DC
  Administered 2013-07-11 – 2013-07-12 (×2): 3 mL via INTRAVENOUS

## 2013-07-10 MED ORDER — RENA-VITE PO TABS
1.0000 | ORAL_TABLET | Freq: Every day | ORAL | Status: DC
  Administered 2013-07-11 – 2013-07-12 (×2): 1 via ORAL
  Filled 2013-07-10 (×2): qty 1

## 2013-07-10 MED ORDER — ONDANSETRON 4 MG PO TBDP
8.0000 mg | ORAL_TABLET | Freq: Once | ORAL | Status: AC
  Administered 2013-07-10: 8 mg via ORAL
  Filled 2013-07-10: qty 2

## 2013-07-10 MED ORDER — SODIUM BICARBONATE 8.4 % IV SOLN
50.0000 meq | Freq: Once | INTRAVENOUS | Status: AC
  Administered 2013-07-11: 50 meq via INTRAVENOUS
  Filled 2013-07-10: qty 50

## 2013-07-10 MED ORDER — BENZONATATE 100 MG PO CAPS
100.0000 mg | ORAL_CAPSULE | Freq: Three times a day (TID) | ORAL | Status: DC | PRN
  Filled 2013-07-10: qty 1

## 2013-07-10 MED ORDER — AMLODIPINE BESYLATE 10 MG PO TABS
10.0000 mg | ORAL_TABLET | Freq: Every day | ORAL | Status: DC
  Administered 2013-07-11 – 2013-07-12 (×2): 10 mg via ORAL
  Filled 2013-07-10 (×2): qty 1

## 2013-07-10 MED ORDER — DEXTROSE 50 % IV SOLN
1.0000 | Freq: Once | INTRAVENOUS | Status: AC
  Administered 2013-07-11: 50 mL via INTRAVENOUS
  Filled 2013-07-10: qty 50

## 2013-07-10 MED ORDER — CITALOPRAM HYDROBROMIDE 20 MG PO TABS
20.0000 mg | ORAL_TABLET | Freq: Every day | ORAL | Status: DC
  Administered 2013-07-11 – 2013-07-12 (×2): 20 mg via ORAL
  Filled 2013-07-10 (×2): qty 1

## 2013-07-10 MED ORDER — RALTEGRAVIR POTASSIUM 400 MG PO TABS
400.0000 mg | ORAL_TABLET | Freq: Two times a day (BID) | ORAL | Status: DC
  Administered 2013-07-11 – 2013-07-12 (×3): 400 mg via ORAL
  Filled 2013-07-10 (×5): qty 1

## 2013-07-10 NOTE — ED Notes (Signed)
Rt flank pain also temp

## 2013-07-10 NOTE — ED Notes (Signed)
The pt no longer makes urine

## 2013-07-10 NOTE — ED Notes (Signed)
Pt w/ n/v - zofran given 8mg  per protocol

## 2013-07-10 NOTE — Progress Notes (Signed)
No veins visualized for IV start with U/S on L arm. MD present. Ordered foot stick for IV start. Patient refused. MD aware.

## 2013-07-10 NOTE — ED Notes (Signed)
The pt has had a recent cold with cough.  She has had some vomiting and a temp for 3-4 days.  Recent dx of pneumonia and she has finished herantibiotics

## 2013-07-10 NOTE — ED Provider Notes (Signed)
CSN: [id number]     Arrival date & time 07/10/13  1912 History   First MD Initiated Contact with Patient 07/10/13 2208     Chief Complaint  Patient presents with  . multiple complaints      (Consider location/radiation/quality/duration/timing/severity/associated sxs/prior Treatment) Patient is a 53 y.o. female presenting with general illness. The history is provided by the patient.  Illness Location:  Right-sided chest wall and generalized Quality:  Pain weakness Severity:  Moderate Onset quality:  Gradual Duration:  1 week Timing:  Constant Progression:  Worsening Chronicity:  Recurrent Context:  Had been recently treated for HCAP Relieved by:  Nothing Worsened by:  Nothing Associated symptoms: chest pain (right lower) and shortness of breath   Associated symptoms: no abdominal pain, no cough, no fever, no loss of consciousness and no wheezing   Chest pain:    Quality:  Aching   Severity:  Severe   Onset quality:  Gradual   Timing:  Constant   Progression:  Unchanged Risk factors:  HIV   Past Medical History  Diagnosis Date  . 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  [place] in [place] 2006, [place] rec[place] dialysis in [place] starting in 2009.  Was receiving HD TTS schedule with a [facility].  Her nephrologist was Dr Vinnie Langtonada.  [place] [facility] [place], [place] and was not able to set up new HD unit prior to coming.  Last HD was Tuesday July 29th.  HD access is R FA AVF.  Gets heparin at the center, dry weight was 56kg, ran 3 hours.     [name]. HIV disease 09/29/2012    Diagnosed on 2000, on HIV meds.  On ART, viral levels undetectable per pt.     [name]. 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    . Hypertension 09/29/2012   Past Surgical History  Procedure Laterality Date  . Hernia repair     Family History   Problem Relation Age of Onset  . Diabetes Mother   . Hypertension Mother   . Hypertension Sister   . Diabetes Sister    History  Substance Use Topics  . Smoking status: Never Smoker   . Smokeless tobacco: Not on file  . Alcohol Use: No   OB History   Grav Para Term Preterm Abortions TAB SAB Ect Mult Living                 Review of Systems  Constitutional: Negative for fever.  Respiratory: Positive for shortness of breath. Negative for cough and wheezing.   Cardiovascular: Positive for chest pain (right lower).  Gastrointestinal: Negative for abdominal pain.  Neurological: Negative for loss of consciousness.  All other systems reviewed and are negative.     Allergies  Benadryl and Latex  Home Medications   Prior to Admission medications   Medication Sig Start Date End Date Taking? Authorizing Provider  abacavir (ZIAGEN) 300 MG tablet Take 1 tablet (300 mg total) by mouth 2 (two) times daily. 03/12/13  Yes Jeanann Lewandowskylugbemiga Jegede, MD  amLODipine (NORVASC) 10 MG tablet Take 10 mg by mouth daily.   Yes Historical Provider, MD  benzonatate (TESSALON PERLES) 100 MG capsule Take 1 capsule (100 mg total) by mouth 3 (three) times daily as needed for cough. 11/17/12  Yes Penny Pia, MD  cinacalcet (SENSIPAR) 30 MG tablet Take 30 mg by mouth at bedtime.   Yes Historical Provider, MD  citalopram (CELEXA) 20 MG tablet Take 20 mg by mouth daily.   Yes Historical Provider, MD  etravirine (INTELENCE) 100 MG tablet Take 2 tablets (200 mg total) by mouth 2 (two) times daily with a meal. 03/12/13  Yes [name], MD  multivitamin (RENA-VIT) TABS tablet Take 1 tablet by mouth daily. 10/02/12  Yes Jay K. Allena Katz, MD  raltegravir (ISENTRESS) 400 MG tablet Take 1 tablet (400 mg total) by mouth 2 (two) times daily. 03/12/13  Yes [name], MD  sevelamer carbonate (RENVELA) 2.4 G PACK Take 2.4 g by mouth 3 (three) times daily with meals. 10/02/12  Yes Jay K. Allena Katz, MD  temazepam (RESTORIL) 15  MG capsule Take 15 mg by mouth at bedtime as needed for sleep.   Yes Historical Provider, MD  tenofovir (VIREAD) 300 MG tablet Take 1 tablet (300 mg total) by mouth every 7 (seven) days. 03/12/13  Yes [name], MD  tetrahydrozoline 0.05 % ophthalmic solution Place 2 drops into both eyes daily as needed (for dry eyes).   Yes Historical Provider, MD  traMADol (ULTRAM) 50 MG tablet Take 1 tablet (50 mg total) by mouth every 6 (six) hours as needed. 06/18/13  Yes Rolland Porter, MD   BP 187/97  Pulse 68  Temp(Src) 98.7 F (37.1 C)  Resp 19  Ht 5\' 2"  (1.575 m)  Wt 119 lb (53.978 kg)  BMI 21.76 kg/m2  SpO2 97% Physical Exam  Constitutional: She is oriented to person, place, and time. She appears well-developed and well-nourished. No distress.  HENT:  Head: Normocephalic.  Eyes: Conjunctivae are normal.  Neck: Neck supple. No tracheal deviation present.  Cardiovascular: Normal rate and regular rhythm.   Pulmonary/Chest: Effort normal. No respiratory distress. She has decreased breath sounds in the right middle field and the right lower field. She has no wheezes. She has no rhonchi. She has no rales.  Abdominal: Soft. She exhibits no distension. There is no tenderness.  Neurological: She is alert and oriented to person, place, and time.  Skin: Skin is warm and dry.  Psychiatric: She has a normal mood and affect.    ED Course  Procedures (including critical care time) Labs Review Labs Reviewed  CBC WITH DIFFERENTIAL - Abnormal; Notable for the following:    WBC 2.8 (*)    Hemoglobin 11.7 (*)    RDW 19.5 (*)    Platelets 140 (*)    Eosinophils Relative 10 (*)    Neutro Abs 1.6 (*)    Lymphs Abs 0.6 (*)    All other components within normal limits  COMPREHENSIVE METABOLIC PANEL - Abnormal; Notable for the following:    Potassium 6.6 (*)    Chloride 91 (*)    BUN 87 (*)    Creatinine, Ser 17.89 (*)    Total Protein 9.4 (*)    GFR calc non Af Amer 2 (*)    GFR calc Af Amer 2 (*)     All other components within normal limits  BLOOD GAS, ARTERIAL    Imaging Review Dg Chest 2 View  07/10/2013   CLINICAL DATA:  Productive cough, fever  EXAM: CHEST  2 VIEW  COMPARISON:  06/18/2013  FINDINGS: Cardiomegaly again noted. There is small to moderate right pleural effusion with right lower lobe atelectasis or infiltrate. No pulmonary edema. Left lung is clear.  IMPRESSION: Right pleural  effusion with right lower lobe atelectasis or infiltrate. No pulmonary edema.   Electronically Signed   By: Natasha MeadLiviu  Pop M.D.   On: 07/10/2013 20:53     EKG Interpretation None      MDM   Final diagnoses:  Pleural effusion, right  Dialysis patient, noncompliant  Hyperkalemia    53 y.o. female presents with multiple complaints after missing for sessions of dialysis. She states that she has had some ongoing right-sided chest pain and discomfort that has kept her from going to her appointments. On arrival she is hyperkalemic at 6.6, she has no EKG changes including no peaking of T waves or prolongation of the QRS complexes to suggest a need for calcium at this time. Her x-ray is significant for worsening right-sided pleural effusion that is likely related to fluid overload given her noncompliance. She is sitting up in bed and speaking in full sentences at this time. Nephrology was consulted for emergent hemodialysis and they recommended admission to the hospitalist service and will follow her during her hospitalization. She was provided bicarbonate and glucose with insulin to start lowering her potassium level until able to dialyze in the morning. Admitted to the hospitalist service with no acute events in the emergency department.   Lyndal Pulleyaniel Audryana Hockenberry, MD 07/11/13 [phone]

## 2013-07-10 NOTE — H&P (Signed)
[facility] History and Physical  Bethany MustacheGloria Viglione ZOX:[id number] DOB: [birth year] DOA: 07/10/2013  Referring physician: EDP PCP: Jeanann LewandowskyJEGEDE, OLUGBEMIGA, MD   Chief Complaint: SOB   HPI: Bethany [name] is a [age] y.o. female with ESRD dialysis supposed to be MWF.  She presents to the ED with SOB.  She was recently diagnosed with HCAP and had been taking ABx at home (finished course).  Unfortunatly because she wasn't feeling well this past week, she missed at least 3 sessions of HD.  Today in the ED CXR shows a R pleural effusion.  While her pulmonary function is not that bad, her lab work comes back demonstrating potassium of 6.6 (among other expected abnormalities).  Review of Systems: Systems reviewed.  As above, otherwise negative  Past Medical History  Diagnosis Date  . 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  [place] in [place], [place] dialysis in [place] starting in 2009.  Was receiving HD TTS schedule with a [facility].  Her nephrologist was Dr Vinnie Langtonada.  [place] [facility] [place] [facility]Sep [place], [place]) and was not able to set up new HD unit prior to coming.  Last HD was Tuesday July 29th.  HD access is R FA AVF.  Gets heparin at the center, dry weight was 56kg, ran 3 hours.     [name]. HIV disease 09/29/2012    Diagnosed on 2000, on HIV meds.  On ART, viral levels undetectable per pt.     [name]. 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    . Hypertension 09/29/2012   Past Surgical History  Procedure Laterality Date  . Hernia repair     Social History:  reports that she has never smoked. She does not have any smokeless tobacco history on file. She reports that she does not drink alcohol or use illicit drugs.  Allergies  Allergen Reactions  . Benadryl [Diphenhydramine Hcl] Other (See Comments)    Makes patient jittery  .  Latex Hives and Itching    Family History  Problem Relation Age of Onset  . Diabetes Mother   . Hypertension Mother   . Hypertension Sister   . Diabetes Sister      Prior to Admission medications   Medication Sig Start Date End Date Taking? Authorizing Provider  abacavir (ZIAGEN) 300 MG tablet Take 1 tablet (300 mg total) by mouth 2 (two) times daily. 03/12/13  Yes Jeanann Lewandowskylugbemiga Jegede, MD  amLODipine (NORVASC) 10 MG tablet Take 10 mg by mouth daily.   Yes Historical Provider, MD  benzonatate (TESSALON PERLES) 100 MG capsule Take 1 capsule (100 mg total) by mouth 3 (three) times daily as needed for cough. 11/17/12  Yes Penny Piarlando Vega, MD  cinacalcet (SENSIPAR) 30 MG tablet Take 30 mg by mouth at bedtime.   Yes Historical Provider, MD  citalopram (CELEXA) 20 MG tablet Take 20 mg by mouth daily.   Yes Historical Provider, MD  etravirine (INTELENCE) 100 MG tablet Take 2 tablets (200 mg total) by mouth 2 (two) times daily with a meal. 03/12/13  Yes Jeanann Lewandowskylugbemiga Jegede, MD  multivitamin (RENA-VIT) TABS tablet Take 1 tablet by mouth daily. 10/02/12  Yes Jay K. Allena KatzPatel, MD  raltegravir (ISENTRESS) 400 MG tablet Take 1 tablet (400 mg total) by mouth 2 (two) times daily. 03/12/13  Yes  [name], MD  sevelamer carbonate (RENVELA) 2.4 G PACK Take 2.4 g by mouth 3 (three) times daily with meals. 10/02/12  Yes Jay K. Allena Katz, MD  temazepam (RESTORIL) 15 MG capsule Take 15 mg by mouth at bedtime as needed for sleep.   Yes Historical Provider, MD  tenofovir (VIREAD) 300 MG tablet Take 1 tablet (300 mg total) by mouth every 7 (seven) days. 03/12/13  Yes [name], MD  tetrahydrozoline 0.05 % ophthalmic solution Place 2 drops into both eyes daily as needed (for dry eyes).   Yes Historical Provider, MD  traMADol (ULTRAM) 50 MG tablet Take 1 tablet (50 mg total) by mouth every 6 (six) hours as needed. 06/18/13  Yes Rolland Porter, MD   Physical Exam: Filed Vitals:   07/10/13 2217  BP:   Pulse: 68  Temp:   Resp:  19    BP 187/97  Pulse 68  Temp(Src) 98.7 F (37.1 C)  Resp 19  Ht 5\' 2"  (1.575 m)  Wt 53.978 kg (119 lb)  BMI 21.76 kg/m2  SpO2 97%  General Appearance:    Alert, oriented, no distress, appears stated age  Head:    Normocephalic, atraumatic  Eyes:    PERRL, EOMI, sclera non-icteric        Nose:   Nares without drainage or epistaxis. Mucosa, turbinates normal  Throat:   Moist mucous membranes. Oropharynx without erythema or exudate.  Neck:   Supple. No carotid bruits.  No thyromegaly.  No lymphadenopathy.   Back:     No CVA tenderness, no spinal tenderness  Lungs:     Clear to auscultation bilaterally, without wheezes, rhonchi or rales  Chest wall:    No tenderness to palpitation  Heart:    Regular rate and rhythm without murmurs, gallops, rubs  Abdomen:     Soft, non-tender, nondistended, normal bowel sounds, no organomegaly  Genitalia:    deferred  Rectal:    deferred  Extremities:   No clubbing, cyanosis or edema.  Pulses:   2+ and symmetric all extremities  Skin:   Skin color, texture, turgor normal, no rashes or lesions  Lymph nodes:   Cervical, supraclavicular, and axillary nodes normal  Neurologic:   CNII-XII intact. Normal strength, sensation and reflexes      throughout    Labs on Admission:  Basic Metabolic Panel:  Recent Labs Lab 07/10/13 1930  NA 137  K 6.6*  CL 91*  CO2 23  GLUCOSE 80  BUN 87*  CREATININE 17.89*  CALCIUM 9.1   Liver Function Tests:  Recent Labs Lab 07/10/13 1930  AST 23  ALT 6  ALKPHOS 108  BILITOT 0.6  PROT 9.4*  ALBUMIN 4.1   No results found for this basename: LIPASE, AMYLASE,  in the last 168 hours No results found for this basename: AMMONIA,  in the last 168 hours CBC:  Recent Labs Lab 07/10/13 1930  WBC 2.8*  NEUTROABS 1.6*  HGB 11.7*  HCT 36.9  MCV 89.1  PLT 140*   Cardiac Enzymes: No results found for this basename: CKTOTAL, CKMB, CKMBINDEX, TROPONINI,  in the last 168 hours  BNP (last 3  results)  Recent Labs  11/15/12 1805 03/23/13 2103  PROBNP >70000.0* >70000.0*   CBG: No results found for this basename: GLUCAP,  in the last 168 hours  Radiological Exams on Admission: Dg Chest 2 View  07/10/2013   CLINICAL DATA:  Productive cough, fever  EXAM: CHEST  2 VIEW  COMPARISON:  06/18/2013  FINDINGS: Cardiomegaly again noted. There is small to moderate right pleural effusion with right lower lobe atelectasis or infiltrate. No pulmonary edema. Left lung is clear.  IMPRESSION: Right pleural effusion with right lower lobe atelectasis or infiltrate. No pulmonary edema.   Electronically Signed   By: Natasha MeadLiviu  Pop M.D.   On: 07/10/2013 20:53    EKG: Independently reviewed.  Assessment/Plan Principal Problem:   Hyperkalemia Active Problems:   ESRD on hemodialysis   HIV disease   Noncompliance of patient with renal dialysis   1. Hyperkalemia - due to missing at least 3 sessions of HD, temporary measures being used at this time, no EKG changes, nephrology consulted by EDP and they have elected to wait till morning for dialysis.  Admitting to tele, plan dialysis in the morning. 2. SOB - re-evaluate pulmonary status after dialysis, she is s/p treatment for HCAP and i am more suspicious that her residual pleural effusion may reflect fluid overload than ongoing need for ABx treatment at this time.  If she spikes fever or does not improve, then would start ABx. 3. HIV disease - continue home meds, of note patient also appears to have missed her establish care visit with ID.  Didn't bring this up as I was busy emphasizing the need for compliance with HD in our discussions (and consequences up to and including death associated with missing multiple sessions of HD).    Code Status: Full Code  Family Communication: No family in room Disposition Plan: Admit to inpatient   Time spent: 3170 min  Hillary BowJared M Samad Thon [facility] Pager [phone]  If 7AM-7PM, please contact the day team  taking care of the patient [email or web address] Password [email or web address] 07/10/2013, 11:53 PM

## 2013-07-10 NOTE — ED Notes (Signed)
Pt's acuity changed based off abnormal blood work. Pt denies any chest pain. AO x4.

## 2013-07-11 LAB — POTASSIUM
POTASSIUM: 5.8 meq/L — AB (ref 3.7–5.3)
POTASSIUM: 5.6 meq/L — AB (ref 3.7–5.3)
POTASSIUM: 4.7 meq/L (ref 3.7–5.3)
Potassium: 6.2 mEq/L — ABNORMAL HIGH (ref 3.7–5.3)
Potassium: 5.1 mEq/L (ref 3.7–5.3)
Potassium: 4.8 mEq/L (ref 3.7–5.3)

## 2013-07-11 LAB — GLUCOSE, CAPILLARY
GLUCOSE-CAPILLARY: 86 mg/dL (ref 70–99)
Glucose-Capillary: 106 mg/dL — ABNORMAL HIGH (ref 70–99)

## 2013-07-11 MED ORDER — DOXERCALCIFEROL 4 MCG/2ML IV SOLN: 13.0000 ug | INTRAVENOUS | Status: DC

## 2013-07-11 MED ORDER — SODIUM CHLORIDE 0.9 % IV SOLN: 100.0000 mL | INTRAVENOUS | Status: DC | PRN

## 2013-07-11 MED ORDER — HYDRALAZINE HCL 20 MG/ML IJ SOLN
5.0000 mg | INTRAMUSCULAR | Status: DC | PRN
Start: 2013-07-11 — End: 2013-07-12
  Administered 2013-07-11 – 2013-07-12 (×3): 5 mg via INTRAVENOUS
  Filled 2013-07-11 (×3): qty 1

## 2013-07-11 MED ORDER — PENTAFLUOROPROP-TETRAFLUOROETH EX AERO: 1.0000 "application " | INHALATION_SPRAY | CUTANEOUS | Status: DC | PRN

## 2013-07-11 MED ORDER — LANTHANUM CARBONATE 500 MG PO CHEW
1000.0000 mg | CHEWABLE_TABLET | Freq: Three times a day (TID) | ORAL | Status: DC
  Administered 2013-07-11 – 2013-07-12 (×4): 1000 mg via ORAL
  Filled 2013-07-11 (×7): qty 2

## 2013-07-11 MED ORDER — HEPARIN SODIUM (PORCINE) 1000 UNIT/ML DIALYSIS
3000.0000 [IU] | INTRAMUSCULAR | Status: DC | PRN
  Administered 2013-07-11: 3000 [IU] via INTRAVENOUS_CENTRAL
  Filled 2013-07-11: qty 3

## 2013-07-11 MED ORDER — LIDOCAINE HCL (PF) 1 % IJ SOLN: 5.0000 mL | INTRAMUSCULAR | Status: DC | PRN

## 2013-07-11 MED ORDER — DARBEPOETIN ALFA-POLYSORBATE 60 MCG/0.3ML IJ SOLN: 60.0000 ug | INTRAMUSCULAR | Status: DC

## 2013-07-11 MED ORDER — HEPARIN SODIUM (PORCINE) 1000 UNIT/ML DIALYSIS
1000.0000 [IU] | INTRAMUSCULAR | Status: DC | PRN
  Filled 2013-07-11: qty 1

## 2013-07-11 MED ORDER — DOXERCALCIFEROL 4 MCG/2ML IV SOLN
6.0000 ug | INTRAVENOUS | Status: DC
  Filled 2013-07-11: qty 4

## 2013-07-11 MED ORDER — NEPRO/CARBSTEADY PO LIQD
237.0000 mL | ORAL | Status: DC | PRN
  Filled 2013-07-11: qty 237

## 2013-07-11 MED ORDER — ALTEPLASE 2 MG IJ SOLR
2.0000 mg | Freq: Once | INTRAMUSCULAR | Status: DC | PRN
  Filled 2013-07-11: qty 2

## 2013-07-11 MED ORDER — LIDOCAINE-PRILOCAINE 2.5-2.5 % EX CREA
1.0000 | TOPICAL_CREAM | CUTANEOUS | Status: DC | PRN
Start: 2013-07-11 — End: 2013-07-11
  Filled 2013-07-11: qty 5

## 2013-07-11 NOTE — Progress Notes (Signed)
Pateint admitted by Dr. Julian ReilGardner- Please see H&P. In dialysis for missed espisodes and now hyperkalemia Patient states she missed due to PNA and not feeling well  Marlin CanaryJessica Erin Obando DO

## 2013-07-11 NOTE — ED Notes (Signed)
Pt reports recently being diagnosed w/ pneumonia x2 weeks ago and seen by PCP - pt admits to no improvement, productive cough w/ yellow sputum and generalized malaise and n/v. Pt is A&Ox4 in no acute distress

## 2013-07-11 NOTE — Progress Notes (Signed)
Utilization Review Completed.Bethany [name] Bethany Dowell5/14/2015  

## 2013-07-11 NOTE — Progress Notes (Signed)
Paged K,Kirby with [facility] to make aware of b/p being elevated. 191/73, and to make aware of K-6.2 at 0039 which is 10 min after insulin given to lower K. Will given 5mg  Hydralazine per order and K will be drawn at 4:30am. Will continue to reassess. Pt resting comfortably without distress.

## 2013-07-11 NOTE — Progress Notes (Signed)
Pt with episode of spontaneous vomiting while taking meds.  No accompanying or precipitating nausea.  Pt states "i just do that sometimes."  Pt assisted to clean up, change gown, and bed linens changed.  Pt to recliner with call bell and phone in reach.  Will con't plan of care.

## 2013-07-11 NOTE — Consult Note (Signed)
Indication for Consultation:  Management of ESRD/hemodialysis; anemia, hypertension/volume and secondary hyperparathyroidism  HPI: Bethany [name] is a 53 y.o. female who presented to the ED with SOB and R pleuritic pain, secondary to missed HD. She recieves HD MWF @ [place], last HD 5/5. She was recently treated for HCAP and wasn't feeling well so she missed her last 3 HD, though she has a chronic history of noncompliance.  K+ was 6.6 initially, on HD now.   Past Medical History  Diagnosis Date  . 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  Started HD in [place] in 2006, moved to [place] and rec'd dialysis in [place] starting in 2009.  Was receiving HD TTS schedule with a [facility].  Her nephrologist was Dr Vinnie Langton.  She moved to [facility] this week (Sep 29, 2012) and was not able to set up new HD unit prior to coming.  Last HD was Tuesday July 29th.  HD access is R FA AVF.  Gets heparin at the center, dry weight was 56kg, ran 3 hours.     [name] HIV disease 09/29/2012    Diagnosed on 2000, on HIV meds.  On ART, viral levels undetectable per pt.     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    . Hypertension 09/29/2012   Past Surgical History  Procedure Laterality Date  . Hernia repair     Family History  Problem Relation Age of Onset  . Diabetes Mother   . Hypertension Mother   . Hypertension Sister   . Diabetes Sister    Social History:  reports that she has never smoked. She does not have any smokeless tobacco history on file. She reports that she does not drink alcohol or use illicit drugs. Allergies  Allergen Reactions  . Benadryl [Diphenhydramine Hcl] Other (See Comments)    Makes patient jittery  . Latex Hives and Itching   Prior to Admission medications   Medication Sig Start Date End Date Taking? Authorizing Provider  abacavir (ZIAGEN) 300  MG tablet Take 1 tablet (300 mg total) by mouth 2 (two) times daily. 03/12/13  Yes [name], MD  amLODipine (NORVASC) 10 MG tablet Take 10 mg by mouth daily.   Yes Historical Provider, MD  benzonatate (TESSALON PERLES) 100 MG capsule Take 1 capsule (100 mg total) by mouth 3 (three) times daily as needed for cough. 11/17/12  Yes Penny Pia, MD  cinacalcet (SENSIPAR) 30 MG tablet Take 30 mg by mouth at bedtime.   Yes Historical Provider, MD  citalopram (CELEXA) 20 MG tablet Take 20 mg by mouth daily.   Yes Historical Provider, MD  etravirine (INTELENCE) 100 MG tablet Take 2 tablets (200 mg total) by mouth 2 (two) times daily with a meal. 03/12/13  Yes [name], MD  multivitamin (RENA-VIT) TABS tablet Take 1 tablet by mouth daily. 10/02/12  Yes Jay K. Allena Katz, MD  raltegravir (ISENTRESS) 400 MG tablet Take 1 tablet (400 mg total) by mouth 2 (two) times daily. 03/12/13  Yes [name], MD  sevelamer carbonate (RENVELA) 2.4 G PACK Take 2.4 g by mouth 3 (three) times daily with meals. 10/02/12  Yes Jay K. Allena Katz, MD  temazepam (RESTORIL) 15 MG capsule Take 15 mg by mouth at bedtime as needed for sleep.   Yes Historical Provider, MD  tenofovir Stevie Kern)  300 MG tablet Take 1 tablet (300 mg total) by mouth every 7 (seven) days. 03/12/13  Yes Jeanann Lewandowskylugbemiga Jegede, MD  tetrahydrozoline 0.05 % ophthalmic solution Place 2 drops into both eyes daily as needed (for dry eyes).   Yes Historical Provider, MD  traMADol (ULTRAM) 50 MG tablet Take 1 tablet (50 mg total) by mouth every 6 (six) hours as needed. 06/18/13  Yes Rolland PorterMark James, MD   Current Facility-Administered Medications  Medication Dose Route Frequency Provider Last Rate Last Dose  . 0.9 %  sodium chloride infusion  100 mL Intravenous PRN Ethelene HalJames W Lin      . 0.9 %  sodium chloride infusion  100 mL Intravenous PRN Ethelene HalJames W Lin      . abacavir (ZIAGEN) tablet 300 mg  300 mg Oral BID Hillary BowJared M Gardner, DO      . alteplase (CATHFLO ACTIVASE) injection 2  mg  2 mg Intracatheter Once PRN Ethelene HalJames W Lin      . amLODipine (NORVASC) tablet 10 mg  10 mg Oral Daily Hillary BowJared M Gardner, DO      . benzonatate (TESSALON) capsule 100 mg  100 mg Oral TID PRN Hillary BowJared M Gardner, DO      . cinacalcet [facility](SENSIPAR) tablet 30 mg  30 mg Oral QHS Hillary BowJared M Gardner, DO      . citalopram (CELEXA) tablet 20 mg  20 mg Oral Daily Jared M Gardner, DO      . etravirine (INTELENCE) tablet 200 mg  200 mg Oral BID WC Hillary BowJared M Gardner, DO      . feeding supplement (NEPRO CARB STEADY) liquid 237 mL  237 mL Oral PRN Ethelene HalJames W Lin      . heparin injection 1,000 Units  1,000 Units Dialysis PRN Ethelene HalJames W Lin      . heparin injection 3,000 Units  3,000 Units Dialysis PRN Ethelene HalJames W Lin   3,000 Units at 07/11/13 0720  . heparin injection 5,000 Units  5,000 Units Subcutaneous 3 times per day Hillary BowJared M Gardner, DO      . hydrALAZINE (APRESOLINE) injection 5 mg  5 mg Intravenous Q4H PRN Leda GauzeKaren J Kirby-Graham, NP   5 mg at 07/11/13 0207  . lidocaine (PF) (XYLOCAINE) 1 % injection 5 mL  5 mL Intradermal PRN Ethelene HalJames W Lin      . lidocaine-prilocaine (EMLA) cream 1 application  1 application Topical PRN Ethelene HalJames W Lin      . multivitamin (RENA-VIT) tablet 1 tablet  1 tablet Oral Daily Hillary BowJared M Gardner, DO      . pentafluoroprop-tetrafluoroeth (GEBAUERS) aerosol 1 application  1 application Topical PRN Ethelene HalJames W Lin      . raltegravir (ISENTRESS) tablet 400 mg  400 mg Oral BID Hillary BowJared M Gardner, DO      . sevelamer carbonate (RENVELA) powder PACK 2.4 g  2.4 g Oral TID WC Hillary BowJared M Gardner, DO      . sodium chloride 0.9 % injection 3 mL  3 mL Intravenous Q12H Hillary BowJared M Gardner, DO      . temazepam (RESTORIL) capsule 15 mg  15 mg Oral QHS PRN Hillary BowJared M Gardner, DO      . Melene Muller[START ON 07/17/2013] tenofovir (VIREAD) tablet 300 mg  300 mg Oral Q7 days Hillary BowJared M Gardner, DO      . traMADol Janean Sark(ULTRAM) tablet 50 mg  50 mg Oral Q6H PRN Hillary BowJared M Gardner, DO       Labs: Basic Metabolic Panel:  Recent Labs Lab 07/10/13 1930  07/11/13 0039  07/11/13 0550 07/11/13 0715  NA 137  --   --   --   K 6.6* 6.2* 5.8* 5.1  CL 91*  --   --   --   CO2 23  --   --   --   GLUCOSE 80  --   --   --   BUN 87*  --   --   --   CREATININE 17.89*  --   --   --   CALCIUM 9.1  --   --   --    Liver Function Tests:  Recent Labs Lab 07/10/13 1930  AST 23  ALT 6  ALKPHOS 108  BILITOT 0.6  PROT 9.4*  ALBUMIN 4.1   No results found for this basename: LIPASE, AMYLASE,  in the last 168 hours No results found for this basename: AMMONIA,  in the last 168 hours CBC:  Recent Labs Lab 07/10/13 1930  WBC 2.8*  NEUTROABS 1.6*  HGB 11.7*  HCT 36.9  MCV 89.1  PLT 140*   Cardiac Enzymes: No results found for this basename: CKTOTAL, CKMB, CKMBINDEX, TROPONINI,  in the last 168 hours CBG:  Recent Labs Lab 07/11/13 0124  GLUCAP 106*   Iron Studies: No results found for this basename: IRON, TIBC, TRANSFERRIN, FERRITIN,  in the last 72 hours Studies/Results: Dg Chest 2 View  07/10/2013   CLINICAL DATA:  Productive cough, fever  EXAM: CHEST  2 VIEW  COMPARISON:  06/18/2013  FINDINGS: Cardiomegaly again noted. There is small to moderate right pleural effusion with right lower lobe atelectasis or infiltrate. No pulmonary edema. Left lung is clear.  IMPRESSION: Right pleural effusion with right lower lobe atelectasis or infiltrate. No pulmonary edema.   Electronically Signed   By: Natasha MeadLiviu  Pop M.D.   On: 07/10/2013 20:53   Review of Systems: Gen: Denies any fever, chills, sweats, anorexia, fatigue, weakness HEENT: No visual complaints, No history of Retinopathy. Normal external appearance No Epistaxis or Sore throat.  CV: Denies chest pain, angina, palpitations, syncope, orthopnea, PND, peripheral edema, and claudication. Resp: Reports prodcutive cough. Denies dyspnea at rest, dyspnea with exercise,  wheezing, coughing up blood, and pleurisy. GI: Denies nausea, vomiting and abd pain GU : Denies urinary burning, blood in urine, urinary frequency,  urinary hesitancy, nocturnal urination, and urinary incontinence.  MS: Denies joint pain, limitation of movement, and swelling, stiffness, low back pain, extremity pain.  Derm: Denies rash, itching, dry skin, Psych: Denies depression, anxiety, memory loss, suicidal ideation, hallucinations, paranoia, and confusion. Heme: Denies bruising, bleeding, and enlarged lymph nodes. Neuro: No headache.  No weakness. Endocrine No DM.  No Thyroid disease.  No Adrenal disease.  Physical Exam: Filed Vitals:   07/11/13 0930 07/11/13 1000 07/11/13 1030 07/11/13 1050  BP: 189/91 163/85 164/96 147/83  Pulse: 68 69 69 68  Temp:      TempSrc:      Resp: 14 19 20 16   Height:      Weight:      SpO2:         General: Well developed, well nourished, in no acute distress. Head: Normocephalic, atraumatic, sclera non-icteric, mucus membranes are moist Neck: Supple. JVD not elevated. Lungs: Clear bilaterally to auscultation without wheezes, rales, or rhonchi. Breathing is unlabored. Heart: RRR with S1 S2. No murmurs, rubs, or gallops appreciated. Abdomen: Soft, non-tender, non-distended with normoactive bowel sounds. No rebound/guarding. No obvious abdominal masses. M-S:  Strength and tone appear normal for age. Lower extremities:without edema  or ischemic changes, no open wounds  Neuro: Alert and oriented X 3. Moves all extremities spontaneously. Psych:  Responds to questions appropriately with a normal affect. Dialysis Access: R AVG patent on HD  Dialysis Orders:  TTS @ [place] 50.5 kgs 4 hr  2K/2.5Ca+ 3000  R AVG  hectorol 13 mcg IV/HD Aranesp 60q Week No Venofer     Assessment/Plan: 1.  hyperkalemia- secondary to missed HD, improved to 5.1 w treatment. On HD now.  2. Sob- pleural effusion. No antibx. afebrile 3.  ESRD -  TTS @ [place]. Major issue with noncompliance. 4.  Hypertension/volume  - 146/82 home meds- norvasc and metop. Close to edw, no edema. Next HD saturday 5.  Anemia  - hgb 11.7  Watch cbc,  restart Aranesp saturday, no Fe due to high ferritin 6.  Metabolic bone disease -  Ca+ 9. Phos 5.5. Pth 198. Cont hectorol, sensipar and fosrenol.  7.  Nutrition - alb at goal. Mulitvit. Renal diet 8. HIV- per primary  Jetty Duhamel, NP [facility] [phone] 07/11/2013, 11:09 AM

## 2013-07-11 NOTE — Procedures (Signed)
I have seen and examined this patient and agree with the plan of care. Patient seen on dialysis no issues  AVF flows great  Garnetta BuddyMartin W Amiliana Foutz 07/11/2013, 8:50 AM

## 2013-07-11 NOTE — ED Notes (Signed)
Attempted IV x1, unsuccessful - IV team paged d/t pt w/ poor peripheral vascularization. Per IV team pt w/ no PIV access point per ultrasound, Dr. Julian ReilGardner at bedside approves for pt to have feet stick for IV however pt refused. Dr. Clydene PughKnott, ER resident made aware and is now currently at bedside of EJ attempt.

## 2013-07-12 DIAGNOSIS — Z9115 Patient's noncompliance with renal dialysis: Secondary | ICD-10-CM

## 2013-07-12 DIAGNOSIS — Z992 Dependence on renal dialysis: Secondary | ICD-10-CM

## 2013-07-12 DIAGNOSIS — N186 End stage renal disease: Secondary | ICD-10-CM

## 2013-07-12 DIAGNOSIS — E875 Hyperkalemia: Secondary | ICD-10-CM

## 2013-07-12 DIAGNOSIS — B20 Human immunodeficiency virus [HIV] disease: Secondary | ICD-10-CM

## 2013-07-12 LAB — POTASSIUM
POTASSIUM: 4.8 meq/L (ref 3.7–5.3)
POTASSIUM: 5.1 meq/L (ref 3.7–5.3)
POTASSIUM: 4.9 meq/L (ref 3.7–5.3)
Potassium: 5.1 mEq/L (ref 3.7–5.3)

## 2013-07-12 LAB — CBC
HCT: 31.1 % — ABNORMAL LOW (ref 36.0–46.0)
HEMOGLOBIN: 9.6 g/dL — AB (ref 12.0–15.0)
MCH: 27.5 pg (ref 26.0–34.0)
MCHC: 30.9 g/dL (ref 30.0–36.0)
MCV: 89.1 fL (ref 78.0–100.0)
PLATELETS: 160 10*3/uL (ref 150–400)
RBC: 3.49 MIL/uL — AB (ref 3.87–5.11)
RDW: 19.6 % — ABNORMAL HIGH (ref 11.5–15.5)
WBC: 2.4 10*3/uL — ABNORMAL LOW (ref 4.0–10.5)

## 2013-07-12 NOTE — Progress Notes (Signed)
[facility] ROUNDING NOTE   Subjective:   Interval History: no compalints this morning and hoping to leave  Objective:  Vital signs in last 24 hours:  Temp:  [98.3 F (36.8 C)-99.3 F (37.4 C)] 98.3 F (36.8 C) (05/15 0610) Pulse Rate:  [74-79] 78 (05/15 0610) Resp:  [16-18] 16 (05/15 0610) BP: (182-196)/(86-100) 196/100 mmHg (05/15 1029) SpO2:  [94 %-99 %] 94 % (05/15 0610) Weight:  [50.5 kg (111 lb 5.3 oz)] 50.5 kg (111 lb 5.3 oz) (05/15 0610)  Weight change: -2.678 kg (-5 lb 14.5 oz) Filed Weights   07/11/13 0651 07/11/13 1113 07/12/13 0610  Weight: 54.4 kg (119 lb 14.9 oz) 51.3 kg (113 lb 1.5 oz) 50.5 kg (111 lb 5.3 oz)    Intake/Output: I/O last 3 completed shifts: In: 340 [P.O.:340] Out: 3000 [Other:3000]   Intake/Output this shift:  Total I/O In: 120 [P.O.:120] Out: -   CVS- RRR RS- CTA ABD- BS present soft non-distended EXT- no edema   Basic Metabolic Panel:  Recent Labs Lab 07/10/13 1930  07/11/13 1836 07/11/13 2130 07/12/13 0209 07/12/13 0620 07/12/13 0830  NA 137  --   --   --   --   --   --   K 6.6*  < > 4.7 4.8 4.8 5.1 4.9  CL 91*  --   --   --   --   --   --   CO2 23  --   --   --   --   --   --   GLUCOSE 80  --   --   --   --   --   --   BUN 87*  --   --   --   --   --   --   CREATININE 17.89*  --   --   --   --   --   --   CALCIUM 9.1  --   --   --   --   --   --   < > = values in this interval not displayed.  Liver Function Tests:  Recent Labs Lab 07/10/13 1930  AST 23  ALT 6  ALKPHOS 108  BILITOT 0.6  PROT 9.4*  ALBUMIN 4.1   No results found for this basename: LIPASE, AMYLASE,  in the last 168 hours No results found for this basename: AMMONIA,  in the last 168 hours  CBC:  Recent Labs Lab 07/10/13 1930 07/12/13 0620  WBC 2.8* 2.4*  NEUTROABS 1.6*  --   HGB 11.7* 9.6*  HCT 36.9 31.1*  MCV 89.1 89.1  PLT 140* 160    Cardiac Enzymes: No results found for this basename: CKTOTAL, CKMB, CKMBINDEX,  TROPONINI,  in the last 168 hours  BNP: No components found with this basename: POCBNP,   CBG:  Recent Labs Lab 07/11/13 0124 07/11/13 1607  GLUCAP 106* 86    Microbiology: Results for orders placed during the hospital encounter of 11/15/12  MRSA PCR SCREENING     Status: None   Collection Time    11/16/12 12:55 AM      Result Value Ref Range Status   MRSA by PCR NEGATIVE  NEGATIVE Final   Comment:            The GeneXpert MRSA Assay (FDA     approved for NASAL specimens     only), is one component of a     comprehensive MRSA colonization  surveillance program. It is not     intended to diagnose MRSA     infection nor to guide or     monitor treatment for     MRSA infections.    Coagulation Studies: No results found for this basename: LABPROT, INR,  in the last 72 hours  Urinalysis: No results found for this basename: COLORURINE, APPERANCEUR, LABSPEC, PHURINE, GLUCOSEU, HGBUR, BILIRUBINUR, KETONESUR, PROTEINUR, UROBILINOGEN, NITRITE, LEUKOCYTESUR,  in the last 72 hours    Imaging: Dg Chest 2 View  07/10/2013   CLINICAL DATA:  Productive cough, fever  EXAM: CHEST  2 VIEW  COMPARISON:  06/18/2013  FINDINGS: Cardiomegaly again noted. There is small to moderate right pleural effusion with right lower lobe atelectasis or infiltrate. No pulmonary edema. Left lung is clear.  IMPRESSION: Right pleural effusion with right lower lobe atelectasis or infiltrate. No pulmonary edema.   Electronically Signed   By: Natasha MeadLiviu  Pop M.D.   On: 07/10/2013 20:53     Medications:     . abacavir  300 mg Oral BID  . amLODipine  10 mg Oral Daily  . cinacalcet  30 mg Oral QHS  . citalopram  20 mg Oral Daily  . [START ON 07/13/2013] darbepoetin (ARANESP) injection - DIALYSIS  60 mcg Intravenous Q Sat-HD  . doxercalciferol  6 mcg Intravenous Q T,Th,Sa-HD  . etravirine  200 mg Oral BID WC  . heparin  5,000 Units Subcutaneous 3 times per day  . lanthanum  1,000 mg Oral TID WC  .  multivitamin  1 tablet Oral Daily  . raltegravir  400 mg Oral BID  . sodium chloride  3 mL Intravenous Q12H  . [START ON 07/17/2013] tenofovir  300 mg Oral Q7 days   benzonatate, hydrALAZINE, temazepam, traMADol  Assessment/ Plan:   ESRD- [facility]  Non compliance  ANEMIA-controlled  MBD-Ca+ 9. Phos 5.5. Pth 198. Cont hectorol, sensipar and fosrenol       HTN/VOL- controlled  HIV per primary   LOS: 2 Garnetta BuddyMartin W Quinten Allerton @TODAY @1 :30 PM

## 2013-07-12 NOTE — Discharge Summary (Signed)
Physician Discharge Summary  Lolly MustacheGloria Adachi ZOX:[id number] DOB: Jul 09, 1960 DOA: 07/10/2013  PCP: Jeanann LewandowskyJEGEDE, OLUGBEMIGA, MD  Admit date: 07/10/2013 Discharge date: 07/12/2013  Time spent: 45 minutes  Recommendations for Outpatient Follow-up:  1. Hyperkalemia-resolved will need OP follow up in her PCP office and with nephrology most recent potassium was 5.1 and she is to have dialysis in the morning. 2. Hemodialysis-per nephrology will need to be compliant with her regular hemodialysis schedule. 3. HIV-currently stable will need to continue with her prior home medications 4. Low WBC-currently at baseline likely related to HIV will follow up with PCP  Discharge Diagnoses:  Principal Problem:   Hyperkalemia Active Problems:   ESRD on hemodialysis   HIV disease   Noncompliance of patient with renal dialysis   Discharge Condition: Improved  Diet recommendation: Renal Diet  Filed Weights   07/11/13 0651 07/11/13 1113 07/12/13 0610  Weight: 54.4 kg (119 lb 14.9 oz) 51.3 kg (113 lb 1.5 oz) 50.5 kg (111 lb 5.3 oz)    History of present illness:  53 yo female with ESRD on dialysis presented to the ED with increased shortness of breath,. She had apparently missed her regular dialysis session. Patient had a potassium of 6.6 on admission and required dialysis. She also had recently been treated for a HCAP and had completed on antibiotics for this. In addition she was noted to have a pleural effusion on her chest film.   Hospital Course:  Patient was admitted and a nephrology consultation was requested. After seeing her it was recommended that she undergo hemodialysis as an inpatient. After her dialysis her potassium normalized to 4.9 and a repeat was 5.1 Nephrology cleared the patient for discharge home. I spoke to the patient regarding importance of keeping her dialysis appointment for Saturday. She understands the importance of this and she states that she will go in the  morning.  Procedures:  Hemodialysis (i.e. Studies not automatically included, echos, thoracentesis, etc; not x-rays)  Consultations:  Nephrology  Discharge Exam: Filed Vitals:   07/12/13 1029  BP: 196/100  Pulse:   Temp:   Resp:     General: Awake and Alert and oriented Cardiovascular: RRR S1 S2 normal no gallop or rub Respiratory: Good aeration no ronchi noted no rales Abdomen: soft non-tender  Discharge Instructions You were cared for by a hospitalist during your hospital stay. If you have any questions about your discharge medications or the care you received while you were in the hospital after you are discharged, you can call the unit and asked to speak with the hospitalist on call if the hospitalist that took care of you is not available. Once you are discharged, your primary care physician will handle any further medical issues. Please note that NO REFILLS for any discharge medications will be authorized once you are discharged, as it is imperative that you return to your primary care physician (or establish a relationship with a primary care physician if you do not have one) for your aftercare needs so that they can reassess your need for medications and monitor your lab values.     Medication List         abacavir 300 MG tablet  Commonly known as:  ZIAGEN  Take 1 tablet (300 mg total) by mouth 2 (two) times daily.     amLODipine 10 MG tablet  Commonly known as:  NORVASC  Take 10 mg by mouth daily.     benzonatate 100 MG capsule  Commonly known as:  TESSALON PERLES  Take 1 capsule (100 mg total) by mouth 3 (three) times daily as needed for cough.     cinacalcet 30 MG tablet  Commonly known as:  SENSIPAR  Take 30 mg by mouth at bedtime.     citalopram 20 MG tablet  Commonly known as:  CELEXA  Take 20 mg by mouth daily.     etravirine 100 MG tablet  Commonly known as:  INTELENCE  Take 2 tablets (200 mg total) by mouth 2 (two) times daily with a meal.      multivitamin Tabs tablet  Take 1 tablet by mouth daily.     raltegravir 400 MG tablet  Commonly known as:  ISENTRESS  Take 1 tablet (400 mg total) by mouth 2 (two) times daily.     sevelamer carbonate 2.4 G Pack  Commonly known as:  RENVELA  Take 2.4 g by mouth 3 (three) times daily with meals.     temazepam 15 MG capsule  Commonly known as:  RESTORIL  Take 15 mg by mouth at bedtime as needed for sleep.     tenofovir 300 MG tablet  Commonly known as:  VIREAD  Take 1 tablet (300 mg total) by mouth every 7 (seven) days.     tetrahydrozoline 0.05 % ophthalmic solution  Place 2 drops into both eyes daily as needed (for dry eyes).     traMADol 50 MG tablet  Commonly known as:  ULTRAM  Take 1 tablet (50 mg total) by mouth every 6 (six) hours as needed.       Allergies  Allergen Reactions  . Benadryl [Diphenhydramine Hcl] Other (See Comments)    Makes patient jittery  . Latex Hives and Itching   Follow-up Information   Follow up with JEGEDE, OLUGBEMIGA, MD. Schedule an appointment as soon as possible for a visit in 1 week. (for lab work)    Specialty:  Internal Medicine   Contact information:   [address] [place] [address] [phone]        The results of significant diagnostics from this hospitalization (including imaging, microbiology, ancillary and laboratory) are listed below for reference.    Significant Diagnostic Studies: Dg Chest 2 View  07/10/2013   CLINICAL DATA:  Productive cough, fever  EXAM: CHEST  2 VIEW  COMPARISON:  06/18/2013  FINDINGS: Cardiomegaly again noted. There is small to moderate right pleural effusion with right lower lobe atelectasis or infiltrate. No pulmonary edema. Left lung is clear.  IMPRESSION: Right pleural effusion with right lower lobe atelectasis or infiltrate. No pulmonary edema.   Electronically Signed   By: Natasha Mead M.D.   On: 07/10/2013 20:53   Dg Chest 2 View  06/18/2013   CLINICAL DATA:  Right upper chest pain,  shortness of breath, cough and fever for 1 week.  EXAM: CHEST  2 VIEW  COMPARISON:  DG CHEST 1V PORT dated 03/24/2013  FINDINGS: Stable cardiac enlargement with normal pulmonary vascularity. Interval development of a small right pleural effusion with infiltration or atelectasis in the right middle lung. Changes suggest developing pneumonia. Left lung is clear. No pneumothorax.  IMPRESSION: Developing infiltration in the right middle lung with small right pleural effusion. Changes likely to represent pneumonia.   Electronically Signed   By: Burman Nieves M.D.   On: 06/18/2013 00:48    Microbiology: No results found for this or any previous visit (from the past 240 hour(s)).   Labs: Basic Metabolic Panel:  Recent Labs Lab 07/10/13 1930  07/11/13  2130 07/12/13 0209 07/12/13 0620 07/12/13 0830 07/12/13 1300  NA 137  --   --   --   --   --   --   K 6.6*  < > 4.8 4.8 5.1 4.9 5.1  CL 91*  --   --   --   --   --   --   CO2 23  --   --   --   --   --   --   GLUCOSE 80  --   --   --   --   --   --   BUN 87*  --   --   --   --   --   --   CREATININE 17.89*  --   --   --   --   --   --   CALCIUM 9.1  --   --   --   --   --   --   < > = values in this interval not displayed. Liver Function Tests:  Recent Labs Lab 07/10/13 1930  AST 23  ALT 6  ALKPHOS 108  BILITOT 0.6  PROT 9.4*  ALBUMIN 4.1   No results found for this basename: LIPASE, AMYLASE,  in the last 168 hours No results found for this basename: AMMONIA,  in the last 168 hours CBC:  Recent Labs Lab 07/10/13 1930 07/12/13 0620  WBC 2.8* 2.4*  NEUTROABS 1.6*  --   HGB 11.7* 9.6*  HCT 36.9 31.1*  MCV 89.1 89.1  PLT 140* 160   Cardiac Enzymes: No results found for this basename: CKTOTAL, CKMB, CKMBINDEX, TROPONINI,  in the last 168 hours BNP: BNP (last 3 results)  Recent Labs  11/15/12 1805 03/23/13 2103  PROBNP >70000.0* >70000.0*   CBG:  Recent Labs Lab 07/11/13 0124 07/11/13 1607  GLUCAP 106* 86        Signed:  Yevonne PaxSaadat A Taralyn Ferraiolo, MD  [facility] 07/12/2013, 2:19 PM

## 2013-07-12 NOTE — Consult Note (Signed)
I have seen and examined this patient and agree with the plan of care   Garnetta BuddyMartin W Rashee Marschall 07/12/2013, 1:29 PM

## 2013-07-12 NOTE — Discharge Instructions (Signed)
Make certain to go for your Hemodialysis on Saturday May16, 2015

## 2013-07-12 NOTE — Progress Notes (Signed)
Pt discharged home with family. Strongly reinforced to keep on dialysis schedule and not miss. She verb understanding of all discharge instructions.

## 2013-07-15 NOTE — ED Provider Notes (Signed)
I saw and evaluated the patient, reviewed the resident's note and I agree with the findings and plan.   .Face to face Exam:  General:  Awake HEENT:  Atraumatic Resp:  Normal effort Abd:  Nondistended Neuro:No focal weakness   Reviewed EKG with resident and agree with interpretation  Nelia Shiobert L Caia Lofaro, MD 07/15/13 1925

## 2013-07-26 ENCOUNTER — Telehealth: Payer: Self-pay

## 2013-07-26 NOTE — Telephone Encounter (Signed)
Patient will need assistance with making appointments.   I will make a Paramedic referral.  Laurell Josephs, RN

## 2013-08-12 ENCOUNTER — Encounter: Payer: Self-pay | Admitting: Internal Medicine

## 2013-08-12 ENCOUNTER — Ambulatory Visit (HOSPITAL_BASED_OUTPATIENT_CLINIC_OR_DEPARTMENT_OTHER): Payer: Medicare Other | Admitting: Internal Medicine

## 2013-08-12 VITALS — BP 198/100 | HR 80 | Temp 99.3°F | Resp 16 | Ht 62.0 in | Wt 111.8 lb

## 2013-08-12 DIAGNOSIS — R112 Nausea with vomiting, unspecified: Secondary | ICD-10-CM | POA: Insufficient documentation

## 2013-08-12 DIAGNOSIS — Z79899 Other long term (current) drug therapy: Secondary | ICD-10-CM | POA: Insufficient documentation

## 2013-08-12 DIAGNOSIS — I10 Essential (primary) hypertension: Secondary | ICD-10-CM

## 2013-08-12 DIAGNOSIS — N186 End stage renal disease: Secondary | ICD-10-CM

## 2013-08-12 DIAGNOSIS — R531 Weakness: Secondary | ICD-10-CM

## 2013-08-12 DIAGNOSIS — B20 Human immunodeficiency virus [HIV] disease: Secondary | ICD-10-CM

## 2013-08-12 DIAGNOSIS — I69998 Other sequelae following unspecified cerebrovascular disease: Secondary | ICD-10-CM

## 2013-08-12 DIAGNOSIS — I5023 Acute on chronic systolic (congestive) heart failure: Secondary | ICD-10-CM | POA: Diagnosis not present

## 2013-08-12 DIAGNOSIS — R29898 Other symptoms and signs involving the musculoskeletal system: Secondary | ICD-10-CM | POA: Insufficient documentation

## 2013-08-12 DIAGNOSIS — M6281 Muscle weakness (generalized): Secondary | ICD-10-CM

## 2013-08-12 DIAGNOSIS — Z992 Dependence on renal dialysis: Secondary | ICD-10-CM | POA: Insufficient documentation

## 2013-08-12 MED ORDER — ONDANSETRON HCL 4 MG PO TABS: 4.0000 mg | ORAL_TABLET | Freq: Three times a day (TID) | ORAL | Status: AC | PRN

## 2013-08-12 NOTE — Progress Notes (Signed)
Patient here for ED f/u on pneumonia.  Patient states she has some shortness of breath (intermittent). Patient had a stroke 5 years ago, would like to have her right leg checked. Patient states when she walks the right leg drags behind.  Patient c/o nausea and vomiting, denies diarrhea, constipation.  [facility]  Patient requesting Dental Referral.

## 2013-08-12 NOTE — Progress Notes (Signed)
Patient ID: Bethany [name], female   DOB: February 10, 1961, 53 y.o.   MRN: [id number]  CC:  Ed f/u  HPI:  Patient presents to clinic today as a hospital follow up.  Patient was seen in the ER 3 weeks ago for pneumonia. Patient states that she has been suffering from nauseous and vomiting for a total of one month with a 10 pound weight loss in that time frame.  She admits to decrease appetite for one month. Patient reports that she is compliant with medication regimen with her last dose of BP medication taken this morning.  Patient denies fevers, diarrhea, and chills.  She had her last round of dialysis on Saturday which helped with her nausea.   Patient reports that she had a stroke 5 years ago, and has had difficulty with right leg mobility as a result of her stroke.  She would like a referral to physical therapy to help regain strength in RLE.  Allergies  Allergen Reactions  . Benadryl [Diphenhydramine Hcl] Other (See Comments)    Makes patient jittery  . Latex Hives and Itching   Past Medical History  Diagnosis Date  . 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  [place] in [place] 2006, [place] in [place].  Was receiving HD TTS schedule with a [facility].  Her nephrologist was Dr Vinnie Langtonada.  [place] and was not able to set up new HD unit prior to coming.  Last HD was Tuesday July 29th.  HD access is [facility].  Gets heparin at the center, dry weight was 56kg, ran 3 hours.     [name]. HIV disease 09/29/2012    Diagnosed on 2000, on HIV meds.  On ART, viral levels undetectable per pt.     [name]. 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    . Hypertension 09/29/2012   Current Outpatient Prescriptions on File Prior to Visit  Medication Sig Dispense Refill  . abacavir (ZIAGEN) 300 MG tablet  Take 1 tablet (300 mg total) by mouth 2 (two) times daily.  60 tablet  2  . amLODipine (NORVASC) 10 MG tablet Take 10 mg by mouth daily.      . cinacalcet (SENSIPAR) 30 MG tablet Take 30 mg by mouth at bedtime.      [name]. etravirine (INTELENCE) 100 MG tablet Take 2 tablets (200 mg total) by mouth 2 (two) times daily with a meal.  60 tablet  2  . multivitamin (RENA-VIT) TABS tablet Take 1 tablet by mouth daily.  90 tablet  0  . raltegravir (ISENTRESS) 400 MG tablet Take 1 tablet (400 mg total) by mouth 2 (two) times daily.  60 tablet  2  . temazepam (RESTORIL) 15 MG capsule Take 15 mg by mouth at bedtime as needed for sleep.      [name]. tenofovir (VIREAD) 300 MG tablet Take 1 tablet (300 mg total) by mouth every 7 (seven) days.  4 tablet  2  . benzonatate (TESSALON PERLES) 100 MG capsule Take 1 capsule (100 mg total) by mouth 3 (three) times daily as needed for cough.  20 capsule  0  . citalopram (CELEXA) 20 MG tablet Take 20 mg by mouth daily.      . sevelamer  carbonate (RENVELA) 2.4 G PACK Take 2.4 g by mouth 3 (three) times daily with meals.  90 each  6  . tetrahydrozoline 0.05 % ophthalmic solution Place 2 drops into both eyes daily as needed (for dry eyes).      . traMADol (ULTRAM) 50 MG tablet Take 1 tablet (50 mg total) by mouth every 6 (six) hours as needed.  15 tablet  0   No current facility-administered medications on file prior to visit.   Family History  Problem Relation Age of Onset  . Diabetes Mother   . Hypertension Mother   . Hypertension Sister   . Diabetes Sister    History   Social History  . Marital Status: Married    Spouse Name: N/A    Number of Children: N/A  . Years of Education: N/A   Occupational History  . Not on file.   Social History Main Topics  . Smoking status: Never Smoker   . Smokeless tobacco: Not on file  . Alcohol Use: No  . Drug Use: No  . Sexual Activity: Yes   Other Topics Concern  . Not on file   Social History Narrative  . No narrative on  file   Review of Systems  Constitutional: Negative.   HENT: Negative.   Eyes: Negative.   Respiratory: Negative.   Cardiovascular: Negative.      Objective:   Filed Vitals:   08/12/13 1732  BP: 198/100  Pulse: 80  Temp: 99.3 F (37.4 C)  Resp: 16   Physical Exam  Vitals reviewed. Constitutional: She is oriented to person, place, and time.  Neck: Normal range of motion. Neck supple.  Cardiovascular: Normal rate, regular rhythm and normal heart sounds.   Pulmonary/Chest: Effort normal and breath sounds normal.  Abdominal: Soft. Bowel sounds are normal.  Musculoskeletal: She exhibits no edema and no tenderness.  Patient limited to wheelchair,  right lower extremity stiffness, no pain with passive range of motion  Neurological: She is alert and oriented to person, place, and time.  Skin: Skin is warm and dry.   l.  Lab Results  Component Value Date   WBC 2.4* 07/12/2013   HGB 9.6* 07/12/2013   HCT 31.1* 07/12/2013   MCV 89.1 07/12/2013   PLT 160 07/12/2013   Lab Results  Component Value Date   CREATININE 17.89* 07/10/2013   BUN 87* 07/10/2013   NA 137 07/10/2013   K 5.1 07/12/2013   CL 91* 07/10/2013   CO2 23 07/10/2013    No results found for this basename: HGBA1C   Lipid Panel  No results found for this basename: chol, trig, hdl, cholhdl, vldl, ldlcalc       Assessment and plan:   Bethany [name] was seen today for no specified reason.  Diagnoses and associated orders for this visit:  Right sided weakness - Ambulatory referral to Physical Therapy s/p stroke  Nausea with vomiting - ondansetron (ZOFRAN) 4 MG tablet; Take 1 tablet (4 mg total) by mouth every 8 (eight) hours as needed for nausea or vomiting. Likely related to ESRD.  HIV disease - Ambulatory referral to Infectious Disease  ESRD Continue with dialysis  Return in about 3 months (around 11/12/2013) for on [place] schedule.       Holland CommonsKECK, VALERIE, NP-C [facility] [phone] 08/14/2013, 6:02 PM

## 2013-08-14 ENCOUNTER — Emergency Department (HOSPITAL_COMMUNITY): Payer: Medicare Other

## 2013-08-14 ENCOUNTER — Encounter (HOSPITAL_COMMUNITY): Payer: Self-pay | Admitting: Emergency Medicine

## 2013-08-14 ENCOUNTER — Inpatient Hospital Stay (HOSPITAL_COMMUNITY)
Admission: EM | Admit: 2013-08-14 | Discharge: 2013-08-16 | DRG: 291 | Disposition: A | Discharge status: Home / Self Care | Payer: Medicare Other | Attending: Internal Medicine | Admitting: Internal Medicine

## 2013-08-14 DIAGNOSIS — Z9115 Patient's noncompliance with renal dialysis: Secondary | ICD-10-CM

## 2013-08-14 DIAGNOSIS — Z91158 Patient's noncompliance with renal dialysis for other reason: Secondary | ICD-10-CM

## 2013-08-14 DIAGNOSIS — J9601 Acute respiratory failure with hypoxia: Secondary | ICD-10-CM

## 2013-08-14 DIAGNOSIS — I10 Essential (primary) hypertension: Secondary | ICD-10-CM

## 2013-08-14 DIAGNOSIS — Z21 Asymptomatic human immunodeficiency virus [HIV] infection status: Secondary | ICD-10-CM | POA: Diagnosis present

## 2013-08-14 DIAGNOSIS — Z91040 Latex allergy status: Secondary | ICD-10-CM

## 2013-08-14 DIAGNOSIS — Z992 Dependence on renal dialysis: Secondary | ICD-10-CM

## 2013-08-14 DIAGNOSIS — I5023 Acute on chronic systolic (congestive) heart failure: Principal | ICD-10-CM | POA: Diagnosis present

## 2013-08-14 DIAGNOSIS — N039 Chronic nephritic syndrome with unspecified morphologic changes: Secondary | ICD-10-CM

## 2013-08-14 DIAGNOSIS — IMO0002 Reserved for concepts with insufficient information to code with codable children: Secondary | ICD-10-CM

## 2013-08-14 DIAGNOSIS — N189 Chronic kidney disease, unspecified: Secondary | ICD-10-CM

## 2013-08-14 DIAGNOSIS — N2581 Secondary hyperparathyroidism of renal origin: Secondary | ICD-10-CM | POA: Diagnosis present

## 2013-08-14 DIAGNOSIS — R112 Nausea with vomiting, unspecified: Secondary | ICD-10-CM

## 2013-08-14 DIAGNOSIS — B20 Human immunodeficiency virus [HIV] disease: Secondary | ICD-10-CM | POA: Diagnosis present

## 2013-08-14 DIAGNOSIS — J9600 Acute respiratory failure, unspecified whether with hypoxia or hypercapnia: Secondary | ICD-10-CM | POA: Diagnosis present

## 2013-08-14 DIAGNOSIS — I120 Hypertensive chronic kidney disease with stage 5 chronic kidney disease or end stage renal disease: Secondary | ICD-10-CM | POA: Diagnosis present

## 2013-08-14 DIAGNOSIS — I509 Heart failure, unspecified: Secondary | ICD-10-CM | POA: Diagnosis present

## 2013-08-14 DIAGNOSIS — N186 End stage renal disease: Secondary | ICD-10-CM | POA: Diagnosis present

## 2013-08-14 DIAGNOSIS — J810 Acute pulmonary edema: Secondary | ICD-10-CM

## 2013-08-14 DIAGNOSIS — Z8249 Family history of ischemic heart disease and other diseases of the circulatory system: Secondary | ICD-10-CM

## 2013-08-14 DIAGNOSIS — E860 Dehydration: Secondary | ICD-10-CM | POA: Diagnosis present

## 2013-08-14 DIAGNOSIS — D61818 Other pancytopenia: Secondary | ICD-10-CM | POA: Diagnosis present

## 2013-08-14 DIAGNOSIS — Z888 Allergy status to other drugs, medicaments and biological substances status: Secondary | ICD-10-CM

## 2013-08-14 DIAGNOSIS — D631 Anemia in chronic kidney disease: Secondary | ICD-10-CM | POA: Diagnosis present

## 2013-08-14 DIAGNOSIS — E43 Unspecified severe protein-calorie malnutrition: Secondary | ICD-10-CM | POA: Diagnosis present

## 2013-08-14 DIAGNOSIS — Z833 Family history of diabetes mellitus: Secondary | ICD-10-CM

## 2013-08-14 LAB — CBC WITH DIFFERENTIAL/PLATELET
Basophils Absolute: 0 10*3/uL (ref 0.0–0.1)
Basophils Relative: 1 % (ref 0–1)
Eosinophils Absolute: 0.1 10*3/uL (ref 0.0–0.7)
Eosinophils Relative: 5 % (ref 0–5)
HCT: 24 % — ABNORMAL LOW (ref 36.0–46.0)
Hemoglobin: 7.3 g/dL — ABNORMAL LOW (ref 12.0–15.0)
LYMPHS ABS: 0.7 10*3/uL (ref 0.7–4.0)
Lymphocytes Relative: 25 % (ref 12–46)
MCH: 26.9 pg (ref 26.0–34.0)
MCHC: 30.4 g/dL (ref 30.0–36.0)
MCV: 88.6 fL (ref 78.0–100.0)
Monocytes Absolute: 0.2 10*3/uL (ref 0.1–1.0)
Monocytes Relative: 6 % (ref 3–12)
NEUTROS PCT: 65 % (ref 43–77)
Neutro Abs: 1.8 10*3/uL (ref 1.7–7.7)
PLATELETS: 142 10*3/uL — AB (ref 150–400)
RBC: 2.71 MIL/uL — AB (ref 3.87–5.11)
RDW: 20.3 % — AB (ref 11.5–15.5)
WBC: 2.7 10*3/uL — AB (ref 4.0–10.5)

## 2013-08-14 LAB — COMPREHENSIVE METABOLIC PANEL
ALBUMIN: 3.4 g/dL — AB (ref 3.5–5.2)
ALK PHOS: 80 U/L (ref 39–117)
ALT: 8 U/L (ref 0–35)
AST: 19 U/L (ref 0–37)
BUN: 36 mg/dL — ABNORMAL HIGH (ref 6–23)
CO2: 28 mEq/L (ref 19–32)
Calcium: 10.2 mg/dL (ref 8.4–10.5)
Chloride: 94 mEq/L — ABNORMAL LOW (ref 96–112)
Creatinine, Ser: 8.65 mg/dL — ABNORMAL HIGH (ref 0.50–1.10)
GFR calc Af Amer: 5 mL/min — ABNORMAL LOW (ref >90)
GFR calc non Af Amer: 5 mL/min — ABNORMAL LOW (ref >90)
Glucose, Bld: 87 mg/dL (ref 70–99)
POTASSIUM: 4.7 meq/L (ref 3.7–5.3)
SODIUM: 140 meq/L (ref 137–147)
Total Bilirubin: 0.7 mg/dL (ref 0.3–1.2)
Total Protein: 8 g/dL (ref 6.0–8.3)

## 2013-08-14 LAB — PRO B NATRIURETIC PEPTIDE: Pro B Natriuretic peptide (BNP): >70000 pg/mL — ABNORMAL HIGH (ref 0–125)

## 2013-08-14 LAB — LIPASE, BLOOD: Lipase: 33 U/L (ref 11–59)

## 2013-08-14 MED ORDER — MORPHINE SULFATE 4 MG/ML IJ SOLN
4.0000 mg | Freq: Once | INTRAMUSCULAR | Status: AC
  Administered 2013-08-14: 4 mg via INTRAVENOUS
  Filled 2013-08-14: qty 1

## 2013-08-14 MED ORDER — AMLODIPINE BESYLATE 5 MG PO TABS
10.0000 mg | ORAL_TABLET | Freq: Once | ORAL | Status: AC
  Administered 2013-08-14: 10 mg via ORAL
  Filled 2013-08-14: qty 2

## 2013-08-14 MED ORDER — IOHEXOL 300 MG/ML  SOLN
100.0000 mL | Freq: Once | INTRAMUSCULAR | Status: AC | PRN
  Administered 2013-08-14: 80 mL via INTRAVENOUS

## 2013-08-14 MED ORDER — SODIUM CHLORIDE 0.9 % IV BOLUS (SEPSIS)
500.0000 mL | Freq: Once | INTRAVENOUS | Status: AC
  Administered 2013-08-14: 500 mL via INTRAVENOUS

## 2013-08-14 MED ORDER — SODIUM CHLORIDE 0.9 % IV SOLN: INTRAVENOUS | Status: DC

## 2013-08-14 MED ORDER — ONDANSETRON HCL 4 MG/2ML IJ SOLN
4.0000 mg | Freq: Once | INTRAMUSCULAR | Status: AC
  Administered 2013-08-14: 4 mg via INTRAVENOUS
  Filled 2013-08-14: qty 2

## 2013-08-14 NOTE — ED Notes (Signed)
Phlebotomy attempt x2 by phlebotomist and x2 by tiffany RN and x1 by jamelle EMT. Tiffany PA-C made aware.

## 2013-08-14 NOTE — ED Notes (Addendum)
Pt. Developed vomiting and sob last night.  Pt. Reports being treated for pneumonia 1 month ago.  Pt. Also has a productive cough. PT. Denies any chest pain.  Pt. Is in NAD, speaking sentences.  Skin is warm and dry.

## 2013-08-14 NOTE — ED Notes (Signed)
Patient in X-ray. X-ray notified patient has room.

## 2013-08-14 NOTE — ED Notes (Signed)
Family at bedside. 

## 2013-08-14 NOTE — ED Notes (Signed)
Pt. Reports vomiting last night and this am.  Pt. refuses any medication for nausea.  Instructed her to let us know if anything changes

## 2013-08-14 NOTE — ED Provider Notes (Signed)
CSN: [id number]     Arrival date & time 08/14/13  1745 History   First MD Initiated Contact with Patient 08/14/13 1851     Chief Complaint  Patient presents with  . Emesis     (Consider location/radiation/quality/duration/timing/severity/associated sxs/prior Treatment) HPI  Last dialyzed on 08/14/2013 ESRD- TTS @ [place]   Patient is HIV positive with ESRD on hemodialysis, and hypertension. She says that today she woke up and felt nauseous and has been vomiting. She also has pain to her LLQ. She reports that this happens multiple times a month and that no one knows what is causing it. She reports that it usually goes away on its own but today she continued to have episodes of vomiting and has been unable to keep her oral medications down. She last had dialysis yesterday. She reports no symptoms of SOB, lower extremity swelling, fatigue, fevers, diarrhea. She endorses nausea and pain at this time. In triage her BP is > 200 systolic which she describes as due to being unable to tolerate her PO BP medications.    Past Medical History  Diagnosis Date  . 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  [place] HD in [place] 2006, [place] in 2009.  Was receiving HD TTS schedule with a [facility].  Her nephrologist was Dr Vinnie Langtonada.  [place] and was not able to set up new HD unit prior to coming.  Last HD was Tuesday July 29th.  HD access is R FA AVF.  Gets heparin at the center, dry weight was 56kg, ran 3 hours.     [name]. HIV disease 09/29/2012    Diagnosed on 2000, on HIV meds.  On ART, viral levels undetectable per pt.     [name]. 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    . Hypertension 09/29/2012   Past Surgical History  Procedure Laterality Date  . Hernia repair     Family  History  Problem Relation Age of Onset  . Diabetes Mother   . Hypertension Mother   . Hypertension Sister   . Diabetes Sister    History  Substance Use Topics  . Smoking status: Never Smoker   . Smokeless tobacco: Not on file  . Alcohol Use: No   OB History   Grav Para Term Preterm Abortions TAB SAB Ect Mult Living                 Review of Systems   Review of Systems  Gen: no weight loss, fevers, chills, night sweats  Eyes: no discharge or drainage, no occular pain or visual changes  Nose: no epistaxis or rhinorrhea  Mouth: no dental pain, no sore throat  Neck: no neck pain  Lungs:No wheezing, coughing or hemoptysis CV: no chest pain, palpitations, dependent edema or orthopnea  Abd: + abdominal pain, nausea, vomiting, No diarrhea GU: no dysuria or gross hematuria  MSK:  No muscle weakness or pain Neuro: no headache, no focal neurologic deficits  Skin: no rash or wounds Psyche: no complaints    Allergies  Benadryl and Latex  Home Medications   Prior to Admission medications   Medication Sig Start Date End Date Taking? Authorizing Nera Haworth  abacavir (ZIAGEN) 300 MG tablet Take 1 tablet (  300 mg total) by mouth 2 (two) times daily. 03/12/13  Yes [name], MD  amLODipine (NORVASC) 10 MG tablet Take 10 mg by mouth daily.   Yes Historical Crystalee Ventress, MD  benzonatate (TESSALON PERLES) 100 MG capsule Take 1 capsule (100 mg total) by mouth 3 (three) times daily as needed for cough. 11/17/12  Yes Penny Pia, MD  cinacalcet (SENSIPAR) 30 MG tablet Take 30 mg by mouth at bedtime.   Yes Historical Tarell Schollmeyer, MD  etravirine (INTELENCE) 100 MG tablet Take 2 tablets (200 mg total) by mouth 2 (two) times daily with a meal. 03/12/13  Yes [name], MD  lisinopril (PRINIVIL,ZESTRIL) 20 MG tablet Take 20 mg by mouth daily.   Yes Historical Elda Dunkerson, MD  metoprolol succinate (TOPROL-XL) 25 MG 24 hr tablet Take 25 mg by mouth daily.   Yes Historical Aisa Schoeppner, MD   multivitamin (RENA-VIT) TABS tablet Take 1 tablet by mouth daily. 10/02/12  Yes Jay K. Allena Katz, MD  ondansetron (ZOFRAN) 4 MG tablet Take 1 tablet (4 mg total) by mouth every 8 (eight) hours as needed for nausea or vomiting. 08/12/13  Yes Ambrose Finland, NP  raltegravir (ISENTRESS) 400 MG tablet Take 1 tablet (400 mg total) by mouth 2 (two) times daily. 03/12/13  Yes [name], MD  raltegravir (ISENTRESS) 400 MG tablet Take 400 mg by mouth 2 (two) times daily.   Yes Historical Karan Inclan, MD  sertraline (ZOLOFT) 25 MG tablet Take 50 mg by mouth daily.    Yes Historical Drianna Chandran, MD  temazepam (RESTORIL) 15 MG capsule Take 15 mg by mouth at bedtime as needed for sleep.   Yes Historical Ubah Radke, MD  tenofovir (VIREAD) 300 MG tablet Take 300 mg by mouth every 7 (seven) days. Patient takes on saturday 03/12/13  Yes [name], MD  tetrahydrozoline 0.05 % ophthalmic solution Place 2 drops into both eyes daily as needed (for dry eyes).   Yes Historical Marina Boerner, MD   BP 181/89  Pulse 74  Temp(Src) 98.5 F (36.9 C) (Oral)  Resp 16  SpO2 98% Physical Exam  Nursing note and vitals reviewed. Constitutional: She appears well-developed and well-nourished. No distress.  HENT:  Head: Normocephalic and atraumatic.  Eyes: Pupils are equal, round, and reactive to light.  Neck: Normal range of motion. Neck supple.  Cardiovascular: Normal rate and regular rhythm.   Pulmonary/Chest: Effort normal.  Abdominal: Soft. Bowel sounds are normal. She exhibits no distension. There is tenderness (mild to LLQ). There is no guarding and negative Murphy's sign.    No peritoneal signs or guarding. Abdomen is soft.  Neurological: She is alert.  Skin: Skin is warm and dry.    ED Course  Procedures (including critical care time) Labs Review Labs Reviewed  CBC WITH DIFFERENTIAL - Abnormal; Notable for the following:    WBC 2.7 (*)    RBC 2.71 (*)    Hemoglobin 7.3 (*)    HCT 24.0 (*)    RDW 20.3 (*)     Platelets 142 (*)    All other components within normal limits  COMPREHENSIVE METABOLIC PANEL - Abnormal; Notable for the following:    Chloride 94 (*)    BUN 36 (*)    Creatinine, Ser 8.65 (*)    Albumin 3.4 (*)    GFR calc non Af Amer 5 (*)    GFR calc Af Amer 5 (*)    All other components within normal limits  PRO B NATRIURETIC PEPTIDE - Abnormal; Notable for the following:  Pro B Natriuretic peptide (BNP) >70000.0 (*)    All other components within normal limits  LIPASE, BLOOD  URINALYSIS, ROUTINE W REFLEX MICROSCOPIC    Imaging Review Dg Chest 2 View  08/14/2013   CLINICAL DATA:  Vomiting.  Short of breath.  Cough.  EXAM: CHEST  2 VIEW  COMPARISON:  07/10/2013.  FINDINGS: Previously seen right pleural effusion has resolved. Cardiomegaly is present with pulmonary vascular congestion and interstitial pulmonary edema. Mild blunting of the costophrenic angles on the lateral view compatible with small bilateral pleural effusions.  No free air is identified under the hemidiaphragms.  IMPRESSION: Mild CHF.   Electronically Signed   By: Andreas Newport M.D.   On: 08/14/2013 19:03   Ct Abdomen Pelvis W Contrast  08/14/2013   CLINICAL DATA:  Vomiting.  Short of breath.  EXAM: CT ABDOMEN AND PELVIS WITH CONTRAST  TECHNIQUE: Multidetector CT imaging of the abdomen and pelvis was performed using the standard protocol following bolus administration of intravenous contrast.  CONTRAST:  80mL OMNIPAQUE IOHEXOL 300 MG/ML  SOLN  COMPARISON:  None.  FINDINGS: Bones: No aggressive osseous lesions. Bilateral SI joint degenerative disease.  Lung Bases: Small bilateral pleural effusions. Cardiomegaly. Coronary artery atherosclerosis is present. If office based assessment of coronary risk factors has not been performed, it is now recommended. Interstitial and mild alveolar opacity compatible with edema and CHF.  Liver: Perihepatic ascites. Mild periportal edema. Tiny subcapsular low-density lesions probably  representing cysts. No discrete mass lesion.  Spleen:  Normal.  Gallbladder:  Hydropic gallbladder.  No calcified stones identified.  Common bile duct:  Normal.  Pancreas: Dilated pancreatic duct measuring up to 4 mm. No calcified obstructing stone or mass lesion of the pancreatic head is present. 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  Adrenal glands:  Normal.  Kidneys: Native renal atrophy. Renal cysts. Mild retroperitoneal adenopathy, also probably related to HIV.  Stomach:  Partially collapsed.  No gross inflammatory changes.  Small bowel:  No small bowel obstruction.  Colon: Normal appendix. No inflammatory changes or colonic obstruction.  Pelvic Genitourinary: Extensive uterine calcification which appears vascular. Urinary bladder is thickened and collapsed.  Vasculature: Atherosclerosis.  No acute vascular abnormality.  Body Wall: Calcifications in the posterior subcutaneous tissues in the chest. Varices in the anterior abdominal wall.  IMPRESSION: 1. CHF. 2. Small amount of ascites which may be secondary to CHF. 3. Distended gallbladder with mild dilation of the pancreatic duct. 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 4. Atrophic kidneys compatible with end-stage renal disease. 5. No left lower quadrant inflammatory changes identified.   Electronically Signed   By: Andreas Newport M.D.   On: 08/14/2013 23:27     EKG Interpretation None      MDM   Final diagnoses:  Nausea and vomiting  CHF (congestive heart failure)    Patient does not appear to be sick. She does have elevated BP which she was given her home dose of medications here. Her pain and nausea have been treated with PO medications. Multiple care givers attempted to get blood from patient but she is too dehydrated, therefore a small fluid bolus was given. Labs were eventually obtained. CT abd/pelv w contrast pending to further evaluate pain/N/V.  10:37pm Patient blood pressure improved with home mediation of  Norvasc given in ED to 188/96.   12:03 am Patients Ct abd pelvis show CHF. I was able to control her vomiting in the ED and he pain is under control. Patient  will need inpatient treatment for dialysis to get the fluid off of her lungs.  I spoke with Dr. Lovell SheehanJenkins who has agreed to admit to inpatient, tele, team 10, [facility].  Nephrology consulted and advised that patient needs to be seen early tomorrow morning to be dialyzed to get fluid off of her.  Nephrology- I spoke with Dr. Juel BurrowLin, who agrees that patient needs to be seen early tomorrow- patient has been put on his list.  Dorthula Matasiffany G Greene, PA-C 08/15/13 0101  Dorthula Matasiffany G Greene, PA-C 08/15/13 0104

## 2013-08-15 ENCOUNTER — Inpatient Hospital Stay (HOSPITAL_COMMUNITY): Payer: Medicare Other

## 2013-08-15 ENCOUNTER — Encounter (HOSPITAL_COMMUNITY): Payer: Self-pay | Admitting: Nephrology

## 2013-08-15 DIAGNOSIS — Z91040 Latex allergy status: Secondary | ICD-10-CM | POA: Diagnosis not present

## 2013-08-15 DIAGNOSIS — I5023 Acute on chronic systolic (congestive) heart failure: Principal | ICD-10-CM

## 2013-08-15 DIAGNOSIS — Z21 Asymptomatic human immunodeficiency virus [HIV] infection status: Secondary | ICD-10-CM | POA: Diagnosis present

## 2013-08-15 DIAGNOSIS — Z888 Allergy status to other drugs, medicaments and biological substances status: Secondary | ICD-10-CM | POA: Diagnosis not present

## 2013-08-15 DIAGNOSIS — D631 Anemia in chronic kidney disease: Secondary | ICD-10-CM | POA: Diagnosis present

## 2013-08-15 DIAGNOSIS — N2581 Secondary hyperparathyroidism of renal origin: Secondary | ICD-10-CM | POA: Diagnosis present

## 2013-08-15 DIAGNOSIS — R112 Nausea with vomiting, unspecified: Secondary | ICD-10-CM | POA: Diagnosis present

## 2013-08-15 DIAGNOSIS — I509 Heart failure, unspecified: Secondary | ICD-10-CM | POA: Diagnosis present

## 2013-08-15 DIAGNOSIS — IMO0002 Reserved for concepts with insufficient information to code with codable children: Secondary | ICD-10-CM | POA: Diagnosis not present

## 2013-08-15 DIAGNOSIS — Z833 Family history of diabetes mellitus: Secondary | ICD-10-CM | POA: Diagnosis not present

## 2013-08-15 DIAGNOSIS — N186 End stage renal disease: Secondary | ICD-10-CM

## 2013-08-15 DIAGNOSIS — J9600 Acute respiratory failure, unspecified whether with hypoxia or hypercapnia: Secondary | ICD-10-CM

## 2013-08-15 DIAGNOSIS — Z8249 Family history of ischemic heart disease and other diseases of the circulatory system: Secondary | ICD-10-CM | POA: Diagnosis not present

## 2013-08-15 DIAGNOSIS — B20 Human immunodeficiency virus [HIV] disease: Secondary | ICD-10-CM

## 2013-08-15 DIAGNOSIS — Z992 Dependence on renal dialysis: Secondary | ICD-10-CM | POA: Diagnosis not present

## 2013-08-15 DIAGNOSIS — D61818 Other pancytopenia: Secondary | ICD-10-CM | POA: Diagnosis present

## 2013-08-15 DIAGNOSIS — E860 Dehydration: Secondary | ICD-10-CM | POA: Diagnosis present

## 2013-08-15 DIAGNOSIS — I120 Hypertensive chronic kidney disease with stage 5 chronic kidney disease or end stage renal disease: Secondary | ICD-10-CM | POA: Diagnosis present

## 2013-08-15 DIAGNOSIS — Z9115 Patient's noncompliance with renal dialysis: Secondary | ICD-10-CM | POA: Diagnosis not present

## 2013-08-15 DIAGNOSIS — I10 Essential (primary) hypertension: Secondary | ICD-10-CM

## 2013-08-15 DIAGNOSIS — E43 Unspecified severe protein-calorie malnutrition: Secondary | ICD-10-CM | POA: Diagnosis present

## 2013-08-15 LAB — BASIC METABOLIC PANEL
BUN: 38 mg/dL — AB (ref 6–23)
CHLORIDE: 94 meq/L — AB (ref 96–112)
CO2: 29 mEq/L (ref 19–32)
CREATININE: 9.3 mg/dL — AB (ref 0.50–1.10)
Calcium: 10.1 mg/dL (ref 8.4–10.5)
GFR calc Af Amer: 5 mL/min — ABNORMAL LOW (ref >90)
GFR calc non Af Amer: 4 mL/min — ABNORMAL LOW (ref >90)
GLUCOSE: 83 mg/dL (ref 70–99)
Potassium: 4.9 mEq/L (ref 3.7–5.3)
Sodium: 139 mEq/L (ref 137–147)

## 2013-08-15 LAB — CBC
HCT: 24.4 % — ABNORMAL LOW (ref 36.0–46.0)
HEMOGLOBIN: 7.3 g/dL — AB (ref 12.0–15.0)
MCH: 26.5 pg (ref 26.0–34.0)
MCHC: 29.9 g/dL — AB (ref 30.0–36.0)
MCV: 88.7 fL (ref 78.0–100.0)
Platelets: 172 10*3/uL (ref 150–400)
RBC: 2.75 MIL/uL — ABNORMAL LOW (ref 3.87–5.11)
RDW: 20.2 % — AB (ref 11.5–15.5)
WBC: 3.1 10*3/uL — ABNORMAL LOW (ref 4.0–10.5)

## 2013-08-15 LAB — MRSA PCR SCREENING: MRSA by PCR: NEGATIVE

## 2013-08-15 LAB — HEPATITIS B SURFACE ANTIGEN: Hepatitis B Surface Ag: NEGATIVE

## 2013-08-15 MED ORDER — ACETAMINOPHEN 325 MG PO TABS: 650.0000 mg | ORAL_TABLET | Freq: Four times a day (QID) | ORAL | Status: DC | PRN

## 2013-08-15 MED ORDER — HYDROMORPHONE HCL PF 1 MG/ML IJ SOLN: 0.5000 mg | INTRAMUSCULAR | Status: DC | PRN

## 2013-08-15 MED ORDER — SODIUM CHLORIDE 0.9 % IV SOLN: 250.0000 mL | INTRAVENOUS | Status: DC | PRN

## 2013-08-15 MED ORDER — AMLODIPINE BESYLATE 10 MG PO TABS
10.0000 mg | ORAL_TABLET | Freq: Every day | ORAL | Status: DC
  Administered 2013-08-15 – 2013-08-16 (×2): 10 mg via ORAL
  Filled 2013-08-15 (×2): qty 1

## 2013-08-15 MED ORDER — SODIUM CHLORIDE 0.9 % IJ SOLN: 3.0000 mL | INTRAMUSCULAR | Status: DC | PRN

## 2013-08-15 MED ORDER — ONDANSETRON HCL 4 MG PO TABS: 4.0000 mg | ORAL_TABLET | Freq: Four times a day (QID) | ORAL | Status: DC | PRN

## 2013-08-15 MED ORDER — ALUM & MAG HYDROXIDE-SIMETH 200-200-20 MG/5ML PO SUSP: 30.0000 mL | Freq: Four times a day (QID) | ORAL | Status: DC | PRN

## 2013-08-15 MED ORDER — SERTRALINE HCL 50 MG PO TABS
50.0000 mg | ORAL_TABLET | Freq: Every day | ORAL | Status: DC
  Administered 2013-08-15 – 2013-08-16 (×2): 50 mg via ORAL
  Filled 2013-08-15 (×2): qty 1

## 2013-08-15 MED ORDER — SODIUM CHLORIDE 0.9 % IJ SOLN
3.0000 mL | Freq: Two times a day (BID) | INTRAMUSCULAR | Status: DC
  Administered 2013-08-15 – 2013-08-16 (×3): 3 mL via INTRAVENOUS

## 2013-08-15 MED ORDER — ACETAMINOPHEN 650 MG RE SUPP: 650.0000 mg | Freq: Four times a day (QID) | RECTAL | Status: DC | PRN

## 2013-08-15 MED ORDER — RENA-VITE PO TABS
1.0000 | ORAL_TABLET | Freq: Every day | ORAL | Status: DC
  Administered 2013-08-15 – 2013-08-16 (×2): 1 via ORAL
  Filled 2013-08-15 (×2): qty 1

## 2013-08-15 MED ORDER — ENOXAPARIN SODIUM 30 MG/0.3ML ~~LOC~~ SOLN
30.0000 mg | SUBCUTANEOUS | Status: DC
  Administered 2013-08-16: 30 mg via SUBCUTANEOUS
  Filled 2013-08-15 (×4): qty 0.3

## 2013-08-15 MED ORDER — METOPROLOL SUCCINATE ER 25 MG PO TB24
25.0000 mg | ORAL_TABLET | Freq: Every day | ORAL | Status: DC
  Administered 2013-08-15 – 2013-08-16 (×2): 25 mg via ORAL
  Filled 2013-08-15 (×2): qty 1

## 2013-08-15 MED ORDER — OXYCODONE HCL 5 MG PO TABS: 5.0000 mg | ORAL_TABLET | ORAL | Status: DC | PRN

## 2013-08-15 MED ORDER — TENOFOVIR DISOPROXIL FUMARATE 300 MG PO TABS: 300.0000 mg | ORAL_TABLET | ORAL | Status: DC

## 2013-08-15 MED ORDER — TEMAZEPAM 15 MG PO CAPS: 15.0000 mg | ORAL_CAPSULE | Freq: Every evening | ORAL | Status: DC | PRN

## 2013-08-15 MED ORDER — ETRAVIRINE 100 MG PO TABS
200.0000 mg | ORAL_TABLET | Freq: Two times a day (BID) | ORAL | Status: DC
  Administered 2013-08-15 – 2013-08-16 (×3): 200 mg via ORAL
  Filled 2013-08-15 (×6): qty 2

## 2013-08-15 MED ORDER — TENOFOVIR DISOPROXIL FUMARATE 300 MG PO TABS
300.0000 mg | ORAL_TABLET | ORAL | Status: DC
  Filled 2013-08-15: qty 1

## 2013-08-15 MED ORDER — ISOSORB DINITRATE-HYDRALAZINE 20-37.5 MG PO TABS
1.0000 | ORAL_TABLET | Freq: Two times a day (BID) | ORAL | Status: DC
  Administered 2013-08-15 – 2013-08-16 (×3): 1 via ORAL
  Filled 2013-08-15 (×4): qty 1

## 2013-08-15 MED ORDER — ABACAVIR SULFATE 300 MG PO TABS
300.0000 mg | ORAL_TABLET | Freq: Two times a day (BID) | ORAL | Status: DC
  Administered 2013-08-15 – 2013-08-16 (×3): 300 mg via ORAL
  Filled 2013-08-15 (×6): qty 1

## 2013-08-15 MED ORDER — CINACALCET HCL 30 MG PO TABS
30.0000 mg | ORAL_TABLET | Freq: Every day | ORAL | Status: DC
  Administered 2013-08-15 – 2013-08-16 (×2): 30 mg via ORAL
  Filled 2013-08-15 (×2): qty 1

## 2013-08-15 MED ORDER — ONDANSETRON HCL 4 MG/2ML IJ SOLN
4.0000 mg | Freq: Four times a day (QID) | INTRAMUSCULAR | Status: DC | PRN
  Administered 2013-08-16: 4 mg via INTRAVENOUS
  Filled 2013-08-15: qty 2

## 2013-08-15 MED ORDER — RALTEGRAVIR POTASSIUM 400 MG PO TABS
400.0000 mg | ORAL_TABLET | Freq: Two times a day (BID) | ORAL | Status: DC
  Administered 2013-08-15 – 2013-08-16 (×3): 400 mg via ORAL
  Filled 2013-08-15 (×6): qty 1

## 2013-08-15 NOTE — Progress Notes (Signed)
Patient seen and examined. Admitted secondary to SOB, nausea and vomiting. Patient reports non been compliant with HD treatment properly. Symptoms most likely due to HF and mild uremia. Referred to H&P by Dr. Lovell SheehanJenkins for further details/info on admission.  Plan: -HD for volume control and BP -will discontinue lisinopril and start bidil for better control -PRN antiemetics  Vassie LollMadera, Krystyn Picking [phone]

## 2013-08-15 NOTE — Progress Notes (Signed)
[facility] Renal Consultation Note  Indication for Consultation:  Management of ESRD/hemodialysis; anemia, hypertension/volume and secondary hyperparathyroidism  HPI: Bethany [name] is a 53 y.o. female with a history of hypertension, HIV, and ESRD on dialysis at the [facility] who presented to the ED yesterday with 24 hours of worsening dyspnea, nausea, and vomiting.  She denies any fever, chills, or diarrhea.  Generally she is noncompliant with her dialysis, and since she ran a full treatment on 6/6, she has completed only 2:29 on 6/13 and 2:28 on 6/16.  Her chest x-ray yesterday indicated pulmonary edema and bilateral effusions; otherwise, imaging showed minimal sludge and probable tiny non-shadowing calculi within the gallbladder, but was mostly unremarkable.  She is currently on dialysis and feeling slightly better.   Past Medical History  Diagnosis Date  . 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  Started HD in [place] in 2006, moved to [place] and rec'd dialysis in [place] starting in 2009.  Was receiving HD TTS schedule with a [facility].  Her nephrologist was Dr Bufford Lope.  She moved to [facility] this week (Sep 29, 2012) and was not able to set up new HD unit prior to coming.  Last HD was Tuesday July 29th.  HD access is R FA AVF.  Gets heparin at the center, dry weight was 56kg, ran 3 hours.     Bethany [name] HIV disease 09/29/2012    Diagnosed on 2000, on HIV meds.  On ART, viral levels undetectable per pt.     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    . Hypertension 09/29/2012   Past Surgical History  Procedure Laterality Date  . Hernia repair     Family History  Problem Relation Age of Onset  . Diabetes Mother   . Hypertension Mother   . Hypertension Sister   . Diabetes Sister    Social History  She denies any  history of tobacco or illicit drugs and no longer drinks alcohol.  Allergies  Allergen Reactions  . Benadryl [Diphenhydramine Hcl] Other (See Comments)    Makes patient jittery  . Latex Hives and Itching   Prior to Admission medications   Medication Sig Start Date End Date Taking? Authorizing Provider  abacavir (ZIAGEN) 300 MG tablet Take 1 tablet (300 mg total) by mouth 2 (two) times daily. 03/12/13  Yes [name], MD  amLODipine (NORVASC) 10 MG tablet Take 10 mg by mouth daily.   Yes Historical Provider, MD  benzonatate (TESSALON PERLES) 100 MG capsule Take 1 capsule (100 mg total) by mouth 3 (three) times daily as needed for cough. 11/17/12  Yes Velvet Bathe, MD  cinacalcet (SENSIPAR) 30 MG tablet Take 30 mg by mouth at bedtime.   Yes Historical Provider, MD  etravirine (INTELENCE) 100 MG tablet Take 2 tablets (200 mg total) by mouth 2 (two) times daily with a meal. 03/12/13  Yes [name], MD  lisinopril (PRINIVIL,ZESTRIL) 20 MG tablet Take 20 mg by mouth daily.   Yes Historical Provider, MD  metoprolol succinate (TOPROL-XL) 25 MG 24 hr tablet Take 25 mg by mouth daily.   Yes Historical Provider, MD  multivitamin (RENA-VIT) TABS tablet Take 1 tablet by mouth daily. 10/02/12  Yes Jay K. Posey Pronto, MD  ondansetron (ZOFRAN) 4 MG tablet Take 1 tablet (4 mg total)  by mouth every 8 (eight) hours as needed for nausea or vomiting. 08/12/13  Yes Lance Bosch, NP  raltegravir (ISENTRESS) 400 MG tablet Take 1 tablet (400 mg total) by mouth 2 (two) times daily. 03/12/13  Yes [name], MD  raltegravir (ISENTRESS) 400 MG tablet Take 400 mg by mouth 2 (two) times daily.   Yes Historical Provider, MD  sertraline (ZOLOFT) 25 MG tablet Take 50 mg by mouth daily.    Yes Historical Provider, MD  temazepam (RESTORIL) 15 MG capsule Take 15 mg by mouth at bedtime as needed for sleep.   Yes Historical Provider, MD  tenofovir (VIREAD) 300 MG tablet Take 300 mg by mouth every 7 (seven) days. Patient  takes on saturday 03/12/13  Yes [name], MD  tetrahydrozoline 0.05 % ophthalmic solution Place 2 drops into both eyes daily as needed (for dry eyes).   Yes Historical Provider, MD   Labs:  Results for orders placed during the hospital encounter of 08/14/13 (from the past 48 hour(s))  CBC WITH DIFFERENTIAL     Status: Abnormal   Collection Time    08/14/13  9:34 PM      Result Value Ref Range   WBC 2.7 (*) 4.0 - 10.5 K/uL   RBC 2.71 (*) 3.87 - 5.11 MIL/uL   Hemoglobin 7.3 (*) 12.0 - 15.0 g/dL   Comment: REPEATED TO VERIFY   HCT 24.0 (*) 36.0 - 46.0 %   MCV 88.6  78.0 - 100.0 fL   MCH 26.9  26.0 - 34.0 pg   MCHC 30.4  30.0 - 36.0 g/dL   RDW 20.3 (*) 11.5 - 15.5 %   Platelets 142 (*) 150 - 400 K/uL   Neutrophils Relative % 65  43 - 77 %   Neutro Abs 1.8  1.7 - 7.7 K/uL   Lymphocytes Relative 25  12 - 46 %   Lymphs Abs 0.7  0.7 - 4.0 K/uL   Monocytes Relative 6  3 - 12 %   Monocytes Absolute 0.2  0.1 - 1.0 K/uL   Eosinophils Relative 5  0 - 5 %   Eosinophils Absolute 0.1  0.0 - 0.7 K/uL   Basophils Relative 1  0 - 1 %   Basophils Absolute 0.0  0.0 - 0.1 K/uL  COMPREHENSIVE METABOLIC PANEL     Status: Abnormal   Collection Time    08/14/13  9:34 PM      Result Value Ref Range   Sodium 140  137 - 147 mEq/L   Potassium 4.7  3.7 - 5.3 mEq/L   Chloride 94 (*) 96 - 112 mEq/L   CO2 28  19 - 32 mEq/L   Glucose, Bld 87  70 - 99 mg/dL   BUN 36 (*) 6 - 23 mg/dL   Creatinine, Ser 8.65 (*) 0.50 - 1.10 mg/dL   Calcium 10.2  8.4 - 10.5 mg/dL   Total Protein 8.0  6.0 - 8.3 g/dL   Albumin 3.4 (*) 3.5 - 5.2 g/dL   AST 19  0 - 37 U/L   ALT 8  0 - 35 U/L   Alkaline Phosphatase 80  39 - 117 U/L   Total Bilirubin 0.7  0.3 - 1.2 mg/dL   GFR calc non Af Amer 5 (*) >90 mL/min   GFR calc Af Amer 5 (*) >90 mL/min   Comment: (NOTE)     The eGFR has been calculated using the CKD EPI equation.     This calculation has  not been validated in all clinical situations.     eGFR's persistently  <90 mL/min signify possible Chronic Kidney     Disease.  LIPASE, BLOOD     Status: None   Collection Time    08/14/13  9:34 PM      Result Value Ref Range   Lipase 33  11 - 59 U/L  PRO B NATRIURETIC PEPTIDE     Status: Abnormal   Collection Time    08/14/13  9:34 PM      Result Value Ref Range   Pro B Natriuretic peptide (BNP) >70000.0 (*) 0 - 125 pg/mL  BASIC METABOLIC PANEL     Status: Abnormal   Collection Time    08/15/13  5:03 AM      Result Value Ref Range   Sodium 139  137 - 147 mEq/L   Potassium 4.9  3.7 - 5.3 mEq/L   Chloride 94 (*) 96 - 112 mEq/L   CO2 29  19 - 32 mEq/L   Glucose, Bld 83  70 - 99 mg/dL   BUN 38 (*) 6 - 23 mg/dL   Creatinine, Ser 9.30 (*) 0.50 - 1.10 mg/dL   Calcium 10.1  8.4 - 10.5 mg/dL   GFR calc non Af Amer 4 (*) >90 mL/min   GFR calc Af Amer 5 (*) >90 mL/min   Comment: (NOTE)     The eGFR has been calculated using the CKD EPI equation.     This calculation has not been validated in all clinical situations.     eGFR's persistently <90 mL/min signify possible Chronic Kidney     Disease.  CBC     Status: Abnormal   Collection Time    08/15/13  5:03 AM      Result Value Ref Range   WBC 3.1 (*) 4.0 - 10.5 K/uL   RBC 2.75 (*) 3.87 - 5.11 MIL/uL   Hemoglobin 7.3 (*) 12.0 - 15.0 g/dL   HCT 24.4 (*) 36.0 - 46.0 %   MCV 88.7  78.0 - 100.0 fL   MCH 26.5  26.0 - 34.0 pg   MCHC 29.9 (*) 30.0 - 36.0 g/dL   RDW 20.2 (*) 11.5 - 15.5 %   Platelets 172  150 - 400 K/uL   Constitutional: negative for chills, fatigue, fevers and sweats Ears, nose, mouth, throat, and face: negative for earaches, hoarseness, nasal congestion and sore throat Respiratory: positive for dyspnea on exertion; negative for cough, hemoptysis and sputum Cardiovascular: positive for dyspnea, negative for chest pain, chest pressure/discomfort, orthopnea and palpitations Gastrointestinal: positive for nausea with abdominal pain better and vomiting resolved; negative for change in bowel  habits Genitourinary:negative, anuric Musculoskeletal:negative for myalgias and neck pain Neurological: negative for dizziness, gait problems, headaches, paresthesia and speech problems  Physical Exam: Filed Vitals:   08/15/13 1000  BP: 128/73  Pulse: 76  Temp:   Resp:      General appearance: alert, cooperative and no distress Head: Normocephalic, without obvious abnormality, atraumatic Neck: no adenopathy, no carotid bruit, no JVD and supple, symmetrical, trachea midline Resp: diminished breath sounds at bases; otherwise, clear Cardio: regular rate and rhythm, S1, S2 normal, no murmur, click, rub or gallop GI: soft, non-tender; bowel sounds normal; no masses,  no organomegaly Extremities: extremities normal, atraumatic, no cyanosis or edema Neurologic: Grossly normal Dialysis Access: AVG @ RFA with + bruit   Dialysis Orders:  TTS @ [facility] 4 hrs      50 kg  2K/2.5Ca       450/A1.5       Heparin 3000 U        AVG @ RFA  Hectorol 13 mcg      Aranesp 100 mcg on Tues          No Venofer  Assessment/Plan: 1. Dyspnea - acute CHF per CXR, improving with HD. 2. N&V - possibly uremic sec to poor compliance with HD, improving with HD. 3. ESRD - HD on TTS @ [facility], K 4.9.  HD today. 4. Hypertension/volume - outpatient meds include Amlodipine, Lisinopril, & Metoprolol; BP initially elevated (SBP > 200), now decreasing on HD with UF goal 2.5 L; CXR shows pulmonary edema, but only 1.8 L over EDW..   5. Anemia - Hgb 7.3, generally low sec to missed HD, on Aranesp 100 mcg on Tues; last Fe sat 24% with ferritin 1384. 6. Metabolic bone disease - Ca 9.7, P 8, iPTH 155; Hectorol 13 mcg, Sensipar 30 mg qd, no binder. 7. HIV - on antivirals.  LYLES,CHARLES 08/15/2013, 10:25 AM   Attending Nephrologist: Roney Jaffe, MD  Pt seen, examined and agree w A/P as above. 53 ESRD patient w hx of HTN and HIV presented with N/V for several days and also some SOB.  CXR was read as early CHF but the  findings are not impressive to me.  She is on HD now and nausea is better. No abd pain or change in bowel habits.  There is no gross vol overload, will see how she does with volume removal.   Kelly Splinter MD pager [phone]    cell [phone] 08/15/2013, 11:45 AM

## 2013-08-15 NOTE — Care Management Note (Addendum)
    Page 1 of 1   08/16/2013     11:38:48 AM CARE MANAGEMENT NOTE 08/16/2013  Patient:  Bethany [name],Bethany [name]   Account Number:  [id number]  Date Initiated:  08/15/2013  Documentation initiated by:  Oletta CohnWOOD,CAMELLIA  Subjective/Objective Assessment:   53 y.o. female  admitted with  acute CHF. //Home with spouse.     Action/Plan:   Monitor O2 sats and BPs,  Renal Notified by EDP, Notify Dialysis Team in AM  IV Anti-Emetics.  //[facility]   Anticipated DC Date:  08/19/2013   Anticipated DC Plan:  HOME W HOME HEALTH SERVICES      DC Planning Services  CM consult      Choice offered to / List presented to:          [facility]  [facility] RN  HH-10 DISEASE MANAGEMENT      HH agency  [facility]   Status of service:  In process, will continue to follow Medicare Important Message given?   (If response is "NO", the following Medicare IM given date fields will be blank) Date Medicare IM given:   Date Additional Medicare IM given:    Discharge Disposition:    Per UR Regulation:  Reviewed for med. necessity/level of care/duration of stay  If discussed at Long Length of Stay Meetings, dates discussed:    Comments:  08/16/13 1015 Camellia J. Lucretia RoersWood, RN, BSN, [employer] [phone] Spoke with pt at bedside regarding [facility]. Offered pt list of home health agencies to choose from.  Pt chose [facility] to render services. Kizzie Furnishonna Fellmy, [facility] of [facility] notified.  No DME needs identified at this time.

## 2013-08-15 NOTE — H&P (Signed)
[facility] History and Physical  [name] QMV:[id number] DOB: Aug 19, 1960 DOA: 08/14/2013  Referring physician: EDP PCP: [name], MD  Specialists:   Chief Complaint:  Nausea and Vomiting and ABD Pain  HPI: Bethany [name] is a 53 y.o. female with a history of ESRD on HD, chronic Systolic CHF, HTN, and HIV Dz who presents to the ED with complaints of nausea and vomiting x   Days and worsening SOB past 24 hours.  She has not been able to hold down food , liquids, or her medications.  She denies having fevers or chills or diarrhea.  She denies having any hematemesis.  She reports attending her dialysis sessions, and had her last dialysis yesterday, she received dialysis on Tuesdays, Thursdays and Saturdays.   She was evaluated in the ED and was found to have Pulmonary Edema on Chest X-ray, her O2 sats ranged from  92-100%.   She was referred for medical admission.      Review of Systems:  Constitutional: No Weight Loss, No Weight Gain, Night Sweats, Fevers, Chills, Fatigue, or Generalized Weakness HEENT: No Headaches, Difficulty Swallowing,Tooth/Dental Problems,Sore Throat,  No Sneezing, Rhinitis, Ear Ache, Nasal Congestion, or Post Nasal Drip,  Cardio-vascular:  No Chest pain, Orthopnea, PND, Edema in lower extremities, Anasarca, Dizziness, Palpitations  Resp: No Dyspnea, No DOE, No Cough, No Hemoptysis, No Wheezing.    GI: No Heartburn, Indigestion, +Abdominal Pain, +Nausea, +Vomiting, Diarrhea, Change in Bowel Habits,  Loss of Appetite  GU: No Dysuria, Change in Color of Urine, No Urgency or Frequency.  No flank pain.  Musculoskeletal: No Joint Pain or Swelling.  No Decreased Range of Motion. No Back Pain.  Neurologic: No Syncope, No Seizures, Muscle Weakness, Paresthesia, Vision Disturbance or Loss, No Diplopia, No Vertigo, No Difficulty Walking,  Skin: No Rash or Lesions. Psych: No Change in Mood or Affect. No Depression or Anxiety. No Memory loss. No Confusion or  Hallucinations   Past Medical History  Diagnosis Date  . 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  Started HD in [place] in 2006, moved to [place] and rec'd dialysis in [place] starting in 2009.  Was receiving HD TTS schedule with a [facility].  Her nephrologist was Dr Vinnie Langton.  She moved to [facility] this week (Sep 29, 2012) and was not able to set up new HD unit prior to coming.  Last HD was Tuesday July 29th.  HD access is R FA AVF.  Gets heparin at the center, dry weight was 56kg, ran 3 hours.     [name] HIV disease 09/29/2012    Diagnosed on 2000, on HIV meds.  On ART, viral levels undetectable per pt.     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    . Hypertension 09/29/2012     Past Surgical History  Procedure Laterality Date  . Hernia repair         Prior to Admission medications   Medication Sig Start Date End Date Taking? Authorizing Provider  abacavir (ZIAGEN) 300 MG tablet Take 1 tablet (300 mg total) by mouth 2 (two) times daily. 03/12/13  Yes [name], MD  amLODipine (NORVASC) 10 MG tablet Take 10 mg by mouth daily.   Yes Historical Provider, MD  benzonatate (TESSALON PERLES) 100 MG capsule Take 1 capsule (100 mg total) by mouth 3 (  three) times daily as needed for cough. 11/17/12  Yes Penny Pia, MD  cinacalcet (SENSIPAR) 30 MG tablet Take 30 mg by mouth at bedtime.   Yes Historical Provider, MD  etravirine (INTELENCE) 100 MG tablet Take 2 tablets (200 mg total) by mouth 2 (two) times daily with a meal. 03/12/13  Yes [name], MD  lisinopril (PRINIVIL,ZESTRIL) 20 MG tablet Take 20 mg by mouth daily.   Yes Historical Provider, MD  metoprolol succinate (TOPROL-XL) 25 MG 24 hr tablet Take 25 mg by mouth daily.   Yes Historical Provider, MD  multivitamin (RENA-VIT) TABS tablet Take 1 tablet by mouth daily. 10/02/12  Yes Jay K.  Allena Katz, MD  ondansetron (ZOFRAN) 4 MG tablet Take 1 tablet (4 mg total) by mouth every 8 (eight) hours as needed for nausea or vomiting. 08/12/13  Yes Ambrose Finland, NP  raltegravir (ISENTRESS) 400 MG tablet Take 1 tablet (400 mg total) by mouth 2 (two) times daily. 03/12/13  Yes [name], MD  raltegravir (ISENTRESS) 400 MG tablet Take 400 mg by mouth 2 (two) times daily.   Yes Historical Provider, MD  sertraline (ZOLOFT) 25 MG tablet Take 50 mg by mouth daily.    Yes Historical Provider, MD  temazepam (RESTORIL) 15 MG capsule Take 15 mg by mouth at bedtime as needed for sleep.   Yes Historical Provider, MD  tenofovir (VIREAD) 300 MG tablet Take 300 mg by mouth every 7 (seven) days. Patient takes on saturday 03/12/13  Yes [name], MD  tetrahydrozoline 0.05 % ophthalmic solution Place 2 drops into both eyes daily as needed (for dry eyes).   Yes Historical Provider, MD      Allergies  Allergen Reactions  . Benadryl [Diphenhydramine Hcl] Other (See Comments)    Makes patient jittery  . Latex Hives and Itching     Social History:  reports that she has never smoked. She does not have any smokeless tobacco history on file. She reports that she does not drink alcohol or use illicit drugs.     Family History  Problem Relation Age of Onset  . Diabetes Mother   . Hypertension Mother   . Hypertension Sister   . Diabetes Sister        Physical Exam:  GEN:  Pleasant Obese 53 y.o. African American female examined and in no acute distress; cooperative with exam Filed Vitals:   08/14/13 2200 08/14/13 2329 08/15/13 0100 08/15/13 0148  BP: 188/96 181/89 162/85 176/81  Pulse: 77 74 72 77  Temp:    98.2 F (36.8 C)  TempSrc:    Oral  Resp: 16 16  18   Height:    5\' 2"  (1.575 m)  Weight:    51.71 kg (114 lb)  SpO2: 100% 98% 95% 92%   Blood pressure 176/81, pulse 77, temperature 98.2 F (36.8 C), temperature source Oral, resp. rate 18, height 5\' 2"  (1.575 m), weight 51.71 kg  (114 lb), SpO2 92.00%. PSYCH: She is alert and oriented x4; does not appear anxious does not appear depressed; affect is normal HEENT: Normocephalic and Atraumatic, Mucous membranes pink; PERRLA; EOM intact; Fundi:  Benign;  No scleral icterus, Nares: Patent, Oropharynx: Clear,  Neck:  FROM, no cervical lymphadenopathy nor thyromegaly or carotid bruit; no JVD; Breasts:: Not examined CHEST WALL: No tenderness CHEST: Normal respiration, clear to auscultation bilaterally HEART: Regular rate and rhythm; no murmurs rubs or gallops BACK: No kyphosis or scoliosis; no CVA tenderness ABDOMEN: Positive Bowel Sounds, Obese, soft non-tender; no  masses, no organomegaly, no pannus; no intertriginous candida. Rectal Exam: Not done EXTREMITIES: No cyanosis, clubbing or edema; no ulcerations. Genitalia: not examined PULSES: 2+ and symmetric SKIN: Normal hydration no rash or ulceration CNS:  Alert and Oriented X 4,  No Focal deficits.   Vascular: pulses palpable throughout    Labs on Admission:  Basic Metabolic Panel:  Recent Labs Lab 08/14/13 2134  NA 140  K 4.7  CL 94*  CO2 28  GLUCOSE 87  BUN 36*  CREATININE 8.65*  CALCIUM 10.2   Liver Function Tests:  Recent Labs Lab 08/14/13 2134  AST 19  ALT 8  ALKPHOS 80  BILITOT 0.7  PROT 8.0  ALBUMIN 3.4*    Recent Labs Lab 08/14/13 2134  LIPASE 33   No results found for this basename: AMMONIA,  in the last 168 hours CBC:  Recent Labs Lab 08/14/13 2134  WBC 2.7*  NEUTROABS 1.8  HGB 7.3*  HCT 24.0*  MCV 88.6  PLT 142*   Cardiac Enzymes: No results found for this basename: CKTOTAL, CKMB, CKMBINDEX, TROPONINI,  in the last 168 hours  BNP (last 3 results)  Recent Labs  11/15/12 1805 03/23/13 2103 08/14/13 2134  PROBNP >70000.0* >70000.0* >70000.0*   CBG: No results found for this basename: GLUCAP,  in the last 168 hours  Radiological Exams on Admission: Dg Chest 2 View  08/14/2013   CLINICAL DATA:  Vomiting.  Short  of breath.  Cough.  EXAM: CHEST  2 VIEW  COMPARISON:  07/10/2013.  FINDINGS: Previously seen right pleural effusion has resolved. Cardiomegaly is present with pulmonary vascular congestion and interstitial pulmonary edema. Mild blunting of the costophrenic angles on the lateral view compatible with small bilateral pleural effusions.  No free air is identified under the hemidiaphragms.  IMPRESSION: Mild CHF.   Electronically Signed   By: Andreas NewportGeoffrey  Lamke M.D.   On: 08/14/2013 19:03   Ct Abdomen Pelvis W Contrast  08/14/2013   CLINICAL DATA:  Vomiting.  Short of breath.  EXAM: CT ABDOMEN AND PELVIS WITH CONTRAST  TECHNIQUE: Multidetector CT imaging of the abdomen and pelvis was performed using the standard protocol following bolus administration of intravenous contrast.  CONTRAST:  80mL OMNIPAQUE IOHEXOL 300 MG/ML  SOLN  COMPARISON:  None.  FINDINGS: Bones: No aggressive osseous lesions. Bilateral SI joint degenerative disease.  Lung Bases: Small bilateral pleural effusions. Cardiomegaly. Coronary artery atherosclerosis is present. If office based assessment of coronary risk factors has not been performed, it is now recommended. Interstitial and mild alveolar opacity compatible with edema and CHF.  Liver: Perihepatic ascites. Mild periportal edema. Tiny subcapsular low-density lesions probably representing cysts. No discrete mass lesion.  Spleen:  Normal.  Gallbladder:  Hydropic gallbladder.  No calcified stones identified.  Common bile duct:  Normal.  Pancreas: Dilated pancreatic duct measuring up to 4 mm. No calcified obstructing stone or mass lesion of the pancreatic head is present. 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  Adrenal glands:  Normal.  Kidneys: Native renal atrophy. Renal cysts. Mild retroperitoneal adenopathy, also probably related to HIV.  Stomach:  Partially collapsed.  No gross inflammatory changes.  Small bowel:  No small bowel obstruction.  Colon: Normal appendix. No  inflammatory changes or colonic obstruction.  Pelvic Genitourinary: Extensive uterine calcification which appears vascular. Urinary bladder is thickened and collapsed.  Vasculature: Atherosclerosis.  No acute vascular abnormality.  Body Wall: Calcifications in the posterior subcutaneous tissues in the chest. Varices in the anterior abdominal wall.  IMPRESSION: 1. CHF. 2. Small amount of ascites which may be secondary to CHF. 3. Distended gallbladder with mild dilation of the pancreatic duct. 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 4. Atrophic kidneys compatible with end-stage renal disease. 5. No left lower quadrant inflammatory changes identified.   Electronically Signed   By: Andreas NewportGeoffrey  Lamke M.D.   On: 08/14/2013 23:27      EKG: Independently reviewed.     Assessment/Plan:   53 y.o. female with  Principal Problem:   Acute CHF Active Problems:   ESRD on hemodialysis   HIV disease   Hypertension   Anemia in CKD (chronic kidney disease)   Systolic CHF, acute on chronic   CHF (congestive heart failure)   Nausea and vomiting   ABD Pain   Pancytopenia    1.  Acute on Chronic Systolic CHF- O2 sats 98-100% on NCO2, No Hypoxia.  Monitor O2 sats and BPs,  Renal Notified by EDP, Notify Dialysis Team in AM.     2.  Nausea and Vomiting-  Due to Uremia,  IV Anti-Emetics PRN.    3.  ADB Pain-  ABD US ordered , due to findings on CT scan of Distended Gallbladder and mild dilatation of the pancreatic duct, monitor LFTs.    4.  ESRD on HD- on a Tues, Thurs, Sat dialysis Schedule and denies missing any treatments.    5.  HTN-   Uncontrolled-  IV Hydralazine, and Resume PO Anti-Hypertensives when taking PO .    6.  HIV Dz-  Continue  HAART Rx.    7.  Anemia of Chronic Renal Dz-    8.  Pancytopenia-  Due to #4, , #6, and Medications,  May need further workup as outpatient.    8.  DVT Prophylaxis with Lovenox.      Code Status:  FULL CODE  Family Communication:    No Family  present Disposition Plan:       Inpatient  Time spent:  60 MInutes  Ron ParkerJENKINS,HARVETTE C [facility] Pager [phone]  If 7PM-7AM, please contact night-coverage [email or web address] Password [email or web address] 08/15/2013, 1:58 AM

## 2013-08-15 NOTE — Procedures (Signed)
I was present at this dialysis session, have reviewed the session itself and made  appropriate changes  Vinson Moselleob Ryott Rafferty MD (pgr) [phone]    [phone] 08/15/2013, 12:10 PM

## 2013-08-16 DIAGNOSIS — N189 Chronic kidney disease, unspecified: Secondary | ICD-10-CM

## 2013-08-16 DIAGNOSIS — E43 Unspecified severe protein-calorie malnutrition: Secondary | ICD-10-CM | POA: Insufficient documentation

## 2013-08-16 DIAGNOSIS — D631 Anemia in chronic kidney disease: Secondary | ICD-10-CM

## 2013-08-16 DIAGNOSIS — N039 Chronic nephritic syndrome with unspecified morphologic changes: Secondary | ICD-10-CM

## 2013-08-16 DIAGNOSIS — J810 Acute pulmonary edema: Secondary | ICD-10-CM

## 2013-08-16 MED ORDER — NEPRO/CARBSTEADY PO LIQD
237.0000 mL | Freq: Every day | ORAL | Status: DC
  Administered 2013-08-16: 237 mL via ORAL
  Filled 2013-08-16 (×2): qty 237

## 2013-08-16 MED ORDER — PANTOPRAZOLE SODIUM 40 MG PO TBEC: 40.0000 mg | DELAYED_RELEASE_TABLET | Freq: Every day | ORAL | Status: AC

## 2013-08-16 MED ORDER — ISOSORB DINITRATE-HYDRALAZINE 20-37.5 MG PO TABS: 1.0000 | ORAL_TABLET | Freq: Two times a day (BID) | ORAL | Status: DC

## 2013-08-16 MED ORDER — NEPRO/CARBSTEADY PO LIQD: 237.0000 mL | Freq: Every day | ORAL | Status: AC

## 2013-08-16 NOTE — Discharge Summary (Signed)
Physician Discharge Summary  Bethany Bethany [name] ZOX:[id number] DOB: [birth year] DOA: 08/14/2013  PCP: [name], MD  Admit date: 08/14/2013 Discharge date: 08/16/2013  Time spent: >30 minutes  Recommendations for Outpatient Follow-up:  1. BMET to follow electrolytes 2. Reassess BP and adjust medications as needed   Discharge Diagnoses:  Principal Problem:   Acute CHF Active Problems:   ESRD on hemodialysis   HIV disease   Hypertension   Anemia in CKD (chronic kidney disease)   Systolic CHF, acute on chronic   CHF (congestive heart failure)   Nausea and vomiting   Protein-calorie malnutrition, severe   Discharge Condition: stable and improved. Will discharge home with [facility] services and family care  Diet recommendation: low sodium diet   Filed Weights   08/15/13 0750 08/15/13 1201 08/16/13 0543  Weight: 51.8 kg (114 lb 3.2 oz) 48.7 kg (107 lb 5.8 oz) 47.6 kg (104 lb 15 oz)    History of present illness:  53 y.o. Bethany [name] with a history of ESRD on HD, chronic Systolic CHF, HTN, and HIV Dz who presents to the ED with complaints of nausea and vomiting x Days and worsening SOB past 24 hours. She has not been able to hold down food , liquids, or her medications. She denies having fevers or chills or diarrhea. She denies having any hematemesis. She reports attending her dialysis sessions, and had her last dialysis yesterday, she received dialysis on Tuesdays, Thursdays and Saturdays. She was evaluated in the ED and was found to have Pulmonary Edema on Chest X-ray, her O2 sats on arrival to ED were in the 89% on RA. She was referred for medical admission.    Hospital Course:  1-nausea/vomiting: with concerns for GERD and mild uremia -patient started on PPI -advise to be compliant with HD therapies and to stay throughout whole treatment -patient advise to take medications around food intake to minimize effect of nausea from them (especially ART)  2-acute resp failure with hypoxia:  secondary to mild pulmonary edema from acute on chronic systolic heart failure -resolved with HD -plan is to lower dry weight as an outpatient for better control of her volume status -patient advise to follow low sodium diet and to check her weight on daily basis -will continue b-blocker and will start bidil -lisinopril discontinue as patient on ESRD/HD; no effective  3-HTN: much better at discharge  -patient advise to follow low sodium diet -has been started on BIDIL BID -will continue metoprolol -plan is also to lower EDW for future HD  4-ESRD: continue HD T-T-S  5-hyperparathyroidism: continue renvela  6-HIV: continue ART; patient will follow with infectious disease service as an outpatient  7-AOCD: continue aranesp  Procedures:  See below for x-ray reports   Consultations:  Renal service   Discharge Exam: Filed Vitals:   08/16/13 1408  BP: 140/76  Pulse: 72  Temp: 98.2 F (36.8 C)  Resp: 20   GEN: Alert, no distress, talking with visitor  Neck: No thyromegaly, No jvd  Heart: regular rate, no rubs or gallops   Lungs: CTA bilaterally  Abd: soft, NT, ND; positive BS Extremities: No LE edema; no cyanosis; RFA AVG patent  Neuro: non focal; AAOx3  Discharge Instructions You were cared for by a hospitalist during your hospital stay. If you have any questions about your discharge medications or the care you received while you were in the hospital after you are discharged, you can call the unit and asked to speak with the hospitalist on call if the  hospitalist that took care of you is not available. Once you are discharged, your primary care physician will handle any further medical issues. Please note that NO REFILLS for any discharge medications will be authorized once you are discharged, as it is imperative that you return to your primary care physician (or establish a relationship with a primary care physician if you do not have one) for your aftercare needs so that they  can reassess your need for medications and monitor your lab values.  Discharge Instructions   Diet - low sodium heart healthy    Complete by:  As directed      Discharge instructions    Complete by:  As directed   Take medications as prescribed Please follow a low sodium diet (less than 2 grams daily) Arrange follow up with PCP in 10 days Be compliant with your HD (including length of therapy)            Medication List    STOP taking these medications       lisinopril 20 MG tablet  Commonly known as:  PRINIVIL,ZESTRIL      TAKE these medications       abacavir 300 MG tablet  Commonly known as:  ZIAGEN  Take 1 tablet (300 mg total) by mouth 2 (two) times daily.     amLODipine 10 MG tablet  Commonly known as:  NORVASC  Take 10 mg by mouth daily.     benzonatate 100 MG capsule  Commonly known as:  TESSALON PERLES  Take 1 capsule (100 mg total) by mouth 3 (three) times daily as needed for cough.     cinacalcet 30 MG tablet  Commonly known as:  SENSIPAR  Take 30 mg by mouth at bedtime.     etravirine 100 MG tablet  Commonly known as:  INTELENCE  Take 2 tablets (200 mg total) by mouth 2 (two) times daily with a meal.     feeding supplement (NEPRO CARB STEADY) Liqd  Take 237 mLs by mouth daily.     isosorbide-hydrALAZINE 20-37.5 MG per tablet  Commonly known as:  BIDIL  Take 1 tablet by mouth 2 (two) times daily.     metoprolol succinate 25 MG 24 hr tablet  Commonly known as:  TOPROL-XL  Take 25 mg by mouth daily.     multivitamin Tabs tablet  Take 1 tablet by mouth daily.     ondansetron 4 MG tablet  Commonly known as:  ZOFRAN  Take 1 tablet (4 mg total) by mouth every 8 (eight) hours as needed for nausea or vomiting.     pantoprazole 40 MG tablet  Commonly known as:  PROTONIX  Take 1 tablet (40 mg total) by mouth daily.     raltegravir 400 MG tablet  Commonly known as:  ISENTRESS  Take 400 mg by mouth 2 (two) times daily.     sertraline 25 MG tablet   Commonly known as:  ZOLOFT  Take 50 mg by mouth daily.     temazepam 15 MG capsule  Commonly known as:  RESTORIL  Take 15 mg by mouth at bedtime as needed for sleep.     tenofovir 300 MG tablet  Commonly known as:  VIREAD  Take 300 mg by mouth every 7 (seven) days. Patient takes on saturday     tetrahydrozoline 0.05 % ophthalmic solution  Place 2 drops into both eyes daily as needed (for dry eyes).       Allergies  Allergen Reactions  .  Benadryl [Diphenhydramine Hcl] Other (See Comments)    Makes patient jittery  . Latex Hives and Itching       Follow-up Information   Follow up with [facility]. (RN)    Contact information:   [address] [place] [address] [phone]       Follow up with Jeanann LewandowskyJEGEDE, OLUGBEMIGA, MD. Schedule an appointment as soon as possible for a visit in 10 days.   Specialty:  Internal Medicine   Contact information:   [address] [place] [address] [phone]        The results of significant diagnostics from this hospitalization (including imaging, microbiology, ancillary and laboratory) are listed below for reference.    Significant Diagnostic Studies: Dg Chest 2 View  08/14/2013   CLINICAL DATA:  Vomiting.  Short of breath.  Cough.  EXAM: CHEST  2 VIEW  COMPARISON:  07/10/2013.  FINDINGS: Previously seen right pleural effusion has resolved. Cardiomegaly is present with pulmonary vascular congestion and interstitial pulmonary edema. Mild blunting of the costophrenic angles on the lateral view compatible with small bilateral pleural effusions.  No free air is identified under the hemidiaphragms.  IMPRESSION: Mild CHF.   Electronically Signed   By: Andreas NewportGeoffrey  Lamke M.D.   On: 08/14/2013 19:03   [place] Abdomen Complete  08/15/2013   CLINICAL DATA:  RIGHT upper quadrant abdominal pain, history hypertension, end-stage renal disease on dialysis, HIV  EXAM: ULTRASOUND ABDOMEN COMPLETE  COMPARISON:  CT abdomen  08/14/2013  FINDINGS: Gallbladder:  Scattered mobile bile intraluminal echogenic material, likely small amount of sludge and questionably few tiny non shadowing calculi up to 3 mm diameter. No gallbladder wall thickening or sonographic Murphy sign. Small amount of pericholecystic fluid.  Common bile duct:  Diameter: 4 mm diameter common normal  Liver:  Heterogeneous slightly increased echogenicity question fatty infiltration though this can be seen with cirrhosis uncertain infiltrative disorders. No focal hepatic mass or nodularity.  IVC:  Normal appearance  Pancreas:  Mildly heterogeneous echogenicity. Minimally prominent pancreatic duct 4 mm diameter similar to prior CT. No discrete mass or calcification.  Spleen:  Small heterogeneous, 6.1 cm length, nonspecific.  No focal mass.  Right Kidney:  Length: 7.9 cm. Atrophic with thinned echogenic cortex. Multiple cysts, largest complex mid upper pole 13 x 9 x 14 mm containing internal echogenicity and septations. No definite hydronephrosis.  Left Kidney:  Length: 7.1 cm. Atrophic with thinned echogenic cortex. Small minimally complicated cysts containing internal echogenicity largest 9 x 9 x 9 mm.  Abdominal aorta:  Normal caliber.  Atherosclerotic plaque formation seen  Other findings:  Trace ascites.  BILATERAL pleural effusions.  IMPRESSION: Heterogeneous increased echogenicity liver, can be seen with fatty infiltration cirrhosis uncertain infiltrative disorders, though liver did not demonstrate fat attenuation on CT exam of 08/14/2013.  Heterogeneous increased echogenicity of the spleen without evidence of calcification or mass lesion on prior CT.  This may reflect parenchymal changes as a result of prior infection in a patient with HIV.  No discrete splenic mass identified.  Atrophic kidneys with complex cystic changes bilaterally consistent with history of end-stage renal disease and dialysis ; followup ultrasound recommended in 6 months to reassess the complex  cystic lesions.  Minimal sludge and probable tiny non shadowing calculi within gallbladder.  Minimal ascites and BILATERAL pleural effusions.   Electronically Signed   By: Ulyses SouthwardMark  Boles M.D.   On: 08/15/2013 09:10   Ct Abdomen Pelvis W Contrast  08/14/2013   CLINICAL  DATA:  Vomiting.  Short of breath.  EXAM: CT ABDOMEN AND PELVIS WITH CONTRAST  TECHNIQUE: Multidetector CT imaging of the abdomen and pelvis was performed using the standard protocol following bolus administration of intravenous contrast.  CONTRAST:  80mL OMNIPAQUE IOHEXOL 300 MG/ML  SOLN  COMPARISON:  None.  FINDINGS: Bones: No aggressive osseous lesions. Bilateral SI joint degenerative disease.  Lung Bases: Small bilateral pleural effusions. Cardiomegaly. Coronary artery atherosclerosis is present. If office based assessment of coronary risk factors has not been performed, it is now recommended. Interstitial and mild alveolar opacity compatible with edema and CHF.  Liver: Perihepatic ascites. Mild periportal edema. Tiny subcapsular low-density lesions probably representing cysts. No discrete mass lesion.  Spleen:  Normal.  Gallbladder:  Hydropic gallbladder.  No calcified stones identified.  Common bile duct:  Normal.  Pancreas: Dilated pancreatic duct measuring up to 4 mm. No calcified obstructing stone or mass lesion of the pancreatic head is present. 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  Adrenal glands:  Normal.  Kidneys: Native renal atrophy. Renal cysts. Mild retroperitoneal adenopathy, also probably related to HIV.  Stomach:  Partially collapsed.  No gross inflammatory changes.  Small bowel:  No small bowel obstruction.  Colon: Normal appendix. No inflammatory changes or colonic obstruction.  Pelvic Genitourinary: Extensive uterine calcification which appears vascular. Urinary bladder is thickened and collapsed.  Vasculature: Atherosclerosis.  No acute vascular abnormality.  Body Wall: Calcifications in the posterior  subcutaneous tissues in the chest. Varices in the anterior abdominal wall.  IMPRESSION: 1. CHF. 2. Small amount of ascites which may be secondary to CHF. 3. Distended gallbladder with mild dilation of the pancreatic duct. 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 4. Atrophic kidneys compatible with end-stage renal disease. 5. No left lower quadrant inflammatory changes identified.   Electronically Signed   By: Andreas Newport M.D.   On: 08/14/2013 23:27    Microbiology: Recent Results (from the past 240 hour(s))  MRSA PCR SCREENING     Status: None   Collection Time    08/15/13 12:50 PM      Result Value Ref Range Status   MRSA by PCR NEGATIVE  NEGATIVE Final   Comment:            The GeneXpert MRSA Assay (FDA     approved for NASAL specimens     only), is one component of a     comprehensive MRSA colonization     surveillance program. It is not     intended to diagnose MRSA     infection nor to guide or     monitor treatment for     MRSA infections.     Labs: Basic Metabolic Panel:  Recent Labs Lab 08/14/13 2134 08/15/13 0503  NA 140 139  K 4.7 4.9  CL 94* 94*  CO2 28 29  GLUCOSE 87 83  BUN 36* 38*  CREATININE 8.65* 9.30*  CALCIUM 10.2 10.1   Liver Function Tests:  Recent Labs Lab 08/14/13 2134  AST 19  ALT 8  ALKPHOS 80  BILITOT 0.7  PROT 8.0  ALBUMIN 3.4*    Recent Labs Lab 08/14/13 2134  LIPASE 33   CBC:  Recent Labs Lab 08/14/13 2134 08/15/13 0503  WBC 2.7* 3.1*  NEUTROABS 1.8  --   HGB 7.3* 7.3*  HCT 24.0* 24.4*  MCV 88.6 88.7  PLT 142* 172   BNP (last 3 results)  Recent Labs  11/15/12 1805 03/23/13 2103 08/14/13 2134  PROBNP >70000.0* >70000.0* >70000.0*    Signed:  Vassie LollMadera, Carlos  [facility] 08/16/2013, 4:12 PM

## 2013-08-16 NOTE — Progress Notes (Signed)
Reviewed dietetic intern note. Agree with intervention. RD to continue to monitor. Ian Malkineanne Barnett RD, LDN Inpatient Clinical Dietitian Pager: [phone] After Hours Pager: [phone]

## 2013-08-16 NOTE — Progress Notes (Signed)
  [facility] Progress Note   Subjective: feels much better, n/v gone  Filed Vitals:   08/15/13 2048 08/15/13 2329 08/16/13 0543 08/16/13 0921  BP: 120/73 148/86 144/90 149/74  Pulse: 78 77 77 73  Temp: 98.4 F (36.9 C)  98.6 F (37 C) 98 F (36.7 C)  TempSrc: Oral  Oral Oral  Resp: 18  18   Height:      Weight:   47.6 kg (104 lb 15 oz)   SpO2: 100%  99% 100%   Exam: Alert, no distress, talking with visitor No jvd Chest clear bilat RRR no MRG Abd soft, NTND No LE edea RFA AVG patent Neuro is nf, Ox3  HD:  [facility] [place]  4 hrs    50 kg    2K/2.5Ca     450/A1.5    Heparin 3000     AVG @ RFA  Hectorol 13 mcg    Aranesp 100 mcg on Tues      No Venofer         Assessment: 1 Nausea/ vomiting- improved, eating solid foods, per primary 2 SOB / mild pulm edema- pt has lost body wt, below dry wt now 3 ESRD on HD 4 HTN cont meds 5 Anemia cont aranesp 6 HPTH cont meds 7 HIV on ART 8 Dispo- per primary, stable from renal standpoint  Plan- stable from renal standpoint. Lower dry wt.     Vinson Moselleob Schertz MD  pager [phone]    cell [phone]  08/16/2013, 12:31 PM     Recent Labs Lab 08/14/13 2134 08/15/13 0503  NA 140 139  K 4.7 4.9  CL 94* 94*  CO2 28 29  GLUCOSE 87 83  BUN 36* 38*  CREATININE 8.65* 9.30*  CALCIUM 10.2 10.1    Recent Labs Lab 08/14/13 2134  AST 19  ALT 8  ALKPHOS 80  BILITOT 0.7  PROT 8.0  ALBUMIN 3.4*    Recent Labs Lab 08/14/13 2134 08/15/13 0503  WBC 2.7* 3.1*  NEUTROABS 1.8  --   HGB 7.3* 7.3*  HCT 24.0* 24.4*  MCV 88.6 88.7  PLT 142* 172   . abacavir  300 mg Oral BID  . amLODipine  10 mg Oral Daily  . cinacalcet  30 mg Oral Q supper  . enoxaparin (LOVENOX) injection  30 mg Subcutaneous Q24H  . etravirine  200 mg Oral BID WC  . feeding supplement (NEPRO CARB STEADY)  237 mL Oral Daily  . isosorbide-hydrALAZINE  1 tablet Oral BID  . metoprolol succinate  25 mg Oral Daily  . multivitamin  1 tablet Oral Daily   . raltegravir  400 mg Oral BID  . sertraline  50 mg Oral Daily  . sodium chloride  3 mL Intravenous Q12H  . [START ON 08/17/2013] tenofovir  300 mg Oral Q Sat     sodium chloride, acetaminophen, acetaminophen, alum & mag hydroxide-simeth, HYDROmorphone (DILAUDID) injection, ondansetron (ZOFRAN) IV, ondansetron, oxyCODONE, sodium chloride, temazepam

## 2013-08-16 NOTE — ED Provider Notes (Signed)
  Face-to-face evaluation   History: Pt with vomiting and abdominal pain  Physical exam:Alert and cooperative. Abdomen is not distended. She is hypertensive  Medical screening examination/treatment/procedure(s) were conducted as a shared visit with non-physician practitioner(s) and myself.  I personally evaluated the patient during the encounter  Flint MelterElliott L Wentz, MD 08/16/13 1521

## 2013-08-16 NOTE — Progress Notes (Signed)
Discussed discharge instructions with pt including how and when to call the dr, follow up appts, medications to take at home, activity, and diet. Pt verbalized understanding. Prescriptions given, and pt acknowledged receipt.   Juliane LackLeah Aljean Horiuchi, RN

## 2013-08-16 NOTE — Progress Notes (Signed)
INITIAL NUTRITION ASSESSMENT  DOCUMENTATION CODES Per approved criteria  -Severe malnutrition in the context of chronic illness   Pt meets criteria for severe MALNUTRITION in the context of chronic illness as evidenced by weight loss of 18.1% x 6 months and severe depletion of muscle mass.   INTERVENTION:  Nepro Shake daily, each supplement providing 425 kcal and 19 grams of protein  RD to continue tofollow  NUTRITION DIAGNOSIS: Inadequate oral intake related to poor appetite as evidenced by pt report of poor PO intake x 2 months.   Goal: Pt to meet >/= 90% of estimated nutrition needs   Monitor:  PO intake, supplement acceptance, weight trend, labs   Reason for Assessment: Positive Malnutrition Screening Tool Score   53 y.o. female  Admitting Dx: Acute CHF  ASSESSMENT: Patient is a 53 y.o. female with a history of ESRD on HD, chronic Systolic CHF, HTN, and HIV Dz who presents to the ED with complaints of nausea and vomiting x Days and worsening SOB past 24 hours. She has not been able to hold down food , liquids, or her medications. She reports attending her dialysis sessions, and had her last dialysis yesterday, she received dialysis on Tuesdays, Thursdays and Saturdays. She was evaluated in the ED and was found to have Pulmonary Edema on Chest X-ray, her O2 sats ranged from 92-100%.  Pt reports that she has not been eating well for 1-2 months due to poor appetite. She states that she got pneumonia 1-2 months ago and since then she has not been able to eat like normal. She reports trying to eat things like eggs and soup.   She reports unintentional weight loss recently and states that her normal weight is 119 lbs. The last time she weighed this was 2 months ago. Per chart review, pt has weight loss of 23 lbs x 6 months (18.1%), which is severe for time frame.   Doc flowsheet records indicate pt ate 100% of her meals yesterday 6/18 and 45% of breakfast today 6/19. Will order  Nepro daily to supplement if pt eats poorly. Encouraged pt to drink Nepro shake if she does not eat well at her meals.   Nutrition Focused Physical Exam:  Subcutaneous Fat:  Orbital Region: wnl Upper Arm Region: mild depletion  Thoracic and Lumbar Region: wnl  Muscle:  Temple Region: wnl Clavicle Bone Region: wnl Clavicle and Acromion Bone Region: wnl Scapular Bone Region: wnl Dorsal Hand: wnl Patellar Region: severe depletion  Anterior Thigh Region: severe depletion  Posterior Calf Region: severe depletion   Edema: none   Height: Ht Readings from Last 1 Encounters:  08/15/13 5\' 2"  (1.575 m)    Weight: Wt Readings from Last 1 Encounters:  08/16/13 104 lb 15 oz (47.6 kg)    Ideal Body Weight: 50 kg   % Ideal Body Weight: 95%   Wt Readings from Last 10 Encounters:  08/16/13 104 lb 15 oz (47.6 kg)  08/12/13 111 lb 12.8 oz (50.712 kg)  07/12/13 111 lb 5.3 oz (50.5 kg)  03/26/13 113 lb 1.5 oz (51.3 kg)  03/12/13 127 lb (57.607 kg)  11/16/12 126 lb 12.2 oz (57.5 kg)  10/02/12 117 lb 8.1 oz (53.3 kg)    Usual Body Weight: 119 lbs   % Usual Body Weight: 87%   BMI:  Body mass index is 19.19 kg/(m^2). Normal   Estimated Nutritional Needs: Kcal: 1250 - 1450  Protein: 60 - 75 grams  Fluid: 1.2 L/day   Skin: intact  Diet Order: Renal  EDUCATION NEEDS: -No education needs identified at this time   Intake/Output Summary (Last 24 hours) at 08/16/13 1017 Last data filed at 08/16/13 0931  Gross per 24 hour  Intake    963 ml  Output   2936 ml  Net  -1973 ml    Last BM: 6/17   Labs:   Recent Labs Lab 08/14/13 2134 08/15/13 0503  NA 140 139  K 4.7 4.9  CL 94* 94*  CO2 28 29  BUN 36* 38*  CREATININE 8.65* 9.30*  CALCIUM 10.2 10.1  GLUCOSE 87 83    CBG (last 3)  No results found for this basename: GLUCAP,  in the last 72 hours  Scheduled Meds: . abacavir  300 mg Oral BID  . amLODipine  10 mg Oral Daily  . cinacalcet  30 mg Oral Q supper  .  enoxaparin (LOVENOX) injection  30 mg Subcutaneous Q24H  . etravirine  200 mg Oral BID WC  . isosorbide-hydrALAZINE  1 tablet Oral BID  . metoprolol succinate  25 mg Oral Daily  . multivitamin  1 tablet Oral Daily  . raltegravir  400 mg Oral BID  . sertraline  50 mg Oral Daily  . sodium chloride  3 mL Intravenous Q12H  . [START ON 08/17/2013] tenofovir  300 mg Oral Q Sat    Continuous Infusions:   Past Medical History  Diagnosis Date  . 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  [place] in [place] 2006. [place] [facility] [place] [facility] [place].  [place] and [facility] on [address] on a TTS schedule.  HD access is right forearm AVF.  She had a failed attempt at L arm access in the past that didn't mature.   [name]. HIV disease 09/29/2012    Diagnosed on 2000, on HIV meds.  On ART, viral levels undetectable per pt.     [name]. 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    . Hypertension 09/29/2012    Past Surgical History  Procedure Laterality Date  . Hernia repair    . Cesarean section      Eppie Gibsonebekah L Matznick, BS Dietetic Intern Pager: [phone]

## 2013-08-28 ENCOUNTER — Ambulatory Visit: Payer: 59 | Admitting: Internal Medicine

## 2013-09-06 ENCOUNTER — Telehealth: Payer: Self-pay | Admitting: Internal Medicine

## 2013-09-06 NOTE — Telephone Encounter (Signed)
Erie NoeVanessa, RN calling to request orders to continue pt's home care. Please f/u with nurse.

## 2013-09-09 ENCOUNTER — Ambulatory Visit: Payer: Medicare Other | Attending: Internal Medicine | Admitting: Physical Therapy

## 2013-09-10 NOTE — Telephone Encounter (Signed)
Unable to reach pt. Phone disconnected.

## 2013-09-20 NOTE — Telephone Encounter (Signed)
Please follow up on this and see what all needs to be in letter.  Patient is ESRD, wheelchair bound, and has right sided weakness.

## 2013-09-20 NOTE — Telephone Encounter (Signed)
Please f/u

## 2013-09-20 NOTE — Telephone Encounter (Signed)
Pt's [facility] requesting a "statement of medical neccessity" for pt. Please contact case manager- Lenard ForthBeth Petty

## 2013-10-03 NOTE — Telephone Encounter (Signed)
Wilkie A[facility]Kristy from [facility] calling to say that the statement of medical necessity has not been received yet. Please f/u with the case manager.

## 2013-11-06 ENCOUNTER — Telehealth: Payer: Self-pay

## 2013-11-06 NOTE — Telephone Encounter (Signed)
Bethany [name], Bridge Counselor is working with client who has dialysis on Tuesday, Thursday and Saturday.  She will need an intake on a different date.  I will schedule her for Friday, November 15, 2013 @ 10:00 am.   Laurell Josephs, [place]

## 2013-11-08 ENCOUNTER — Ambulatory Visit: Payer: Medicare Other

## 2013-11-11 ENCOUNTER — Ambulatory Visit: Payer: 59 | Admitting: Internal Medicine

## 2013-11-15 ENCOUNTER — Ambulatory Visit: Payer: Medicare Other

## 2013-12-10 ENCOUNTER — Emergency Department (HOSPITAL_COMMUNITY)
Admission: EM | Admit: 2013-12-10 | Discharge: 2013-12-10 | Disposition: A | Discharge status: Home / Self Care | Payer: Medicare Other | Attending: Emergency Medicine | Admitting: Emergency Medicine

## 2013-12-10 DIAGNOSIS — Z79899 Other long term (current) drug therapy: Secondary | ICD-10-CM | POA: Insufficient documentation

## 2013-12-10 DIAGNOSIS — R05 Cough: Secondary | ICD-10-CM | POA: Diagnosis present

## 2013-12-10 DIAGNOSIS — Z91040 Latex allergy status: Secondary | ICD-10-CM | POA: Diagnosis not present

## 2013-12-10 DIAGNOSIS — R0789 Other chest pain: Secondary | ICD-10-CM | POA: Insufficient documentation

## 2013-12-10 DIAGNOSIS — Z8661 Personal history of infections of the central nervous system: Secondary | ICD-10-CM | POA: Diagnosis not present

## 2013-12-10 DIAGNOSIS — Z992 Dependence on renal dialysis: Secondary | ICD-10-CM | POA: Diagnosis not present

## 2013-12-10 DIAGNOSIS — Z21 Asymptomatic human immunodeficiency virus [HIV] infection status: Secondary | ICD-10-CM | POA: Diagnosis not present

## 2013-12-10 DIAGNOSIS — I120 Hypertensive chronic kidney disease with stage 5 chronic kidney disease or end stage renal disease: Secondary | ICD-10-CM | POA: Insufficient documentation

## 2013-12-10 DIAGNOSIS — J069 Acute upper respiratory infection, unspecified: Secondary | ICD-10-CM

## 2013-12-10 DIAGNOSIS — N186 End stage renal disease: Secondary | ICD-10-CM | POA: Insufficient documentation

## 2013-12-10 MED ORDER — AZITHROMYCIN 250 MG PO TABS: 250.0000 mg | ORAL_TABLET | Freq: Every day | ORAL | Status: DC

## 2013-12-10 NOTE — Discharge Instructions (Signed)
Upper Respiratory Infection, Adult An upper respiratory infection (URI) is also sometimes known as the common cold. The upper respiratory tract includes the nose, sinuses, throat, trachea, and bronchi. Bronchi are the airways leading to the lungs. Most people improve within 1 week, but symptoms can last up to 2 weeks. A residual cough may last even longer.  CAUSES Many different viruses can infect the tissues lining the upper respiratory tract. The tissues become irritated and inflamed and often become very moist. Mucus production is also common. A cold is contagious. You can easily spread the virus to others by oral contact. This includes kissing, sharing a glass, coughing, or sneezing. Touching your mouth or nose and then touching a surface, which is then touched by another person, can also spread the virus. SYMPTOMS  Symptoms typically develop 1 to 3 days after you come in contact with a cold virus. Symptoms vary from person to person. They may include:  Runny nose.  Sneezing.  Nasal congestion.  Sinus irritation.  Sore throat.  Loss of voice (laryngitis).  Cough.  Fatigue.  Muscle aches.  Loss of appetite.  Headache.  Low-grade fever. DIAGNOSIS  You might diagnose your own cold based on familiar symptoms, since most people get a cold 2 to 3 times a year. Your caregiver can confirm this based on your exam. Most importantly, your caregiver can check that your symptoms are not due to another disease such as strep throat, sinusitis, pneumonia, asthma, or epiglottitis. Blood tests, throat tests, and X-rays are not necessary to diagnose a common cold, but they may sometimes be helpful in excluding other more serious diseases. Your caregiver will decide if any further tests are required. RISKS AND COMPLICATIONS  You may be at risk for a more severe case of the common cold if you smoke cigarettes, have chronic heart disease (such as heart failure) or lung disease (such as asthma), or if  you have a weakened immune system. The very young and very old are also at risk for more serious infections. Bacterial sinusitis, middle ear infections, and bacterial pneumonia can complicate the common cold. The common cold can worsen asthma and chronic obstructive pulmonary disease (COPD). Sometimes, these complications can require emergency medical care and may be life-threatening. PREVENTION  The best way to protect against getting a cold is to practice good hygiene. Avoid oral or hand contact with people with cold symptoms. Wash your hands often if contact occurs. There is no clear evidence that vitamin C, vitamin E, echinacea, or exercise reduces the chance of developing a cold. However, it is always recommended to get plenty of rest and practice good nutrition. TREATMENT  Treatment is directed at relieving symptoms. There is no cure. Antibiotics are not effective, because the infection is caused by a virus, not by bacteria. Treatment may include:  Increased fluid intake. Sports drinks offer valuable electrolytes, sugars, and fluids.  Breathing heated mist or steam (vaporizer or shower).  Eating chicken soup or other clear broths, and maintaining good nutrition.  Getting plenty of rest.  Using gargles or lozenges for comfort.  Controlling fevers with ibuprofen or acetaminophen as directed by your caregiver.  Increasing usage of your inhaler if you have asthma. Zinc gel and zinc lozenges, taken in the first 24 hours of the common cold, can shorten the duration and lessen the severity of symptoms. Pain medicines may help with fever, muscle aches, and throat pain. A variety of non-prescription medicines are available to treat congestion and runny nose. Your caregiver   can make recommendations and may suggest nasal or lung inhalers for other symptoms.  HOME CARE INSTRUCTIONS   Only take over-the-counter or prescription medicines for pain, discomfort, or fever as directed by your  caregiver.  Use a warm mist humidifier or inhale steam from a shower to increase air moisture. This may keep secretions moist and make it easier to breathe.  Drink enough water and fluids to keep your urine clear or pale yellow.  Rest as needed.  Return to work when your temperature has returned to normal or as your caregiver advises. You may need to stay home longer to avoid infecting others. You can also use a face mask and careful hand washing to prevent spread of the virus. SEEK MEDICAL CARE IF:   After the first few days, you feel you are getting worse rather than better.  You need your caregiver's advice about medicines to control symptoms.  You develop chills, worsening shortness of breath, or brown or red sputum. These may be signs of pneumonia.  You develop yellow or brown nasal discharge or pain in the face, especially when you bend forward. These may be signs of sinusitis.  You develop a fever, swollen neck glands, pain with swallowing, or white areas in the back of your throat. These may be signs of strep throat. SEEK IMMEDIATE MEDICAL CARE IF:   You have a fever.  You develop severe or persistent headache, ear pain, sinus pain, or chest pain.  You develop wheezing, a prolonged cough, cough up blood, or have a change in your usual mucus (if you have chronic lung disease).  You develop sore muscles or a stiff neck. Document Released: 08/10/2000 Document Revised: 05/09/2011 Document Reviewed: 05/22/2013 [employer] Patient Information 2015 [employer]. This information is not intended to replace advice given to you by your health care provider. Make sure you discuss any questions you have with your health care provider.  

## 2013-12-10 NOTE — ED Notes (Signed)
2 weeks: productive cough (yellow/green), no fevers, no pleurisy.  Woke up with sore throat this morning.

## 2013-12-10 NOTE — ED Provider Notes (Signed)
CSN: [id number]     Arrival date & time 12/10/13  1011 History   First MD Initiated Contact with Patient 12/10/13 1024     Chief Complaint  Patient presents with  . Cough  . Nasal Congestion  . Sore Throat     (Consider location/radiation/quality/duration/timing/severity/associated sxs/prior Treatment) Patient is a 53 y.o. female presenting with cough and pharyngitis. The history is provided by the patient. No language interpreter was used.  Cough Cough characteristics:  Productive Sputum characteristics:  Clear and nondescript Severity:  Moderate Onset quality:  Gradual Duration:  2 weeks Timing:  Constant Chronicity:  New Smoker: no   Associated symptoms: sore throat   Associated symptoms: no chest pain, no chills, no fever, no myalgias and no rash   Associated symptoms comment:  Cough and chest congestion for the past 2 weeks with onset sore throat yesterday. No significant nasal congestion. No bloody sputum from cough. She denies known fever.  Sore Throat Associated symptoms include coughing and a sore throat. Pertinent negatives include no chest pain, chills, congestion, fever, myalgias, nausea, rash or vomiting.    Past Medical History  Diagnosis Date  . 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  [place] HD in [place] in 2006. [place] moved to [place] and [place] [facility] [place].  [place] [facility] [place] and [facility] on [address] on a TTS schedule.  HD access is right forearm AVF.  She had a failed attempt at L arm access in the past that didn't mature.   [name]. HIV disease 09/29/2012    Diagnosed on 2000, on HIV meds.  On ART, viral levels undetectable per pt.     [name]. 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    . Hypertension 09/29/2012   Past Surgical History   Procedure Laterality Date  . Hernia repair    . Cesarean section     Family History  Problem Relation Age of Onset  . Diabetes Mother   . Hypertension Mother   . Hypertension Sister   . Diabetes Sister    History  Substance Use Topics  . Smoking status: Never Smoker   . Smokeless tobacco: Never Used  . Alcohol Use: No   OB History   Grav Para Term Preterm Abortions TAB SAB Ect Mult Living                 Review of Systems  Constitutional: Negative for fever and chills.  HENT: Positive for sore throat. Negative for congestion and trouble swallowing.   Respiratory: Positive for cough and chest tightness.   Cardiovascular: Negative.  Negative for chest pain.  Gastrointestinal: Negative.  Negative for nausea and vomiting.  Musculoskeletal: Negative.  Negative for myalgias.  Skin: Negative.  Negative for rash.  Neurological: Negative.       Allergies  Benadryl and Latex  Home Medications   Prior to Admission medications   Medication Sig Start Date End Date Taking? Authorizing Provider  abacavir (ZIAGEN) 300 MG tablet Take 1 tablet (300 mg total) by mouth 2 (two) times daily. 03/12/13   Quentin Angstlugbemiga E Jegede, MD  amLODipine (NORVASC) 10 MG tablet Take 10 mg by mouth daily.    Historical Provider, MD  benzonatate (TESSALON PERLES) 100 MG capsule Take 1  capsule (100 mg total) by mouth 3 (three) times daily as needed for cough. 11/17/12   Penny Piarlando Vega, MD  cinacalcet (SENSIPAR) 30 MG tablet Take 30 mg by mouth at bedtime.    Historical Provider, MD  etravirine (INTELENCE) 100 MG tablet Take 2 tablets (200 mg total) by mouth 2 (two) times daily with a meal. 03/12/13   Olugbemiga E Hyman HopesJegede, MD  isosorbide-hydrALAZINE (BIDIL) 20-37.5 MG per tablet Take 1 tablet by mouth 2 (two) times daily. 08/16/13   Vassie Lollarlos Madera, MD  metoprolol succinate (TOPROL-XL) 25 MG 24 hr tablet Take 25 mg by mouth daily.    Historical Provider, MD  multivitamin (RENA-VIT) TABS tablet Take 1 tablet by mouth  daily. 10/02/12   Zetta BillsJay Patel, MD  Nutritional Supplements (FEEDING SUPPLEMENT, NEPRO CARB STEADY,) LIQD Take 237 mLs by mouth daily. 08/16/13   Vassie Lollarlos Madera, MD  ondansetron (ZOFRAN) 4 MG tablet Take 1 tablet (4 mg total) by mouth every 8 (eight) hours as needed for nausea or vomiting. 08/12/13   Ambrose FinlandValerie A Keck, NP  pantoprazole (PROTONIX) 40 MG tablet Take 1 tablet (40 mg total) by mouth daily. 08/16/13   Vassie Lollarlos Madera, MD  raltegravir (ISENTRESS) 400 MG tablet Take 400 mg by mouth 2 (two) times daily.    Historical Provider, MD  sertraline (ZOLOFT) 25 MG tablet Take 50 mg by mouth daily.     Historical Provider, MD  temazepam (RESTORIL) 15 MG capsule Take 15 mg by mouth at bedtime as needed for sleep.    Historical Provider, MD  tenofovir (VIREAD) 300 MG tablet Take 300 mg by mouth every 7 (seven) days. Patient takes on saturday 03/12/13   Quentin Angstlugbemiga E Jegede, MD  tetrahydrozoline 0.05 % ophthalmic solution Place 2 drops into both eyes daily as needed (for dry eyes).    Historical Provider, MD   BP 179/108  Temp(Src) 97.6 F (36.4 C) (Oral)  Resp 18  SpO2 100% Physical Exam  Constitutional: She is oriented to person, place, and time. She appears well-developed and well-nourished.  HENT:  Head: Normocephalic.  Nose: Mucosal edema present.  Mouth/Throat: Mucous membranes are normal. Posterior oropharyngeal erythema present. No oropharyngeal exudate or posterior oropharyngeal edema.  Eyes: Conjunctivae are normal.  Neck: Normal range of motion. Neck supple.  Cardiovascular: Normal rate and regular rhythm.   Pulmonary/Chest: Effort normal and breath sounds normal. She has no wheezes. She has no rales.  Abdominal: Soft. Bowel sounds are normal. There is no tenderness. There is no rebound and no guarding.  Musculoskeletal: Normal range of motion. She exhibits no edema.  Neurological: She is alert and oriented to person, place, and time.  Skin: Skin is warm and dry. No rash noted.  Psychiatric: She  has a normal mood and affect.    ED Course  Procedures (including critical care time) Labs Review Labs Reviewed - No data to display  Imaging Review No results found.   EKG Interpretation None      MDM   Final diagnoses:  None    1. URI  Patient is a dialysis patient who reports compliance with all treatments. No SOB, she does not feel current symptoms related to ESRD. No edema. Duration of symptoms for 2 weeks - will treat with abx and encourage recheck with her doctor on Thursday at next dialysis appointment.    Arnoldo HookerShari A Veron Senner, PA-C 12/10/13 1109

## 2013-12-11 ENCOUNTER — Encounter (HOSPITAL_COMMUNITY): Payer: Self-pay | Admitting: Emergency Medicine

## 2013-12-11 ENCOUNTER — Inpatient Hospital Stay (HOSPITAL_COMMUNITY)
Admission: EM | Admit: 2013-12-11 | Discharge: 2013-12-14 | DRG: 286 | Disposition: A | Discharge status: Home / Self Care | Payer: Medicare Other | Attending: Internal Medicine | Admitting: Internal Medicine

## 2013-12-11 ENCOUNTER — Encounter: Payer: Self-pay | Admitting: Vascular Surgery

## 2013-12-11 ENCOUNTER — Emergency Department (HOSPITAL_COMMUNITY): Payer: Medicare Other

## 2013-12-11 DIAGNOSIS — D631 Anemia in chronic kidney disease: Secondary | ICD-10-CM

## 2013-12-11 DIAGNOSIS — Z9115 Patient's noncompliance with renal dialysis: Secondary | ICD-10-CM

## 2013-12-11 DIAGNOSIS — I5043 Acute on chronic combined systolic (congestive) and diastolic (congestive) heart failure: Principal | ICD-10-CM | POA: Diagnosis present

## 2013-12-11 DIAGNOSIS — R7989 Other specified abnormal findings of blood chemistry: Secondary | ICD-10-CM

## 2013-12-11 DIAGNOSIS — N19 Unspecified kidney failure: Secondary | ICD-10-CM

## 2013-12-11 DIAGNOSIS — I120 Hypertensive chronic kidney disease with stage 5 chronic kidney disease or end stage renal disease: Secondary | ICD-10-CM | POA: Diagnosis present

## 2013-12-11 DIAGNOSIS — Z21 Asymptomatic human immunodeficiency virus [HIV] infection status: Secondary | ICD-10-CM | POA: Diagnosis present

## 2013-12-11 DIAGNOSIS — E8779 Other fluid overload: Secondary | ICD-10-CM

## 2013-12-11 DIAGNOSIS — E875 Hyperkalemia: Secondary | ICD-10-CM

## 2013-12-11 DIAGNOSIS — E8770 Fluid overload, unspecified: Secondary | ICD-10-CM | POA: Diagnosis present

## 2013-12-11 DIAGNOSIS — T8249XA Other complication of vascular dialysis catheter, initial encounter: Secondary | ICD-10-CM

## 2013-12-11 DIAGNOSIS — J9601 Acute respiratory failure with hypoxia: Secondary | ICD-10-CM

## 2013-12-11 DIAGNOSIS — Z8661 Personal history of infections of the central nervous system: Secondary | ICD-10-CM

## 2013-12-11 DIAGNOSIS — J029 Acute pharyngitis, unspecified: Secondary | ICD-10-CM | POA: Diagnosis not present

## 2013-12-11 DIAGNOSIS — Z9119 Patient's noncompliance with other medical treatment and regimen: Secondary | ICD-10-CM | POA: Diagnosis present

## 2013-12-11 DIAGNOSIS — R748 Abnormal levels of other serum enzymes: Secondary | ICD-10-CM | POA: Diagnosis present

## 2013-12-11 DIAGNOSIS — E43 Unspecified severe protein-calorie malnutrition: Secondary | ICD-10-CM

## 2013-12-11 DIAGNOSIS — N189 Chronic kidney disease, unspecified: Secondary | ICD-10-CM

## 2013-12-11 DIAGNOSIS — Z992 Dependence on renal dialysis: Secondary | ICD-10-CM

## 2013-12-11 DIAGNOSIS — D649 Anemia, unspecified: Secondary | ICD-10-CM | POA: Diagnosis present

## 2013-12-11 DIAGNOSIS — J810 Acute pulmonary edema: Secondary | ICD-10-CM

## 2013-12-11 DIAGNOSIS — Z91158 Patient's noncompliance with renal dialysis for other reason: Secondary | ICD-10-CM

## 2013-12-11 DIAGNOSIS — N2581 Secondary hyperparathyroidism of renal origin: Secondary | ICD-10-CM | POA: Diagnosis present

## 2013-12-11 DIAGNOSIS — I10 Essential (primary) hypertension: Secondary | ICD-10-CM

## 2013-12-11 DIAGNOSIS — I5023 Acute on chronic systolic (congestive) heart failure: Secondary | ICD-10-CM

## 2013-12-11 DIAGNOSIS — B20 Human immunodeficiency virus [HIV] disease: Secondary | ICD-10-CM

## 2013-12-11 DIAGNOSIS — N186 End stage renal disease: Secondary | ICD-10-CM

## 2013-12-11 DIAGNOSIS — R778 Other specified abnormalities of plasma proteins: Secondary | ICD-10-CM

## 2013-12-11 DIAGNOSIS — Y832 Surgical operation with anastomosis, bypass or graft as the cause of abnormal reaction of the patient, or of later complication, without mention of misadventure at the time of the procedure: Secondary | ICD-10-CM | POA: Diagnosis present

## 2013-12-11 HISTORY — DX: Anemia, unspecified: D64.9

## 2013-12-11 HISTORY — DX: Personal history of other medical treatment: Z92.89

## 2013-12-11 HISTORY — DX: Cerebral infarction, unspecified: I63.9

## 2013-12-11 HISTORY — DX: Pneumonia, unspecified organism: J18.9

## 2013-12-11 HISTORY — DX: Unspecified convulsions: R56.9

## 2013-12-11 LAB — RENAL FUNCTION PANEL
ALBUMIN: 3 g/dL — AB (ref 3.5–5.2)
ANION GAP: 24 — AB (ref 5–15)
BUN: 62 mg/dL — ABNORMAL HIGH (ref 6–23)
CHLORIDE: 95 meq/L — AB (ref 96–112)
CO2: 20 meq/L (ref 19–32)
Calcium: 9.5 mg/dL (ref 8.4–10.5)
Creatinine, Ser: 11.44 mg/dL — ABNORMAL HIGH (ref 0.50–1.10)
GFR, EST AFRICAN AMERICAN: 4 mL/min — AB (ref >90)
GFR, EST NON AFRICAN AMERICAN: 3 mL/min — AB (ref >90)
Glucose, Bld: 93 mg/dL (ref 70–99)
POTASSIUM: 6.2 meq/L — AB (ref 3.7–5.3)
Phosphorus: 8.8 mg/dL — ABNORMAL HIGH (ref 2.3–4.6)
SODIUM: 139 meq/L (ref 137–147)

## 2013-12-11 LAB — I-STAT VENOUS BLOOD GAS, ED
ACID-BASE EXCESS: 2 mmol/L (ref 0.0–2.0)
BICARBONATE: 25.9 meq/L — AB (ref 20.0–24.0)
O2 Saturation: 84 %
PO2 VEN: 47 mmHg — AB (ref 30.0–45.0)
TCO2: 27 mmol/L (ref 0–100)
pCO2, Ven: 38.3 mmHg — ABNORMAL LOW (ref 45.0–50.0)
pH, Ven: 7.438 — ABNORMAL HIGH (ref 7.250–7.300)

## 2013-12-11 LAB — CBC
HCT: 25 % — ABNORMAL LOW (ref 36.0–46.0)
HCT: 23.7 % — ABNORMAL LOW (ref 36.0–46.0)
HEMOGLOBIN: 7.9 g/dL — AB (ref 12.0–15.0)
Hemoglobin: 7.4 g/dL — ABNORMAL LOW (ref 12.0–15.0)
MCH: 28 pg (ref 26.0–34.0)
MCH: 28.6 pg (ref 26.0–34.0)
MCHC: 31.6 g/dL (ref 30.0–36.0)
MCHC: 31.2 g/dL (ref 30.0–36.0)
MCV: 88.7 fL (ref 78.0–100.0)
MCV: 91.5 fL (ref 78.0–100.0)
PLATELETS: 137 10*3/uL — AB (ref 150–400)
Platelets: 140 10*3/uL — ABNORMAL LOW (ref 150–400)
RBC: 2.82 MIL/uL — AB (ref 3.87–5.11)
RBC: 2.59 MIL/uL — ABNORMAL LOW (ref 3.87–5.11)
RDW: 19.7 % — ABNORMAL HIGH (ref 11.5–15.5)
RDW: 19.8 % — AB (ref 11.5–15.5)
WBC: 4.5 10*3/uL (ref 4.0–10.5)
WBC: 3.8 10*3/uL — AB (ref 4.0–10.5)

## 2013-12-11 LAB — CBC WITH DIFFERENTIAL/PLATELET
BASOS PCT: 0 % (ref 0–1)
Basophils Absolute: 0 10*3/uL (ref 0.0–0.1)
EOS ABS: 0.3 10*3/uL (ref 0.0–0.7)
Eosinophils Relative: 6 % — ABNORMAL HIGH (ref 0–5)
HCT: 29.8 % — ABNORMAL LOW (ref 36.0–46.0)
HEMOGLOBIN: 9.2 g/dL — AB (ref 12.0–15.0)
Lymphocytes Relative: 10 % — ABNORMAL LOW (ref 12–46)
Lymphs Abs: 0.5 10*3/uL — ABNORMAL LOW (ref 0.7–4.0)
MCH: 27.8 pg (ref 26.0–34.0)
MCHC: 30.9 g/dL (ref 30.0–36.0)
MCV: 90 fL (ref 78.0–100.0)
MONOS PCT: 6 % (ref 3–12)
Monocytes Absolute: 0.3 10*3/uL (ref 0.1–1.0)
Neutro Abs: 4.1 10*3/uL (ref 1.7–7.7)
Neutrophils Relative %: 78 % — ABNORMAL HIGH (ref 43–77)
PLATELETS: 198 10*3/uL (ref 150–400)
RBC: 3.31 MIL/uL — ABNORMAL LOW (ref 3.87–5.11)
RDW: 19.9 % — ABNORMAL HIGH (ref 11.5–15.5)
WBC: 5.2 10*3/uL (ref 4.0–10.5)

## 2013-12-11 LAB — PROTIME-INR
INR: 0.97 (ref 0.00–1.49)
Prothrombin Time: 12.9 seconds (ref 11.6–15.2)

## 2013-12-11 LAB — BASIC METABOLIC PANEL
ANION GAP: 23 — AB (ref 5–15)
BUN: 57 mg/dL — AB (ref 6–23)
CO2: 23 mEq/L (ref 19–32)
Calcium: 10.1 mg/dL (ref 8.4–10.5)
Chloride: 95 mEq/L — ABNORMAL LOW (ref 96–112)
Creatinine, Ser: 11.11 mg/dL — ABNORMAL HIGH (ref 0.50–1.10)
GFR calc Af Amer: 4 mL/min — ABNORMAL LOW (ref >90)
GFR, EST NON AFRICAN AMERICAN: 3 mL/min — AB (ref >90)
Glucose, Bld: 106 mg/dL — ABNORMAL HIGH (ref 70–99)
Potassium: 5.7 mEq/L — ABNORMAL HIGH (ref 3.7–5.3)
Sodium: 141 mEq/L (ref 137–147)

## 2013-12-11 LAB — I-STAT CHEM 8, ED
BUN: 53 mg/dL — ABNORMAL HIGH (ref 6–23)
CHLORIDE: 102 meq/L (ref 96–112)
Calcium, Ion: 1.12 mmol/L (ref 1.12–1.23)
Creatinine, Ser: 11.2 mg/dL — ABNORMAL HIGH (ref 0.50–1.10)
Glucose, Bld: 109 mg/dL — ABNORMAL HIGH (ref 70–99)
HEMATOCRIT: 32 % — AB (ref 36.0–46.0)
HEMOGLOBIN: 10.9 g/dL — AB (ref 12.0–15.0)
POTASSIUM: 5.4 meq/L — AB (ref 3.7–5.3)
SODIUM: 138 meq/L (ref 137–147)
TCO2: 26 mmol/L (ref 0–100)

## 2013-12-11 LAB — I-STAT CG4 LACTIC ACID, ED: Lactic Acid, Venous: 1.32 mmol/L (ref 0.5–2.2)

## 2013-12-11 LAB — I-STAT TROPONIN, ED: Troponin i, poc: 0.34 ng/mL (ref 0.00–0.08)

## 2013-12-11 LAB — TROPONIN I
TROPONIN I: 0.41 ng/mL — AB (ref <0.30)
TROPONIN I: 0.35 ng/mL — AB (ref <0.30)
Troponin I: 0.43 ng/mL (ref <0.30)

## 2013-12-11 LAB — TSH: TSH: 5.09 u[IU]/mL — AB (ref 0.350–4.500)

## 2013-12-11 LAB — CREATININE, SERUM
CREATININE: 11.52 mg/dL — AB (ref 0.50–1.10)
GFR calc Af Amer: 4 mL/min — ABNORMAL LOW (ref >90)
GFR calc non Af Amer: 3 mL/min — ABNORMAL LOW (ref >90)

## 2013-12-11 LAB — HEPATITIS B SURFACE ANTIGEN: Hepatitis B Surface Ag: NEGATIVE

## 2013-12-11 MED ORDER — ALTEPLASE 2 MG IJ SOLR
2.0000 mg | Freq: Once | INTRAMUSCULAR | Status: DC | PRN
  Filled 2013-12-11: qty 2

## 2013-12-11 MED ORDER — HYDRALAZINE HCL 20 MG/ML IJ SOLN: 10.0000 mg | INTRAMUSCULAR | Status: DC | PRN

## 2013-12-11 MED ORDER — SODIUM CHLORIDE 0.9 % IJ SOLN: 3.0000 mL | INTRAMUSCULAR | Status: DC | PRN

## 2013-12-11 MED ORDER — TENOFOVIR DISOPROXIL FUMARATE 300 MG PO TABS
300.0000 mg | ORAL_TABLET | ORAL | Status: DC
  Administered 2013-12-14: 300 mg via ORAL
  Filled 2013-12-11: qty 1

## 2013-12-11 MED ORDER — LIDOCAINE HCL (PF) 1 % IJ SOLN: 5.0000 mL | INTRAMUSCULAR | Status: DC | PRN

## 2013-12-11 MED ORDER — METOPROLOL SUCCINATE ER 25 MG PO TB24
25.0000 mg | ORAL_TABLET | Freq: Every day | ORAL | Status: DC
  Administered 2013-12-11: 25 mg via ORAL
  Filled 2013-12-11 (×2): qty 1

## 2013-12-11 MED ORDER — AMLODIPINE BESYLATE 10 MG PO TABS
10.0000 mg | ORAL_TABLET | Freq: Every day | ORAL | Status: DC
  Administered 2013-12-11: 10 mg via ORAL
  Filled 2013-12-11 (×2): qty 1

## 2013-12-11 MED ORDER — ISOSORB DINITRATE-HYDRALAZINE 20-37.5 MG PO TABS
1.0000 | ORAL_TABLET | Freq: Once | ORAL | Status: AC
  Administered 2013-12-11: 1 via ORAL
  Filled 2013-12-11: qty 1

## 2013-12-11 MED ORDER — HEPARIN SODIUM (PORCINE) 1000 UNIT/ML DIALYSIS: 20.0000 [IU]/kg | INTRAMUSCULAR | Status: DC | PRN

## 2013-12-11 MED ORDER — NEPRO/CARBSTEADY PO LIQD: 237.0000 mL | ORAL | Status: DC | PRN

## 2013-12-11 MED ORDER — NITROGLYCERIN 0.4 MG SL SUBL
0.4000 mg | SUBLINGUAL_TABLET | SUBLINGUAL | Status: DC | PRN
  Administered 2013-12-11 (×2): 0.4 mg via SUBLINGUAL
  Filled 2013-12-11: qty 1

## 2013-12-11 MED ORDER — ALUM & MAG HYDROXIDE-SIMETH 200-200-20 MG/5ML PO SUSP: 30.0000 mL | Freq: Four times a day (QID) | ORAL | Status: DC | PRN

## 2013-12-11 MED ORDER — HEPARIN SODIUM (PORCINE) 5000 UNIT/ML IJ SOLN
5000.0000 [IU] | Freq: Three times a day (TID) | INTRAMUSCULAR | Status: DC
  Administered 2013-12-11 – 2013-12-13 (×4): 5000 [IU] via SUBCUTANEOUS
  Filled 2013-12-11 (×11): qty 1

## 2013-12-11 MED ORDER — SODIUM CHLORIDE 0.9 % IV SOLN: 100.0000 mL | INTRAVENOUS | Status: DC | PRN

## 2013-12-11 MED ORDER — NEPRO/CARBSTEADY PO LIQD
237.0000 mL | Freq: Two times a day (BID) | ORAL | Status: DC
  Administered 2013-12-14 (×2): 237 mL via ORAL
  Filled 2013-12-11: qty 237

## 2013-12-11 MED ORDER — SODIUM CHLORIDE 0.9 % IV SOLN
100.0000 mL | INTRAVENOUS | Status: DC | PRN
Start: 2013-12-11 — End: 2013-12-11

## 2013-12-11 MED ORDER — ETRAVIRINE 100 MG PO TABS
200.0000 mg | ORAL_TABLET | Freq: Two times a day (BID) | ORAL | Status: DC
  Administered 2013-12-11 – 2013-12-14 (×5): 200 mg via ORAL
  Filled 2013-12-11 (×9): qty 2

## 2013-12-11 MED ORDER — MORPHINE SULFATE 2 MG/ML IJ SOLN
2.0000 mg | INTRAMUSCULAR | Status: DC | PRN
  Administered 2013-12-14: 2 mg via INTRAVENOUS
  Filled 2013-12-11: qty 1

## 2013-12-11 MED ORDER — ONDANSETRON HCL 4 MG PO TABS: 4.0000 mg | ORAL_TABLET | Freq: Four times a day (QID) | ORAL | Status: DC | PRN

## 2013-12-11 MED ORDER — AMLODIPINE BESYLATE 5 MG PO TABS
10.0000 mg | ORAL_TABLET | Freq: Once | ORAL | Status: AC
  Administered 2013-12-11: 10 mg via ORAL
  Filled 2013-12-11: qty 2

## 2013-12-11 MED ORDER — MENTHOL 3 MG MT LOZG
1.0000 | LOZENGE | OROMUCOSAL | Status: DC | PRN
  Filled 2013-12-11 (×2): qty 9

## 2013-12-11 MED ORDER — PENTAFLUOROPROP-TETRAFLUOROETH EX AERO: 1.0000 "application " | INHALATION_SPRAY | CUTANEOUS | Status: DC | PRN

## 2013-12-11 MED ORDER — LIDOCAINE-PRILOCAINE 2.5-2.5 % EX CREA: 1.0000 "application " | TOPICAL_CREAM | CUTANEOUS | Status: DC | PRN

## 2013-12-11 MED ORDER — METOPROLOL SUCCINATE ER 25 MG PO TB24
25.0000 mg | ORAL_TABLET | Freq: Every day | ORAL | Status: DC
  Administered 2013-12-11 – 2013-12-14 (×4): 25 mg via ORAL
  Filled 2013-12-11 (×4): qty 1

## 2013-12-11 MED ORDER — ONDANSETRON HCL 4 MG/2ML IJ SOLN
4.0000 mg | Freq: Four times a day (QID) | INTRAMUSCULAR | Status: DC | PRN
  Administered 2013-12-11 – 2013-12-14 (×2): 4 mg via INTRAVENOUS
  Filled 2013-12-11 (×2): qty 2

## 2013-12-11 MED ORDER — PANTOPRAZOLE SODIUM 40 MG PO TBEC
40.0000 mg | DELAYED_RELEASE_TABLET | Freq: Every day | ORAL | Status: DC
  Administered 2013-12-11 – 2013-12-14 (×4): 40 mg via ORAL
  Filled 2013-12-11 (×2): qty 1

## 2013-12-11 MED ORDER — ACETAMINOPHEN 325 MG PO TABS
650.0000 mg | ORAL_TABLET | Freq: Four times a day (QID) | ORAL | Status: DC | PRN
  Administered 2013-12-12: 650 mg via ORAL

## 2013-12-11 MED ORDER — ABACAVIR SULFATE 300 MG PO TABS
300.0000 mg | ORAL_TABLET | Freq: Two times a day (BID) | ORAL | Status: DC
  Administered 2013-12-11 – 2013-12-14 (×5): 300 mg via ORAL
  Filled 2013-12-11 (×8): qty 1

## 2013-12-11 MED ORDER — NEPRO/CARBSTEADY PO LIQD
237.0000 mL | Freq: Every day | ORAL | Status: DC
  Administered 2013-12-11: 237 mL via ORAL

## 2013-12-11 MED ORDER — SERTRALINE HCL 50 MG PO TABS
50.0000 mg | ORAL_TABLET | Freq: Every day | ORAL | Status: DC
  Administered 2013-12-11 – 2013-12-14 (×4): 50 mg via ORAL
  Filled 2013-12-11 (×4): qty 1

## 2013-12-11 MED ORDER — ACETAMINOPHEN 650 MG RE SUPP: 650.0000 mg | Freq: Four times a day (QID) | RECTAL | Status: DC | PRN

## 2013-12-11 MED ORDER — CINACALCET HCL 30 MG PO TABS
30.0000 mg | ORAL_TABLET | Freq: Every day | ORAL | Status: DC
  Administered 2013-12-11 – 2013-12-13 (×3): 30 mg via ORAL
  Filled 2013-12-11 (×4): qty 1

## 2013-12-11 MED ORDER — ISOSORB DINITRATE-HYDRALAZINE 20-37.5 MG PO TABS
1.0000 | ORAL_TABLET | Freq: Two times a day (BID) | ORAL | Status: DC
  Administered 2013-12-11 – 2013-12-13 (×5): 1 via ORAL
  Filled 2013-12-11 (×8): qty 1

## 2013-12-11 MED ORDER — OXYCODONE HCL 5 MG PO TABS
5.0000 mg | ORAL_TABLET | ORAL | Status: DC | PRN
  Administered 2013-12-11 – 2013-12-14 (×3): 5 mg via ORAL
  Filled 2013-12-11 (×3): qty 1

## 2013-12-11 MED ORDER — DARBEPOETIN ALFA-POLYSORBATE 200 MCG/0.4ML IJ SOLN
200.0000 ug | INTRAMUSCULAR | Status: DC
  Administered 2013-12-11: 200 ug via INTRAVENOUS
  Filled 2013-12-11: qty 0.4

## 2013-12-11 MED ORDER — SODIUM CHLORIDE 0.9 % IV SOLN
62.5000 mg | INTRAVENOUS | Status: DC
  Administered 2013-12-11: 62.5 mg via INTRAVENOUS
  Filled 2013-12-11 (×2): qty 5

## 2013-12-11 MED ORDER — DARBEPOETIN ALFA-POLYSORBATE 200 MCG/0.4ML IJ SOLN
INTRAMUSCULAR | Status: AC
  Administered 2013-12-11: 200 ug via INTRAVENOUS
  Filled 2013-12-11: qty 0.4

## 2013-12-11 MED ORDER — TEMAZEPAM 15 MG PO CAPS: 15.0000 mg | ORAL_CAPSULE | Freq: Every evening | ORAL | Status: DC | PRN

## 2013-12-11 MED ORDER — DOXERCALCIFEROL 4 MCG/2ML IV SOLN
INTRAVENOUS | Status: AC
  Administered 2013-12-11: 7 ug via INTRAVENOUS
  Filled 2013-12-11: qty 4

## 2013-12-11 MED ORDER — RALTEGRAVIR POTASSIUM 400 MG PO TABS
400.0000 mg | ORAL_TABLET | Freq: Two times a day (BID) | ORAL | Status: DC
  Administered 2013-12-11 – 2013-12-14 (×5): 400 mg via ORAL
  Filled 2013-12-11 (×8): qty 1

## 2013-12-11 MED ORDER — SODIUM CHLORIDE 0.9 % IJ SOLN
3.0000 mL | Freq: Two times a day (BID) | INTRAMUSCULAR | Status: DC
  Administered 2013-12-11 – 2013-12-14 (×6): 3 mL via INTRAVENOUS

## 2013-12-11 MED ORDER — ACETAMINOPHEN 500 MG PO TABS
1000.0000 mg | ORAL_TABLET | Freq: Once | ORAL | Status: AC
  Administered 2013-12-11: 1000 mg via ORAL
  Filled 2013-12-11: qty 2

## 2013-12-11 MED ORDER — DOXERCALCIFEROL 4 MCG/2ML IV SOLN
7.0000 ug | Freq: Once | INTRAVENOUS | Status: AC
  Administered 2013-12-11: 7 ug via INTRAVENOUS
  Filled 2013-12-11: qty 4

## 2013-12-11 MED ORDER — BENZONATATE 100 MG PO CAPS
100.0000 mg | ORAL_CAPSULE | Freq: Three times a day (TID) | ORAL | Status: DC | PRN
  Administered 2013-12-11 – 2013-12-12 (×3): 100 mg via ORAL
  Filled 2013-12-11 (×4): qty 1

## 2013-12-11 MED ORDER — HEPARIN SODIUM (PORCINE) 1000 UNIT/ML DIALYSIS
1000.0000 [IU] | INTRAMUSCULAR | Status: DC | PRN
  Filled 2013-12-11: qty 1

## 2013-12-11 MED ORDER — SODIUM CHLORIDE 0.9 % IV SOLN: 250.0000 mL | INTRAVENOUS | Status: DC | PRN

## 2013-12-11 MED ORDER — HEPARIN SODIUM (PORCINE) 1000 UNIT/ML DIALYSIS
3000.0000 [IU] | Freq: Once | INTRAMUSCULAR | Status: AC
  Administered 2013-12-11: 3000 [IU] via INTRAVENOUS_CENTRAL
  Filled 2013-12-11: qty 3

## 2013-12-11 NOTE — Consult Note (Signed)
[facility] Renal Consultation Note  Indication for Consultation:  Management of ESRD/hemodialysis; anemia, hypertension/volume and secondary hyperparathyroidism  HPI: Bethany [name] is a 53 y.o. female with a history of hypertension, HIV, and ESRD on dialysis at the [facility] who presented to the ER yesterday with two weeks of worsening cough, productive with yellow sputum, now with mild dyspnea, nausea and sore throat.  She denies fever, chills, vomiting, or diarrhea.  She is generally noncompliant with dialysis and has had only three treatments in October--10/1, 10/7, and 10/10, when she ran only 2:43.  Chest x-ray today suggests CHF, but no obvious infiltrate, so she will receive dialysis today.  She says she already feels better with minimal intervention - O2  Dialysis Orders:  TTS @ [facility] 4 hrs    46.5 kg    2K/2Ca      450/A1.5     Heparin 3000 U      AVG @ RFA  Hectorol 7 mcg         Aranesp 200 mcg & Venofer 50 mg   Past Medical History  Diagnosis Date  . 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  Started HD in [place] in 2006. She moved to [place] and received dialysis with [facility] in [facility] starting in 2009.  Moved to [facility] in August 2014 and now gets dialysis at [facility] on [address] on a TTS schedule.  HD access is right forearm AVF.  She had a failed attempt at L arm access in the past that didn't mature.   [name] HIV disease 09/29/2012    Diagnosed on 2000, on HIV meds.  On ART, viral levels undetectable per pt.     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    . Hypertension 09/29/2012   Past Surgical History  Procedure Laterality Date  . Hernia repair    . Cesarean section     Family History  Problem Relation Age of Onset  . Diabetes Mother   . Hypertension Mother   . Hypertension Sister    . Diabetes Sister    Social History  She denies any history of tobacco or illicit drugs and no longer drinks alcohol.  Allergies  Allergen Reactions  . Benadryl [Diphenhydramine Hcl] Other (See Comments)    Makes patient jittery  . Latex Hives and Itching   Prior to Admission medications   Medication Sig Start Date End Date Taking? Authorizing Provider  abacavir (ZIAGEN) 300 MG tablet Take 1 tablet (300 mg total) by mouth 2 (two) times daily. 03/12/13  Yes Tresa Garter, MD  amLODipine (NORVASC) 10 MG tablet Take 10 mg by mouth daily.   Yes Historical Provider, MD  azithromycin (ZITHROMAX) 250 MG tablet Take 1 tablet (250 mg total) by mouth daily. Take first 2 tablets together, then 1 every day until finished. 12/10/13  Yes Shari A Upstill, PA-C  benzonatate (TESSALON PERLES) 100 MG capsule Take 1 capsule (100 mg total) by mouth 3 (three) times daily as needed for cough. 11/17/12  Yes Velvet Bathe, MD  cinacalcet (SENSIPAR) 30 MG tablet Take 30 mg by mouth at bedtime.   Yes Historical Provider, MD  etravirine (INTELENCE) 100 MG tablet Take 2 tablets (200 mg total) by mouth 2 (two) times daily with a meal. 03/12/13  Yes  Tresa Garter, MD  isosorbide-hydrALAZINE (BIDIL) 20-37.5 MG per tablet Take 1 tablet by mouth 2 (two) times daily. 08/16/13  Yes Barton Dubois, MD  metoprolol succinate (TOPROL-XL) 25 MG 24 hr tablet Take 25 mg by mouth daily.   Yes Historical Provider, MD  multivitamin (RENA-VIT) TABS tablet Take 1 tablet by mouth daily. 10/02/12  Yes Elmarie Shiley, MD  Nutritional Supplements (FEEDING SUPPLEMENT, NEPRO CARB STEADY,) LIQD Take 237 mLs by mouth daily. 08/16/13  Yes Barton Dubois, MD  ondansetron (ZOFRAN) 4 MG tablet Take 1 tablet (4 mg total) by mouth every 8 (eight) hours as needed for nausea or vomiting. 08/12/13  Yes Lance Bosch, NP  pantoprazole (PROTONIX) 40 MG tablet Take 1 tablet (40 mg total) by mouth daily. 08/16/13  Yes Barton Dubois, MD  raltegravir (ISENTRESS)  400 MG tablet Take 400 mg by mouth 2 (two) times daily.   Yes Historical Provider, MD  sertraline (ZOLOFT) 25 MG tablet Take 50 mg by mouth daily.    Yes Historical Provider, MD  temazepam (RESTORIL) 15 MG capsule Take 15 mg by mouth at bedtime as needed for sleep.   Yes Historical Provider, MD  tenofovir (VIREAD) 300 MG tablet Take 300 mg by mouth every 7 (seven) days. Patient takes on saturday 03/12/13  Yes Tresa Garter, MD  tetrahydrozoline 0.05 % ophthalmic solution Place 2 drops into both eyes daily as needed (for dry eyes).   Yes Historical Provider, MD   Labs:  Results for orders placed during the hospital encounter of 12/11/13 (from the past 48 hour(s))  CBC WITH DIFFERENTIAL     Status: Abnormal   Collection Time    12/11/13  4:37 AM      Result Value Ref Range   WBC 5.2  4.0 - 10.5 K/uL   RBC 3.31 (*) 3.87 - 5.11 MIL/uL   Hemoglobin 9.2 (*) 12.0 - 15.0 g/dL   HCT 29.8 (*) 36.0 - 46.0 %   MCV 90.0  78.0 - 100.0 fL   MCH 27.8  26.0 - 34.0 pg   MCHC 30.9  30.0 - 36.0 g/dL   RDW 19.9 (*) 11.5 - 15.5 %   Platelets 198  150 - 400 K/uL   Neutrophils Relative % 78 (*) 43 - 77 %   Neutro Abs 4.1  1.7 - 7.7 K/uL   Lymphocytes Relative 10 (*) 12 - 46 %   Lymphs Abs 0.5 (*) 0.7 - 4.0 K/uL   Monocytes Relative 6  3 - 12 %   Monocytes Absolute 0.3  0.1 - 1.0 K/uL   Eosinophils Relative 6 (*) 0 - 5 %   Eosinophils Absolute 0.3  0.0 - 0.7 K/uL   Basophils Relative 0  0 - 1 %   Basophils Absolute 0.0  0.0 - 0.1 K/uL  BASIC METABOLIC PANEL     Status: Abnormal   Collection Time    12/11/13  4:37 AM      Result Value Ref Range   Sodium 141  137 - 147 mEq/L   Potassium 5.7 (*) 3.7 - 5.3 mEq/L   Chloride 95 (*) 96 - 112 mEq/L   CO2 23  19 - 32 mEq/L   Glucose, Bld 106 (*) 70 - 99 mg/dL   BUN 57 (*) 6 - 23 mg/dL   Creatinine, Ser 11.11 (*) 0.50 - 1.10 mg/dL   Calcium 10.1  8.4 - 10.5 mg/dL   GFR calc non Af Amer 3 (*) >90 mL/min   GFR  calc Af Amer 4 (*) >90 mL/min   Comment:  (NOTE)     The eGFR has been calculated using the CKD EPI equation.     This calculation has not been validated in all clinical situations.     eGFR's persistently <90 mL/min signify possible Chronic Kidney     Disease.   Anion gap 23 (*) 5 - 15  PROTIME-INR     Status: None   Collection Time    12/11/13  4:37 AM      Result Value Ref Range   Prothrombin Time 12.9  11.6 - 15.2 seconds   INR 0.97  0.00 - 1.49  I-STAT TROPOININ, ED     Status: Abnormal   Collection Time    12/11/13  4:42 AM      Result Value Ref Range   Troponin i, poc 0.34 (*) 0.00 - 0.08 ng/mL   Comment NOTIFIED PHYSICIAN     Comment 3            Comment: Due to the release kinetics of cTnI,     a negative result within the first hours     of the onset of symptoms does not rule out     myocardial infarction with certainty.     If myocardial infarction is still suspected,     repeat the test at appropriate intervals.  I-STAT CHEM 8, ED     Status: Abnormal   Collection Time    12/11/13  4:44 AM      Result Value Ref Range   Sodium 138  137 - 147 mEq/L   Potassium 5.4 (*) 3.7 - 5.3 mEq/L   Chloride 102  96 - 112 mEq/L   BUN 53 (*) 6 - 23 mg/dL   Creatinine, Ser 11.20 (*) 0.50 - 1.10 mg/dL   Glucose, Bld 109 (*) 70 - 99 mg/dL   Calcium, Ion 1.12  1.12 - 1.23 mmol/L   TCO2 26  0 - 100 mmol/L   Hemoglobin 10.9 (*) 12.0 - 15.0 g/dL   HCT 32.0 (*) 36.0 - 46.0 %  I-STAT CG4 LACTIC ACID, ED     Status: None   Collection Time    12/11/13  4:44 AM      Result Value Ref Range   Lactic Acid, Venous 1.32  0.5 - 2.2 mmol/L  I-STAT VENOUS BLOOD GAS, ED     Status: Abnormal   Collection Time    12/11/13  5:10 AM      Result Value Ref Range   pH, Ven 7.438 (*) 7.250 - 7.300   pCO2, Ven 38.3 (*) 45.0 - 50.0 mmHg   pO2, Ven 47.0 (*) 30.0 - 45.0 mmHg   Bicarbonate 25.9 (*) 20.0 - 24.0 mEq/L   TCO2 27  0 - 100 mmol/L   O2 Saturation 84.0     Acid-Base Excess 2.0  0.0 - 2.0 mmol/L   Sample type VENOUS    CBC      Status: Abnormal   Collection Time    12/11/13  9:54 AM      Result Value Ref Range   WBC 4.5  4.0 - 10.5 K/uL   RBC 2.82 (*) 3.87 - 5.11 MIL/uL   Hemoglobin 7.9 (*) 12.0 - 15.0 g/dL   Comment: REPEATED TO VERIFY     SPECIMEN CHECKED FOR CLOTS   HCT 25.0 (*) 36.0 - 46.0 %   MCV 88.7  78.0 - 100.0 fL   MCH 28.0  26.0 -  34.0 pg   MCHC 31.6  30.0 - 36.0 g/dL   RDW 19.7 (*) 11.5 - 15.5 %   Platelets 140 (*) 150 - 400 K/uL   Comment: REPEATED TO VERIFY     SPECIMEN CHECKED FOR CLOTS  CREATININE, SERUM     Status: Abnormal   Collection Time    12/11/13  9:54 AM      Result Value Ref Range   Creatinine, Ser 11.52 (*) 0.50 - 1.10 mg/dL   GFR calc non Af Amer 3 (*) >90 mL/min   GFR calc Af Amer 4 (*) >90 mL/min   Comment: (NOTE)     The eGFR has been calculated using the CKD EPI equation.     This calculation has not been validated in all clinical situations.     eGFR's persistently <90 mL/min signify possible Chronic Kidney     Disease.  TROPONIN I     Status: Abnormal   Collection Time    12/11/13  9:54 AM      Result Value Ref Range   Troponin I 0.41 (*) <0.30 ng/mL   Comment:            Due to the release kinetics of cTnI,     a negative result within the first hours     of the onset of symptoms does not rule out     myocardial infarction with certainty.     If myocardial infarction is still suspected,     repeat the test at appropriate intervals.     CRITICAL RESULT CALLED TO, READ BACK BY AND VERIFIED WITH:     [facility] RN @ [phone] 12/11/13 LEONARD,A   Constitutional: positive for fatigue, negative for chills, fevers and sweats Ears, nose, mouth, throat, and face: positive for sore throat, negative for earaches, hoarseness and nasal congestion Respiratory: positive for productive cough with yellow sputum and dyspnea on exertion, negative for hemoptysis Cardiovascular: positive for dyspnea; negative for chest pain, chest pressure/discomfort, orthopnea and  palpitations Gastrointestinal: positive for mild nausea, negative for abdominal pain, change in bowel habits and vomiting Genitourinary:negative, anuric Musculoskeletal:negative for arthralgias, back pain, myalgias and neck pain Neurological: positive for weakness, negative for dizziness, gait problems, headaches and speech problems  Physical Exam: Filed Vitals:   12/11/13 1100  BP: 156/89  Pulse: 76  Temp: 98.7 F (37.1 C)  Resp: 23     General appearance: alert, cooperative and no distress Head: Normocephalic, without obvious abnormality, atraumatic Neck: no adenopathy, no carotid bruit, no JVD and supple, symmetrical, trachea midline Resp: clear to auscultation bilaterally Cardio: regular rate and rhythm, S1, S2 normal, no murmur, click, rub or gallop GI: soft, non-tender; bowel sounds normal; no masses,  no organomegaly Extremities: extremities normal, atraumatic, no cyanosis or edema Neurologic: Grossly normal Dialysis Access: AVG @ RFA with + bruit   Assessment/Plan: 1. Dyspnea / cough - CHF on CXR, 3 HD Txs in last 2 wks, no antibiotics.  HD pending. Suspect volume has a lot to do with it 2. ESRD - HD on TTS @ [facility] via [facility], last HD for 2:43 on 10/10; K currently 5.4.   HD pending. Will do today and tomorrow.  I am not sure what more to do to get her on board with coming to her treatments 3. Hypertension/volume - BP 156/89 on Bidil 20/37.5 mg qd, Amlodipine 10 mg qd, Metoprolol 25 mg qd; wt 47.2 kg, but CHF per CXR. Volume is causing again 4. Anemia - Hgb 7.9, Aranesp  200 mcg & Fe on Thurs, but give today. 5. Metabolic bone disease - last corrected Ca 9.8, P 4.8, iPTH 616; Hectorol 7 mcg, Sensipar 30 mg qd, no binders. Amazing that her phos is as good as it is 6. Nutrition - Alb 3.7, renal diet. 7. HIV - anitvirals.  LYLES,CHARLES 12/11/2013, 11:56 AM   Attending Nephrologist:  Corliss Parish, MD  Patient seen and examined, agree with above note with above  modifications. Known to me noncompliant HD patient presenting with productive cough , anemia and htn- all of which can be attributed to missing HD- will plan for HD today and tomorrow to get her on schedule Corliss Parish, MD 12/11/2013

## 2013-12-11 NOTE — ED Provider Notes (Signed)
Medical screening examination/treatment/procedure(s) were performed by non-physician practitioner and as supervising physician I was immediately available for consultation/collaboration.   EKG Interpretation None        Lyanne CoKevin M Devonia Farro, MD 12/11/13 Barry Brunner1935

## 2013-12-11 NOTE — ED Provider Notes (Signed)
CSN: [id number]     Arrival date & time 12/11/13  4098 History   First MD Initiated Contact with Patient 12/11/13 0425     Chief Complaint  Patient presents with  . Shortness of Breath  . Cough     (Consider location/radiation/quality/duration/timing/severity/associated sxs/prior Treatment) HPI  Bethany [name] is a 53 y.o. female with past medical history of end-stage renal disease on hemodialysis Tuesday Thursday Saturday, HIV, hypertension coming in with shortness of breath. Patient was seen here yesterday for the same and discharged with viral URI diagnosis. She states it has gotten worse since then he cannot make it due to her dialysis appointment this morning. She denies chest pain. She's had a cough for the past 2 weeks it is productive of yellow sputum. Nothing makes the symptoms better or worse. She states she's been out of her high blood pressure medicine and just got it refilled today.  Patient has no further complaints.   10 Systems reviewed and are negative for acute change except as noted in the HPI.   Past Medical History  Diagnosis Date  . 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  Started HD in [place] in 2006. She moved to [place] and received dialysis with [facility] in [facility] starting in 2009.  Moved to [facility] in August 2014 and now gets dialysis at [facility] on [address] on a TTS schedule.  HD access is right forearm AVF.  She had a failed attempt at L arm access in the past that didn't mature.   [name] HIV disease 09/29/2012    Diagnosed on 2000, on HIV meds.  On ART, viral levels undetectable per pt.     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    . Hypertension 09/29/2012   Past Surgical History  Procedure Laterality Date  . Hernia repair    . Cesarean section     Family History  Problem Relation Age of Onset  .  Diabetes Mother   . Hypertension Mother   . Hypertension Sister   . Diabetes Sister    History  Substance Use Topics  . Smoking status: Never Smoker   . Smokeless tobacco: Never Used  . Alcohol Use: No   OB History   Grav Para Term Preterm Abortions TAB SAB Ect Mult Living                 Review of Systems    Allergies  Benadryl and Latex  Home Medications   Prior to Admission medications   Medication Sig Start Date End Date Taking? Authorizing Provider  abacavir (ZIAGEN) 300 MG tablet Take 1 tablet (300 mg total) by mouth 2 (two) times daily. 03/12/13   Quentin Angst, MD  amLODipine (NORVASC) 10 MG tablet Take 10 mg by mouth daily.    Historical Provider, MD  azithromycin (ZITHROMAX) 250 MG tablet Take 1 tablet (250 mg total) by mouth daily. Take first 2 tablets together, then 1 every day until finished. 12/10/13   Shari A Upstill, PA-C  benzonatate (TESSALON PERLES) 100 MG capsule Take 1 capsule (100 mg total) by mouth 3 (three) times daily as needed for cough. 11/17/12   Penny Pia, MD  cinacalcet (SENSIPAR) 30 MG tablet Take 30 mg by mouth at bedtime.    Historical Provider, MD  etravirine [facility])  100 MG tablet Take 2 tablets (200 mg total) by mouth 2 (two) times daily with a meal. 03/12/13   Olugbemiga E Hyman HopesJegede, MD  isosorbide-hydrALAZINE (BIDIL) 20-37.5 MG per tablet Take 1 tablet by mouth 2 (two) times daily. 08/16/13   Vassie Lollarlos Madera, MD  metoprolol succinate (TOPROL-XL) 25 MG 24 hr tablet Take 25 mg by mouth daily.    Historical Provider, MD  multivitamin (RENA-VIT) TABS tablet Take 1 tablet by mouth daily. 10/02/12   Zetta BillsJay Patel, MD  Nutritional Supplements (FEEDING SUPPLEMENT, NEPRO CARB STEADY,) LIQD Take 237 mLs by mouth daily. 08/16/13   Vassie Lollarlos Madera, MD  ondansetron (ZOFRAN) 4 MG tablet Take 1 tablet (4 mg total) by mouth every 8 (eight) hours as needed for nausea or vomiting. 08/12/13   Ambrose FinlandValerie A Keck, NP  pantoprazole (PROTONIX) 40 MG tablet Take 1 tablet (40  mg total) by mouth daily. 08/16/13   Vassie Lollarlos Madera, MD  raltegravir (ISENTRESS) 400 MG tablet Take 400 mg by mouth 2 (two) times daily.    Historical Provider, MD  sertraline (ZOLOFT) 25 MG tablet Take 50 mg by mouth daily.     Historical Provider, MD  temazepam (RESTORIL) 15 MG capsule Take 15 mg by mouth at bedtime as needed for sleep.    Historical Provider, MD  tenofovir (VIREAD) 300 MG tablet Take 300 mg by mouth every 7 (seven) days. Patient takes on saturday 03/12/13   Quentin Angstlugbemiga E Jegede, MD  tetrahydrozoline 0.05 % ophthalmic solution Place 2 drops into both eyes daily as needed (for dry eyes).    Historical Provider, MD   BP 210/116  Pulse 105  Temp(Src) 98.9 F (37.2 C) (Oral)  Resp 25  Ht 5\' 2"  (1.575 m)  Wt 104 lb (47.174 kg)  BMI 19.02 kg/m2  SpO2 90% Physical Exam  Nursing note and vitals reviewed. Constitutional: She is oriented to person, place, and time. She appears well-developed and well-nourished. She appears distressed.  HENT:  Head: Normocephalic and atraumatic.  Nose: Nose normal.  Mouth/Throat: Oropharynx is clear and moist. No oropharyngeal exudate.  Eyes: Conjunctivae and EOM are normal. Pupils are equal, round, and reactive to light. No scleral icterus.  Neck: Normal range of motion. Neck supple. No JVD present. No tracheal deviation present. No thyromegaly present.  Cardiovascular: Regular rhythm and normal heart sounds.  Exam reveals no gallop and no friction rub.   No murmur heard. Tachycardia  Pulmonary/Chest: Breath sounds normal. She is in respiratory distress. She has no wheezes. She exhibits no tenderness.  Tachypnea, increased worker breathing, use of accessory muscles noted. There is no crackles or wheezing heard.  Abdominal: Soft. Bowel sounds are normal. She exhibits no distension and no mass. There is no tenderness. There is no rebound and no guarding.  Musculoskeletal: Normal range of motion. She exhibits no edema and no tenderness.  Right upper  extremity AV fistula with palpable thrill  Lymphadenopathy:    She has no cervical adenopathy.  Neurological: She is alert and oriented to person, place, and time.  Skin: Skin is warm and dry. No rash noted. She is not diaphoretic. No erythema. No pallor.    ED Course  Procedures (including critical care time) Labs Review Labs Reviewed  CBC WITH DIFFERENTIAL - Abnormal; Notable for the following:    RBC 3.31 (*)    Hemoglobin 9.2 (*)    HCT 29.8 (*)    RDW 19.9 (*)    Neutrophils Relative % 78 (*)    Lymphocytes Relative 10 (*)  Lymphs Abs 0.5 (*)    Eosinophils Relative 6 (*)    All other components within normal limits  BASIC METABOLIC PANEL - Abnormal; Notable for the following:    Potassium 5.7 (*)    Chloride 95 (*)    Glucose, Bld 106 (*)    BUN 57 (*)    Creatinine, Ser 11.11 (*)    GFR calc non Af Amer 3 (*)    GFR calc Af Amer 4 (*)    Anion gap 23 (*)    All other components within normal limits  CBC - Abnormal; Notable for the following:    RBC 2.82 (*)    Hemoglobin 7.9 (*)    HCT 25.0 (*)    RDW 19.7 (*)    Platelets 140 (*)    All other components within normal limits  CREATININE, SERUM - Abnormal; Notable for the following:    Creatinine, Ser 11.52 (*)    GFR calc non Af Amer 3 (*)    GFR calc Af Amer 4 (*)    All other components within normal limits  TROPONIN I - Abnormal; Notable for the following:    Troponin I 0.41 (*)    All other components within normal limits  I-STAT CHEM 8, ED - Abnormal; Notable for the following:    Potassium 5.4 (*)    BUN 53 (*)    Creatinine, Ser 11.20 (*)    Glucose, Bld 109 (*)    Hemoglobin 10.9 (*)    HCT 32.0 (*)    All other components within normal limits  I-STAT TROPOININ, ED - Abnormal; Notable for the following:    Troponin i, poc 0.34 (*)    All other components within normal limits  I-STAT VENOUS BLOOD GAS, ED - Abnormal; Notable for the following:    pH, Ven 7.438 (*)    pCO2, Ven 38.3 (*)     pO2, Ven 47.0 (*)    Bicarbonate 25.9 (*)    All other components within normal limits  PROTIME-INR  TSH  URINALYSIS, ROUTINE W REFLEX MICROSCOPIC  TROPONIN I  TROPONIN I  CBC  RENAL FUNCTION PANEL  HEPATITIS B SURFACE ANTIGEN  I-STAT CG4 LACTIC ACID, ED    Imaging Review Dg Chest 2 View  12/11/2013   CLINICAL DATA:  Shortness of breath with productive cough.  EXAM: CHEST  2 VIEW  COMPARISON:  08/14/2013  FINDINGS: There is chronic marked cardiomegaly. Stable aortic contours and pulmonary arterial prominence. There is bronchial cuffing, perihilar airspace opacity, fissural thickening, and diffuse interstitial coarsening. No effusion or pneumothorax.  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 No calcified fluid levels.  IMPRESSION: CHF.   Electronically Signed   By: Tiburcio PeaJonathan  Watts M.D.   On: 12/11/2013 05:54     EKG Interpretation   Date/Time:  Wednesday December 11 2013 04:20:11 EDT Ventricular Rate:  102 PR Interval:  160 QRS Duration: 106 QT Interval:  358 QTC Calculation: 466 R Axis:   38 Text Interpretation:  Sinus tachycardia Biatrial enlargement RSR' in V1 or  V2, right VCD or RVH ST depression in Lateral leads Confirmed by Erroll Lunani,  Camilla Skeen Ayokunle [phone]([id number]) on 12/11/2013 4:34:19 AM      MDM   Final diagnoses:  None    Patient presents emergency department for shortness of breath. This is a bounce back patient. Will obtain laboratory studies including troponin, cxr, and her home blood pressure medicines will be given to her. She  received subling nitro as well.  Bedside ultrasound reveals diffuse B. lines in lung fields. Patient likely has volume overload and her lungs despite being compliant with dialysis. Patient currently at chest x-ray, we'll reevaluate, consider BiPAP and nephrology consult.   Patient symptoms improved with just [place].  HEr tachypnea has mostly resolved.  Will  still need observation for HD today.  Admitted to hospitalist, tele unit.   Tomasita Crumble, MD 12/11/13 [id number]

## 2013-12-11 NOTE — Progress Notes (Signed)
INITIAL NUTRITION ASSESSMENT  Pt meets criteria for SEVERE MALNUTRITION in the context of chronic illness as evidenced by a 18% weight loss in 9 months and severe muscle mass loss.  DOCUMENTATION CODES Per approved criteria  -Severe malnutrition in the context of chronic illness   INTERVENTION: Provide Nepro Shake po BID, each supplement provides 425 kcal and 19 grams protein.  Encourage PO intake.  NUTRITION DIAGNOSIS: Increased nutrient needs related to chronic illness, ESRD as evidenced by estimated nutrition needs.   Goal: Pt to meet >/= 90% of their estimated nutrition needs   Monitor:  PO intake, weight trends, labs, I/O's  Reason for Assessment: MST  53 y.o. female  Admitting Dx: Fluid overload  ASSESSMENT: Pt with a past medical history of end-stage renal disease, currently on hemodialysis on Tuesdays Thursdays and Saturdays, HIV, systolic heart failure, hypertension, presenting to the emergency room with complaints of cough and shortness of breath. She reports having worsening shortness of breath over the past 24 hours that is associated with cough having yellowish sputum production and sore throat. Workup in the emergency room included a chest x-ray which showed findings suggestive of CHF.  Pt reports her appetite has been improving and she has been able to eat more. Pt reports she had a decreased appetite in June 2015 during the time she was sick, which has caused her to lose weight. Pt reports she has gotten her appetite back this month. Per Epic weight records, pt with a 18% weight loss in 9 months. Pt was just advanced to renal diet this AM.  Pt was encouraged to eat her food at meals. PTA diet recall includes 2 full meals a day (which is her baseline) and that usually  includes soup for lunch and [employer] for dinner. Pt reports she would like to have Nepro ordered. Will order. Pt was educated to continue drinking Nepro at home to help with extra calorie and protein  needs.  Nutrition Focused Physical Exam:  Subcutaneous Fat:  Orbital Region: N/A Upper Arm Region: Moderate depletion Thoracic and Lumbar Region: WNL  Muscle:  Temple Region: WNL Clavicle Bone Region: WNL Clavicle and Acromion Bone Region: WNL Scapular Bone Region: N/A Dorsal Hand: WNL Patellar Region: Severe depletion Anterior Thigh Region: Severe depletion Posterior Calf Region: Severe depletion  Edema: none  Labs: High potassium (5.4), BUN, and creatinine.  Height: Ht Readings from Last 1 Encounters:  12/11/13 5\' 2"  (1.575 m)    Weight: Wt Readings from Last 1 Encounters:  12/11/13 104 lb (47.174 kg)    Ideal Body Weight: 110 lbs  % Ideal Body Weight: 95%  Wt Readings from Last 10 Encounters:  12/11/13 104 lb (47.174 kg)  08/16/13 104 lb 15 oz (47.6 kg)  08/12/13 111 lb 12.8 oz (50.712 kg)  07/12/13 111 lb 5.3 oz (50.5 kg)  03/26/13 113 lb 1.5 oz (51.3 kg)  03/12/13 127 lb (57.607 kg)  11/16/12 126 lb 12.2 oz (57.5 kg)  10/02/12 117 lb 8.1 oz (53.3 kg)   Usual Body Weight: 125 lbs  % Usual Body Weight: 83%  BMI:  Body mass index is 19.02 kg/(m^2).  Estimated Nutritional Needs: Kcal: 1500-1700 Protein: 75-85 grams Fluid: 1.2 L/day  Skin: intact  Diet Order: Renal with 1200 ml fluids  EDUCATION NEEDS: -Education needs addressed   Intake/Output Summary (Last 24 hours) at 12/11/13 1116 Last data filed at 12/11/13 0930  Gross per 24 hour  Intake      0 ml  Output  0 ml  Net      0 ml    Last BM: PTA  Labs:   Recent Labs Lab 12/11/13 0437 12/11/13 0444 12/11/13 0954  NA 141 138  --   K 5.7* 5.4*  --   CL 95* 102  --   CO2 23  --   --   BUN 57* 53*  --   CREATININE 11.11* 11.20* 11.52*  CALCIUM 10.1  --   --   GLUCOSE 106* 109*  --     CBG (last 3)  No results found for this basename: GLUCAP,  in the last 72 hours  Scheduled Meds: . abacavir  300 mg Oral BID  . amLODipine  10 mg Oral Daily  . cinacalcet  30 mg Oral  QHS  . etravirine  200 mg Oral BID WC  . feeding supplement (NEPRO CARB STEADY)  237 mL Oral Daily  . heparin  5,000 Units Subcutaneous 3 times per day  . isosorbide-hydrALAZINE  1 tablet Oral BID  . metoprolol succinate  25 mg Oral Daily  . pantoprazole  40 mg Oral Daily  . raltegravir  400 mg Oral BID  . sertraline  50 mg Oral Daily  . sodium chloride  3 mL Intravenous Q12H  . [START ON 12/14/2013] tenofovir  300 mg Oral Q Sat    Continuous Infusions:   Past Medical History  Diagnosis Date  . 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  [place] HD in [place] in 2006. [place] moved [place] and received dialysis with [facility] [place] starting in [place].  [place] and now gets dialysis [facility] on [address] on a TTS schedule.  HD access is right forearm AVF.  She had a failed attempt at L arm access in the past that didn't mature.   [name]. HIV disease 09/29/2012    Diagnosed on 2000, on HIV meds.  On ART, viral levels undetectable per pt.     [name]. 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    . Hypertension 09/29/2012    Past Surgical History  Procedure Laterality Date  . Hernia repair    . Cesarean section      Marijean NiemannStephanie La, MS, RD, LDN Pager # [phone] After hours/ weekend pager # [phone]

## 2013-12-11 NOTE — Procedures (Signed)
Patient was seen on dialysis and the procedure was supervised.  BFR 450  Via AVG BP is  147/88.   Patient appears to be tolerating treatment well  Bethany [name] A 12/11/2013

## 2013-12-11 NOTE — ED Notes (Signed)
I stat troponin results given to Dr. Mora Bellmanni by B. Bing PlumeHaynes, EMT

## 2013-12-11 NOTE — ED Notes (Signed)
Patient transported to X-ray 

## 2013-12-11 NOTE — ED Notes (Signed)
Pt states she is been having some SOB and coughing for the last 2 weeks, no fever, sweats or chills, pt is a HD on T, TH and Saturday, pt miss Tuesday section. Pt presented to ED coughing and labored breading.

## 2013-12-11 NOTE — H&P (Signed)
[facility] History and Physical  Bethany [name] ZOX:[id number] DOB: 24-Feb-1961 DOA: 12/11/2013  Referring physician:  PCP: Bethany Lewandowsky, MD   Chief Complaint: Shortness of breath  HPI: Bethany [name] is a 53 y.o. female with a past medical history of end-stage renal disease, currently on hemodialysis on Tuesdays Thursdays and Saturdays, HIV, systolic heart failure, hypertension, presenting to the emergency room with complaints of cough and shortness of breath. She reports having worsening shortness of breath over the past 24 hours that is associated with cough having yellowish sputum production and sore throat. She states that symptoms started about a week ago. She was seen in the emergency room yesterday for similar complaints and discharged in stable condition on a Z-Pack. Unfortunately she missed her dialysis session yesterday. She denies recent travel, sick contacts, fevers, chills, chest pain, syncope, dizziness, palpitations or extremity edema. Workup in the emergency room included a chest x-ray which showed findings suggestive of CHF. There were no obvious infiltrates seen on exam.                                                                                                                                                                                                                                                                                                 Review of Systems:  Constitutional:  No weight loss, night sweats, Fevers, chills, fatigue.  HEENT:  No headaches, Difficulty swallowing,Tooth/dental problems, positive for Sore throat No sneezing, itching, ear ache, nasal congestion, post nasal drip,  Cardio-vascular:  No chest pain, Orthopnea, PND, swelling in lower extremities, anasarca, dizziness, palpitations  GI:  No heartburn, indigestion, abdominal pain, nausea, vomiting, diarrhea, change in bowel habits, loss of appetite  Resp:  Positive for shortness of  breath with exertion or at rest. No excess mucus, no productive cough, No non-productive cough, No coughing up of blood.No change in color of mucus.No wheezing.No chest wall deformity  Skin:  no rash or lesions.  GU:  no dysuria, change in color of urine, no urgency or frequency. No flank pain.  Musculoskeletal:  No joint pain or swelling. No decreased range of  motion. No back pain.  Psych:  No change in mood or affect. No depression or anxiety. No memory loss.   Past Medical History  Diagnosis Date  . 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  [place] HD in [place] in 2006. [place] and [place] [facility] [place] [facility] [place] [facility] [place] in [place].  [place] and [facility] on [address] on a TTS schedule.  HD access is right forearm AVF.  She had a failed attempt at L arm access in the past that didn't mature.   [name]. HIV disease 09/29/2012    Diagnosed on 2000, on HIV meds.  On ART, viral levels undetectable per pt.     [name]. 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    . Hypertension 09/29/2012   Past Surgical History  Procedure Laterality Date  . Hernia repair    . Cesarean section     Social History:  reports that she has never smoked. She has never used smokeless tobacco. She reports that she does not drink alcohol or use illicit drugs.  Allergies  Allergen Reactions  . Benadryl [Diphenhydramine Hcl] Other (See Comments)    Makes patient jittery  . Latex Hives and Itching    Family History  Problem Relation Age of Onset  . Diabetes Mother   . Hypertension Mother   . Hypertension Sister   . Diabetes Sister      Prior to Admission medications   Medication Sig Start Date End Date Taking? Authorizing Provider  abacavir (ZIAGEN) 300 MG tablet Take 1 tablet (300 mg total) by mouth 2  (two) times daily. 03/12/13  Yes Quentin Angstlugbemiga E Jegede, MD  amLODipine (NORVASC) 10 MG tablet Take 10 mg by mouth daily.   Yes Historical Provider, MD  azithromycin (ZITHROMAX) 250 MG tablet Take 1 tablet (250 mg total) by mouth daily. Take first 2 tablets together, then 1 every day until finished. 12/10/13  Yes Shari A Upstill, PA-C  benzonatate (TESSALON PERLES) 100 MG capsule Take 1 capsule (100 mg total) by mouth 3 (three) times daily as needed for cough. 11/17/12  Yes Penny Piarlando Vega, MD  cinacalcet (SENSIPAR) 30 MG tablet Take 30 mg by mouth at bedtime.   Yes Historical Provider, MD  etravirine (INTELENCE) 100 MG tablet Take 2 tablets (200 mg total) by mouth 2 (two) times daily with a meal. 03/12/13  Yes Olugbemiga E Jegede, MD  isosorbide-hydrALAZINE (BIDIL) 20-37.5 MG per tablet Take 1 tablet by mouth 2 (two) times daily. 08/16/13  Yes Vassie Lollarlos Madera, MD  metoprolol succinate (TOPROL-XL) 25 MG 24 hr tablet Take 25 mg by mouth daily.   Yes Historical Provider, MD  multivitamin (RENA-VIT) TABS tablet Take 1 tablet by mouth daily. 10/02/12  Yes Zetta BillsJay Patel, MD  Nutritional Supplements (FEEDING SUPPLEMENT, NEPRO CARB STEADY,) LIQD Take 237 mLs by mouth daily. 08/16/13  Yes Vassie Lollarlos Madera, MD  ondansetron (ZOFRAN) 4 MG tablet Take 1 tablet (4 mg total) by mouth every 8 (eight) hours as needed for nausea or vomiting. 08/12/13  Yes Ambrose FinlandValerie A Keck, NP  pantoprazole (PROTONIX) 40 MG tablet Take 1 tablet (40 mg total) by mouth daily. 08/16/13  Yes Vassie Lollarlos Madera, MD  raltegravir (ISENTRESS) 400 MG tablet Take 400 mg by mouth 2 (two) times daily.  Yes Historical Provider, MD  sertraline (ZOLOFT) 25 MG tablet Take 50 mg by mouth daily.    Yes Historical Provider, MD  temazepam (RESTORIL) 15 MG capsule Take 15 mg by mouth at bedtime as needed for sleep.   Yes Historical Provider, MD  tenofovir (VIREAD) 300 MG tablet Take 300 mg by mouth every 7 (seven) days. Patient takes on saturday 03/12/13  Yes Quentin Angst, MD    tetrahydrozoline 0.05 % ophthalmic solution Place 2 drops into both eyes daily as needed (for dry eyes).   Yes Historical Provider, MD   Physical Exam: Filed Vitals:   12/11/13 0630 12/11/13 0645 12/11/13 0650 12/11/13 0700  BP: 169/104 169/107  174/110  Pulse: 91 91  92  Temp:   99 F (37.2 C)   TempSrc:   Oral   Resp: 32 25  33  Height:      Weight:      SpO2: 100% 100%  100%    Wt Readings from Last 3 Encounters:  12/11/13 47.174 kg (104 lb)  08/16/13 47.6 kg (104 lb 15 oz)  08/12/13 50.712 kg (111 lb 12.8 oz)    General:  Appears calm and comfortable, she is nontoxic appearing, no acute distress Eyes: PERRL, normal lids, irises & conjunctiva ENT: grossly normal hearing, lips & tongue, did not appreciate thrush Neck: no LAD, masses or thyromegaly Cardiovascular: RRR, 2/6 systolic ejection murmur. No LE edema. Telemetry: SR, no arrhythmias  Respiratory:  Normal respiratory effort, has a few bibasilar crackles, no wheezing rhonchi or rales noted Abdomen: soft, ntnd Skin: no rash or induration seen on limited exam Musculoskeletal: grossly normal tone BUE/BLE Psychiatric: grossly normal mood and affect, speech fluent and appropriate Neurologic: grossly non-focal.          Labs on Admission:  Basic Metabolic Panel:  Recent Labs Lab 12/11/13 0437 12/11/13 0444  NA 141 138  K 5.7* 5.4*  CL 95* 102  CO2 23  --   GLUCOSE 106* 109*  BUN 57* 53*  CREATININE 11.11* 11.20*  CALCIUM 10.1  --    Liver Function Tests: No results found for this basename: AST, ALT, ALKPHOS, BILITOT, PROT, ALBUMIN,  in the last 168 hours No results found for this basename: LIPASE, AMYLASE,  in the last 168 hours No results found for this basename: AMMONIA,  in the last 168 hours CBC:  Recent Labs Lab 12/11/13 0437 12/11/13 0444  WBC 5.2  --   NEUTROABS 4.1  --   HGB 9.2* 10.9*  HCT 29.8* 32.0*  MCV 90.0  --   PLT 198  --    Cardiac Enzymes: No results found for this basename:  CKTOTAL, CKMB, CKMBINDEX, TROPONINI,  in the last 168 hours  BNP (last 3 results)  Recent Labs  03/23/13 2103 08/14/13 2134  PROBNP >70000.0* >70000.0*   CBG: No results found for this basename: GLUCAP,  in the last 168 hours  Radiological Exams on Admission: Dg Chest 2 View  12/11/2013   CLINICAL DATA:  Shortness of breath with productive cough.  EXAM: CHEST  2 VIEW  COMPARISON:  08/14/2013  FINDINGS: There is chronic marked cardiomegaly. Stable aortic contours and pulmonary arterial prominence. There is bronchial cuffing, perihilar airspace opacity, fissural thickening, and diffuse interstitial coarsening. No effusion or pneumothorax.  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 No calcified fluid levels.  IMPRESSION: CHF.   Electronically Signed   By: Tiburcio Pea M.D.   On: 12/11/2013  05:54    EKG: Independently reviewed.   Assessment/Plan Principal Problem:   Fluid overload Active Problems:   Systolic CHF, acute on chronic   ESRD on hemodialysis   HIV disease   Hypertension   1. Acute on chronic systolic congestive heart failure. Patient presenting with complaints of increasing shortness of breath and cough over the past 24 hours. Chest x-ray done on admission showing findings suggestive of CHF. She missed her hemodialysis session yesterday, likely precipitating acute decompensated heart failure. I discussed case with Dr. Kathrene BongoGoldsborough of nephrology, patient having a history of noncompliance to hemodialysis sessions, and has had similar presentations in the past. Patient will undergo hemodialysis today. 2. Shortness of breath/cough. Patient is nontoxic appearing, afebrile, not appearing to be in acute respiratory distress, labs showing a normal white count, I suspect current presentation secondary to volume overload from missing her hemodialysis session. Chest x-ray did not show an obvious  infiltrate. Will monitor patient off of antimicrobial therapy, followup repeat chest x-ray in a.m.  3. End-stage renal disease on hemodialysis. Patient on hemodialysis Tuesdays Thursdays and Saturdays, having a history of noncompliance to dialysis sessions. I spoke with her nephrologist Dr. Kathrene BongoGoldsborough requesting the consultation. Patient will undergo hemodialysis today. It appears patient having similar past presentations after missing dialysis. 4. Hypertension. Patient presenting with elevated blood pressures, likely related to missing hemodialysis. I suspect that she may be noncompliant with antihypertensive agents as well. Will continue Toprol-XL, Norvasc, BiDil, as needed hydralazine for systolic blood pressures greater than 165. Patient to undergo hemodialysis today as well. 5. HIV. Per medical records her last CD4 count was 220 on 11/16/2012. Continue HIV regimen.  6. DVT prophylaxis. Subcutaneous heparin   Code Status: Full Code Family Communication: Family not present Disposition Plan: Will admit to telemetry, anticipate she may require greater than 2 nights hospitalization  Time spent: 70 min  Jeralyn BennettZAMORA, [place] [facility] Pager [phone]

## 2013-12-12 ENCOUNTER — Inpatient Hospital Stay (HOSPITAL_COMMUNITY): Payer: Medicare Other

## 2013-12-12 ENCOUNTER — Encounter (HOSPITAL_COMMUNITY): Payer: Self-pay | Admitting: General Practice

## 2013-12-12 ENCOUNTER — Ambulatory Visit: Payer: Medicare Other | Admitting: Vascular Surgery

## 2013-12-12 DIAGNOSIS — B20 Human immunodeficiency virus [HIV] disease: Secondary | ICD-10-CM | POA: Diagnosis present

## 2013-12-12 DIAGNOSIS — D649 Anemia, unspecified: Secondary | ICD-10-CM | POA: Diagnosis present

## 2013-12-12 DIAGNOSIS — Z21 Asymptomatic human immunodeficiency virus [HIV] infection status: Secondary | ICD-10-CM | POA: Diagnosis present

## 2013-12-12 DIAGNOSIS — R7989 Other specified abnormal findings of blood chemistry: Secondary | ICD-10-CM

## 2013-12-12 DIAGNOSIS — J029 Acute pharyngitis, unspecified: Secondary | ICD-10-CM | POA: Diagnosis not present

## 2013-12-12 DIAGNOSIS — R748 Abnormal levels of other serum enzymes: Secondary | ICD-10-CM | POA: Diagnosis present

## 2013-12-12 DIAGNOSIS — Y832 Surgical operation with anastomosis, bypass or graft as the cause of abnormal reaction of the patient, or of later complication, without mention of misadventure at the time of the procedure: Secondary | ICD-10-CM | POA: Diagnosis present

## 2013-12-12 DIAGNOSIS — N2581 Secondary hyperparathyroidism of renal origin: Secondary | ICD-10-CM | POA: Diagnosis present

## 2013-12-12 DIAGNOSIS — I120 Hypertensive chronic kidney disease with stage 5 chronic kidney disease or end stage renal disease: Secondary | ICD-10-CM | POA: Diagnosis present

## 2013-12-12 DIAGNOSIS — Z9119 Patient's noncompliance with other medical treatment and regimen: Secondary | ICD-10-CM | POA: Diagnosis present

## 2013-12-12 DIAGNOSIS — Z992 Dependence on renal dialysis: Secondary | ICD-10-CM | POA: Diagnosis not present

## 2013-12-12 DIAGNOSIS — J810 Acute pulmonary edema: Secondary | ICD-10-CM | POA: Diagnosis present

## 2013-12-12 DIAGNOSIS — N186 End stage renal disease: Secondary | ICD-10-CM | POA: Diagnosis present

## 2013-12-12 DIAGNOSIS — I5043 Acute on chronic combined systolic (congestive) and diastolic (congestive) heart failure: Secondary | ICD-10-CM | POA: Diagnosis present

## 2013-12-12 LAB — CBC
HEMATOCRIT: 28.2 % — AB (ref 36.0–46.0)
HEMOGLOBIN: 8.7 g/dL — AB (ref 12.0–15.0)
MCH: 29.1 pg (ref 26.0–34.0)
MCHC: 30.9 g/dL (ref 30.0–36.0)
MCV: 94.3 fL (ref 78.0–100.0)
Platelets: 142 10*3/uL — ABNORMAL LOW (ref 150–400)
RBC: 2.99 MIL/uL — AB (ref 3.87–5.11)
RDW: 19.9 % — ABNORMAL HIGH (ref 11.5–15.5)
WBC: 3.3 10*3/uL — ABNORMAL LOW (ref 4.0–10.5)

## 2013-12-12 LAB — BASIC METABOLIC PANEL
Anion gap: 14 (ref 5–15)
BUN: 22 mg/dL (ref 6–23)
CHLORIDE: 100 meq/L (ref 96–112)
CO2: 27 meq/L (ref 19–32)
Calcium: 9.5 mg/dL (ref 8.4–10.5)
Creatinine, Ser: 5.78 mg/dL — ABNORMAL HIGH (ref 0.50–1.10)
GFR calc Af Amer: 9 mL/min — ABNORMAL LOW (ref >90)
GFR calc non Af Amer: 8 mL/min — ABNORMAL LOW (ref >90)
GLUCOSE: 100 mg/dL — AB (ref 70–99)
POTASSIUM: 4.6 meq/L (ref 3.7–5.3)
Sodium: 141 mEq/L (ref 137–147)

## 2013-12-12 LAB — ABO/RH: ABO/RH(D): B POS

## 2013-12-12 LAB — PREPARE RBC (CROSSMATCH)

## 2013-12-12 MED ORDER — HYDRALAZINE HCL 20 MG/ML IJ SOLN: 10.0000 mg | Freq: Four times a day (QID) | INTRAMUSCULAR | Status: DC | PRN

## 2013-12-12 MED ORDER — INFLUENZA VAC SPLIT QUAD 0.5 ML IM SUSY
0.5000 mL | PREFILLED_SYRINGE | Freq: Once | INTRAMUSCULAR | Status: AC
  Administered 2013-12-12: 0.5 mL via INTRAMUSCULAR
  Filled 2013-12-12: qty 0.5

## 2013-12-12 MED ORDER — SODIUM CHLORIDE 0.9 % IV SOLN: Freq: Once | INTRAVENOUS | Status: DC

## 2013-12-12 MED ORDER — ACETAMINOPHEN 325 MG PO TABS
ORAL_TABLET | ORAL | Status: AC
  Filled 2013-12-12: qty 2

## 2013-12-12 NOTE — Clinical Documentation Improvement (Signed)
Presents with Acute on Chronic Systolic CHF, ESRD.   HIV and HIV Disease are being used interchangeably in Medical Record   CD4 count on 11/16/12 = 820; draw this morning still pending; viral load = 25  No documentation of HIV Disease related illnesses documented  Please clarify if you feel the patient has:   AIDS/HIV Disease  HIV (+) unsymptomatic  Other Condition  Thank You, Shellee MiloEileen T Kaydon Husby ,RN Clinical Documentation Specialist:  [phone]  [facility]- Health Information Management

## 2013-12-12 NOTE — Progress Notes (Signed)
Subjective: Vomited this AM (likely due to uremia vs gastroparesis) and still coughing (due to volume overload)- hgb in the 7's will go ahead and transfuse (can also be traced back to not getting ESA with dialysis) HD yest removed 3200 , trying for another 3 liters today- also low grade temp unknown etiology possibly bronchial   Objective Vital signs in last 24 hours: Filed Vitals:   12/12/13 0458 12/12/13 0843 12/12/13 0848 12/12/13 0900  BP: 142/64 114/84 160/77 145/91  Pulse: 94 70 70 72  Temp: 99.1 F (37.3 C) 100.1 F (37.8 C)    TempSrc: Oral Oral    Resp: 18 18    Height:      Weight:  45.8 kg (100 lb 15.5 oz)    SpO2: 100% 100%     Weight change: 2.626 kg (5 lb 12.6 oz)  Intake/Output Summary (Last 24 hours) at 12/12/13 0927 Last data filed at 12/12/13 0459  Gross per 24 hour  Intake    360 ml  Output   3201 ml  Net  -2841 ml    Dialysis Orders: [facility]  4 hrs 46.5 kg 2K/2Ca 450/A1.5 Heparin 3000 U AVG @ RFA  Hectorol 7 mcg Aranesp 200 mcg & Venofer 50 mg   Assessment/Plan:  1. Dyspnea / cough - CHF on CXR, 3 HD Txs in last 2 wks, no antibiotics. HD pending. Suspect volume has a lot to do with it and also anemia- UF as able with HD- other treatments per primary team 2. ESRD - HD on TTS @ [facility] via [facility], non complianht. HD yest and today. I am not sure what more to do to get her on board with coming to her treatments- next will be due on Saturday  3. Hypertension/volume - BP 156/89 on Bidil 20/37.5 mg qd, Amlodipine 10 mg qd, Metoprolol 25 mg qd; wt 47.2 kg, but CHF per CXR. Volume is causing again- I am also pretty sure she is not taking meds as she is supposed to- BP is better- will stop amlodipine so can challenge more with UF 4. Anemia - Hgb 7.9- now 7.5, Aranesp 200 mcg & Fe on Thurs, is due to not getting ESA because not coming to HD- transfuse one unit with HD today 5. Metabolic bone disease - last corrected Ca 9.8, P 4.8, iPTH 616; Hectorol 7 mcg, Sensipar 30 mg qd,  no binders. Amazing that her phos is as good as it is 6. Nutrition - Alb 3.7, renal diet. 7. HIV - anitvirals. 8.     Jarod Bozzo A    Labs: Basic Metabolic Panel:  Recent Labs Lab 12/11/13 0437 12/11/13 0444 12/11/13 0954  NA 141 138 139  K 5.7* 5.4* 6.2*  CL 95* 102 95*  CO2 23  --  20  GLUCOSE 106* 109* 93  BUN 57* 53* 62*  CREATININE 11.11* 11.20* 11.52*  11.44*  CALCIUM 10.1  --  9.5  PHOS  --   --  8.8*   Liver Function Tests:  Recent Labs Lab 12/11/13 0954  ALBUMIN 3.0*   No results found for this basename: LIPASE, AMYLASE,  in the last 168 hours No results found for this basename: AMMONIA,  in the last 168 hours CBC:  Recent Labs Lab 12/11/13 0437 12/11/13 0444 12/11/13 0954 12/11/13 1300  WBC 5.2  --  4.5 3.8*  NEUTROABS 4.1  --   --   --   HGB 9.2* 10.9* 7.9* 7.4*  HCT 29.8* 32.0* 25.0* 23.7*  MCV 90.0  --  88.7 91.5  PLT 198  --  140* 137*   Cardiac Enzymes:  Recent Labs Lab 12/11/13 0954 12/11/13 1300 12/11/13 2010  TROPONINI 0.41* 0.43* 0.35*   CBG: No results found for this basename: GLUCAP,  in the last 168 hours  Iron Studies: No results found for this basename: IRON, TIBC, TRANSFERRIN, FERRITIN,  in the last 72 hours Studies/Results: Dg Chest 2 View  12/11/2013   CLINICAL DATA:  Shortness of breath with productive cough.  EXAM: CHEST  2 VIEW  COMPARISON:  08/14/2013  FINDINGS: There is chronic marked cardiomegaly. Stable aortic contours and pulmonary arterial prominence. There is bronchial cuffing, perihilar airspace opacity, fissural thickening, and diffuse interstitial coarsening. No effusion or pneumothorax.  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 No calcified fluid levels.  IMPRESSION: CHF.   Electronically Signed   By: Tiburcio PeaJonathan  Watts M.D.   On: 12/11/2013 05:54   Medications: Infusions:    Scheduled Medications: .  sodium chloride   Intravenous Once  . abacavir  300 mg Oral BID  . amLODipine  10 mg Oral Daily  . cinacalcet  30 mg Oral QHS  . darbepoetin (ARANESP) injection - DIALYSIS  200 mcg Intravenous Q Wed-HD  . etravirine  200 mg Oral BID WC  . feeding supplement (NEPRO CARB STEADY)  237 mL Oral BID BM  . ferric gluconate (FERRLECIT/NULECIT) IV  62.5 mg Intravenous Weekly  . heparin  5,000 Units Subcutaneous 3 times per day  . Influenza vac split quadrivalent PF  0.5 mL Intramuscular Once  . isosorbide-hydrALAZINE  1 tablet Oral BID  . metoprolol succinate  25 mg Oral Daily  . pantoprazole  40 mg Oral Daily  . raltegravir  400 mg Oral BID  . sertraline  50 mg Oral Daily  . sodium chloride  3 mL Intravenous Q12H  . [START ON 12/14/2013] tenofovir  300 mg Oral Q Sat    have reviewed scheduled and prn medications.  Physical Exam: General: seen on HD- resting  Heart: RRR Lungs: CBS bilat Abdomen: soft non tender Extremities: edema Dialysis Access: AVG accessed     12/12/2013,9:27 AM  LOS: 1 day

## 2013-12-12 NOTE — Progress Notes (Addendum)
PROGRESS NOTE  Bethany MustacheGloria [name] RUE:[id number] DOB: 1960-12-08 DOA: 12/11/2013 PCP: Bethany [name]  Assessment/Plan: Fever in a noncompliant HIV patient: monitor -recheck x ray -CD4 count check  Acute on chronic systolic congestive heart failure: s/p dialysis- repeat chest x ray after -renal following  End-stage renal disease on hemodialysis. Patient on hemodialysis Tuesdays Thursdays and Saturdays, having a history of noncompliance to dialysis sessions.  Hypertension. Patient presenting with elevated blood pressures, likely related to missing hemodialysis. I suspect that she may be noncompliant with antihypertensive agents as well. Will continue Toprol-XL, Norvasc, BiDil, as needed hydralazine for systolic blood pressures greater than 165.   HIV. Per medical records her last CD4 count was 220 on 11/16/2012. Continue HIV regimen- ? compliance  Elevated troponin- renal failure/infection- monitor  Code Status: full Family Communication: patient in dilaysis Disposition Plan:    Consultants:  renal  Procedures:      HPI/Subjective: In dialysis No complaints  Objective: Filed Vitals:   12/12/13 1100  BP: 122/68  Pulse: 68  Temp:   Resp:     Intake/Output Summary (Last 24 hours) at 12/12/13 1119 Last data filed at 12/12/13 0459  Gross per 24 hour  Intake    360 ml  Output   3201 ml  Net  -2841 ml   Filed Weights   12/11/13 1722 12/11/13 2018 12/12/13 0843  Weight: 46.6 kg (102 lb 11.8 oz) 46.601 kg (102 lb 11.8 oz) 45.8 kg (100 lb 15.5 oz)    Exam:   General:  A+Ox3, NAD  Cardiovascular: rrr  Respiratory: no wheezing  Abdomen: +Bs, soft  Musculoskeletal: no edema   Data Reviewed: Basic Metabolic Panel:  Recent Labs Lab 12/11/13 0437 12/11/13 0444 12/11/13 0954 12/12/13 0500  NA 141 138 139 141  K 5.7* 5.4* 6.2* 4.6  CL 95* 102 95* 100  CO2 23  --  20 27  GLUCOSE 106* 109* 93 100*  BUN 57* 53* 62* 22  CREATININE 11.11* 11.20*  11.52*  11.44* 5.78*  CALCIUM 10.1  --  9.5 9.5  PHOS  --   --  8.8*  --    Liver Function Tests:  Recent Labs Lab 12/11/13 0954  ALBUMIN 3.0*   No results found for this basename: LIPASE, AMYLASE,  in the last 168 hours No results found for this basename: AMMONIA,  in the last 168 hours CBC:  Recent Labs Lab 12/11/13 0437 12/11/13 0444 12/11/13 0954 12/11/13 1300 12/12/13 0500  WBC 5.2  --  4.5 3.8* 3.3*  NEUTROABS 4.1  --   --   --   --   HGB 9.2* 10.9* 7.9* 7.4* 8.7*  HCT 29.8* 32.0* 25.0* 23.7* 28.2*  MCV 90.0  --  88.7 91.5 94.3  PLT 198  --  140* 137* 142*   Cardiac Enzymes:  Recent Labs Lab 12/11/13 0954 12/11/13 1300 12/11/13 2010  TROPONINI 0.41* 0.43* 0.35*   BNP (last 3 results)  Recent Labs  03/23/13 2103 08/14/13 2134  PROBNP >70000.0* >70000.0*   CBG: No results found for this basename: GLUCAP,  in the last 168 hours  No results found for this or any previous visit (from the past 240 hour(s)).   Studies: Dg Chest 2 View  12/11/2013   CLINICAL DATA:  Shortness of breath with productive cough.  EXAM: CHEST  2 VIEW  COMPARISON:  08/14/2013  FINDINGS: There is chronic marked cardiomegaly. Stable aortic contours and pulmonary arterial prominence. There is bronchial cuffing, perihilar airspace opacity, fissural thickening, and  diffuse interstitial coarsening. No effusion or pneumothorax.  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 No calcified fluid levels.  IMPRESSION: CHF.   Electronically Signed   By: Tiburcio PeaJonathan  Watts M.D.   On: 12/11/2013 05:54    Scheduled Meds: . sodium chloride   Intravenous Once  . abacavir  300 mg Oral BID  . acetaminophen      . cinacalcet  30 mg Oral QHS  . darbepoetin (ARANESP) injection - DIALYSIS  200 mcg Intravenous Q Wed-HD  . etravirine  200 mg Oral BID WC  . feeding supplement (NEPRO CARB STEADY)  237 mL Oral BID BM  .  ferric gluconate (FERRLECIT/NULECIT) IV  62.5 mg Intravenous Weekly  . heparin  5,000 Units Subcutaneous 3 times per day  . Influenza vac split quadrivalent PF  0.5 mL Intramuscular Once  . isosorbide-hydrALAZINE  1 tablet Oral BID  . metoprolol succinate  25 mg Oral Daily  . pantoprazole  40 mg Oral Daily  . raltegravir  400 mg Oral BID  . sertraline  50 mg Oral Daily  . sodium chloride  3 mL Intravenous Q12H  . [START ON 12/14/2013] tenofovir  300 mg Oral Q Sat   Continuous Infusions:  Antibiotics Given (last 72 hours)   Date/Time Action Medication Dose   12/11/13 1817 Given   raltegravir (ISENTRESS) tablet 400 mg 400 mg   12/11/13 1817 Given   etravirine (INTELENCE) tablet 200 mg 200 mg   12/11/13 1818 Given   abacavir (ZIAGEN) tablet 300 mg 300 mg   12/11/13 2144 Given   raltegravir (ISENTRESS) tablet 400 mg 400 mg   12/11/13 2144 Given   etravirine (INTELENCE) tablet 200 mg 200 mg   12/11/13 2145 Given   abacavir (ZIAGEN) tablet 300 mg 300 mg      Principal Problem:   Fluid overload Active Problems:   ESRD on hemodialysis   HIV disease   Hypertension   Systolic CHF, acute on chronic    Time spent: 35 min    Ibrahim Mcpheeters  [facility] Pager [phone]. If 7PM-7AM, please contact night-coverage at [email or web address], password [email or web address] 12/12/2013, 11:19 AM  LOS: 1 day

## 2013-12-12 NOTE — Procedures (Signed)
Patient was seen on dialysis and the procedure was supervised.  BFR 400  Via AVG BP is  145/91.   Patient appears to be tolerating treatment well  Bethany [name] A 12/12/2013

## 2013-12-13 ENCOUNTER — Inpatient Hospital Stay (HOSPITAL_COMMUNITY): Payer: Medicare Other

## 2013-12-13 DIAGNOSIS — T82868A Thrombosis of vascular prosthetic devices, implants and grafts, initial encounter: Secondary | ICD-10-CM

## 2013-12-13 DIAGNOSIS — N186 End stage renal disease: Secondary | ICD-10-CM

## 2013-12-13 DIAGNOSIS — B20 Human immunodeficiency virus [HIV] disease: Secondary | ICD-10-CM

## 2013-12-13 LAB — CD4/CD8 (T-HELPER/T-SUPPRESSOR CELL)
CD4 absolute: 70 /uL — ABNORMAL LOW (ref 500–1900)
CD4%: 21 % — ABNORMAL LOW (ref 30.0–60.0)
CD8 T CELL ABS: 120 /uL — AB (ref 230–1000)
CD8tox: 37 % (ref 15.0–40.0)
RATIO: 0.56 — AB (ref 1.0–3.0)
TOTAL LYMPHOCYTE COUNT: 320 /uL — AB (ref 1000–4000)

## 2013-12-13 LAB — TYPE AND SCREEN
ABO/RH(D): B POS
Antibody Screen: NEGATIVE
UNIT DIVISION: 0

## 2013-12-13 MED ORDER — MIDAZOLAM HCL 2 MG/2ML IJ SOLN
INTRAMUSCULAR | Status: AC | PRN
  Administered 2013-12-13 (×2): 0.5 mg via INTRAVENOUS
  Administered 2013-12-13: 1 mg via INTRAVENOUS

## 2013-12-13 MED ORDER — ALTEPLASE 2 MG IJ SOLR
2.0000 mg | Freq: Once | INTRAMUSCULAR | Status: AC | PRN
  Filled 2013-12-13: qty 2

## 2013-12-13 MED ORDER — FENTANYL CITRATE 0.05 MG/ML IJ SOLN
INTRAMUSCULAR | Status: AC | PRN
  Administered 2013-12-13 (×2): 25 ug via INTRAVENOUS
  Administered 2013-12-13: 50 ug via INTRAVENOUS

## 2013-12-13 MED ORDER — MIDAZOLAM HCL 2 MG/2ML IJ SOLN
INTRAMUSCULAR | Status: AC
  Filled 2013-12-13: qty 4

## 2013-12-13 MED ORDER — LIDOCAINE HCL (PF) 1 % IJ SOLN
INTRAMUSCULAR | Status: AC
  Filled 2013-12-13: qty 30

## 2013-12-13 MED ORDER — HEPARIN SODIUM (PORCINE) 1000 UNIT/ML DIALYSIS
1000.0000 [IU] | INTRAMUSCULAR | Status: DC | PRN
  Filled 2013-12-13: qty 1

## 2013-12-13 MED ORDER — HEPARIN SODIUM (PORCINE) 1000 UNIT/ML IJ SOLN
INTRAMUSCULAR | Status: AC
  Filled 2013-12-13: qty 1

## 2013-12-13 MED ORDER — CEFAZOLIN SODIUM-DEXTROSE 2-3 GM-% IV SOLR
2.0000 g | INTRAVENOUS | Status: AC
  Administered 2013-12-13: 2 g via INTRAVENOUS
  Filled 2013-12-13: qty 50

## 2013-12-13 MED ORDER — HEPARIN SODIUM (PORCINE) 1000 UNIT/ML DIALYSIS
3000.0000 [IU] | Freq: Once | INTRAMUSCULAR | Status: DC
  Filled 2013-12-13: qty 3

## 2013-12-13 MED ORDER — LIDOCAINE HCL (PF) 1 % IJ SOLN: 5.0000 mL | INTRAMUSCULAR | Status: DC | PRN

## 2013-12-13 MED ORDER — SODIUM CHLORIDE 0.9 % IV SOLN: 100.0000 mL | INTRAVENOUS | Status: DC | PRN

## 2013-12-13 MED ORDER — NEPRO/CARBSTEADY PO LIQD: 237.0000 mL | ORAL | Status: DC | PRN

## 2013-12-13 MED ORDER — PENTAFLUOROPROP-TETRAFLUOROETH EX AERO: 1.0000 "application " | INHALATION_SPRAY | CUTANEOUS | Status: DC | PRN

## 2013-12-13 MED ORDER — CEFAZOLIN SODIUM-DEXTROSE 2-3 GM-% IV SOLR
INTRAVENOUS | Status: AC
  Administered 2013-12-13: 2 g via INTRAVENOUS
  Filled 2013-12-13: qty 50

## 2013-12-13 MED ORDER — LIDOCAINE-PRILOCAINE 2.5-2.5 % EX CREA: 1.0000 "application " | TOPICAL_CREAM | CUTANEOUS | Status: DC | PRN

## 2013-12-13 MED ORDER — FENTANYL CITRATE 0.05 MG/ML IJ SOLN
INTRAMUSCULAR | Status: AC
  Filled 2013-12-13: qty 2

## 2013-12-13 MED ORDER — SODIUM CHLORIDE 0.9 % IV SOLN
INTRAVENOUS | Status: AC | PRN
  Administered 2013-12-13: 10 mL/h via INTRAVENOUS

## 2013-12-13 NOTE — Progress Notes (Signed)
Subjective:  Feeling better, breathing much better, cough improving- just low grade temps- her AVG is clotted (had estensive declot on 10/6- felt like access would not last) referred to [facility] for new access but missed appt  Objective: Vital signs in last 24 hours: Temp:  [97.1 F (36.2 C)-100.1 F (37.8 C)] 98.8 F (37.1 C) (10/16 0415) Pulse Rate:  [60-80] 72 (10/16 0415) Resp:  [12-19] 17 (10/16 0415) BP: (80-163)/(43-94) 127/93 mmHg (10/16 0415) SpO2:  [100 %] 100 % (10/16 0415) Weight:  [43.817 kg (96 lb 9.6 oz)-45.8 kg (100 lb 15.5 oz)] 43.817 kg (96 lb 9.6 oz) (10/15 2042) Weight change: -4 kg (-8 lb 13.1 oz)  Intake/Output from previous day: 10/15 0701 - 10/16 0700 In: 615 [P.O.:180; I.V.:100; Blood:335] Out: 2200  Intake/Output this shift:   Lab Results:  Recent Labs  12/11/13 1300 12/12/13 0500  WBC 3.8* 3.3*  HGB 7.4* 8.7*  HCT 23.7* 28.2*  PLT 137* 142*   BMET:  Recent Labs  12/11/13 0954 12/12/13 0500  NA 139 141  K 6.2* 4.6  CL 95* 100  CO2 20 27  GLUCOSE 93 100*  BUN 62* 22  CREATININE 11.52*  11.44* 5.78*  CALCIUM 9.5 9.5  ALBUMIN 3.0*  --    No results found for this basename: PTH,  in the last 72 hours Iron Studies: No results found for this basename: IRON, TIBC, TRANSFERRIN, FERRITIN,  in the last 72 hours  Studies/Results: Dg Chest 2 View  12/12/2013   CLINICAL DATA:  Two day history of cough and difficulty breathing ; end-stage renal disease; hypertension ; HIV disease  EXAM: CHEST  2 VIEW  COMPARISON:  February 10, 2014  FINDINGS: There has been interval clearing of interstitial edema. Currently, the lungs are clear. The heart is enlarged with pulmonary vascularity. No adenopathy. No focal bone lesions. There is atherosclerotic change in the aorta.  IMPRESSION: Interval clearing of edema. Currently lungs clear. Heart remains enlarged with pulmonary vascularity within normal limits. No apparent adenopathy.   Electronically Signed   By: Bretta BangWilliam   Woodruff M.D.   On: 12/12/2013 16:37   EXAM: General appearance:  Alert, in no apparent distress Resp:  CTA without rales, rhonchi, or wheezes Cardio:  RRR without murmur or rub GI: + BS, soft and nontender Extremities:  No edema  Access:  AVG @ RFA without bruit or thrill    Dialysis Orders: [facility]  4 hrs 46.5 kg 2K/2Ca 450/A1.5 Heparin 3000 U AVG @ RFA  Hectorol 7 mcg Aranesp 200 mcg & Venofer 50 mg   Assessment/Plan: 1. Dyspnea / cough - CHF on CXR, 3 HD Txs in last 2 wks, no antibiotics, improved s/p HD 10/14 & 15. 2. ESRD - [place] [facility], last HD before admission for 2:43 on 10/10; K 4.6; LFA AVG appears to be clotted today, last declot 10/7.  Declot per IR pending. Actually with AVG using for 10 years- recent extensive declot likely this access is done.  I think the best course of action is to just place a PC and plan for a new access- it may not be done this hosp but at least scheduled so can expedite as OP.  Next HD tomorrow via PC 3. HTN/volume - BP 127/93 on Bidil 20/37.5 mg qd, Amlodipine 10 mg qd, Metoprolol 25 mg qd; wt 43.8 kg s/p net UF 2.2 L yesterday. 4. Anemia - Hgb 8.7, Aranesp 200 mcg & Fe on Thurs, received both 10/14. 5.  Sec HPT - Ca 9.5, P 4.8, iPTH 616; Hectorol 7 mcg, Sensipar 30 mg qd, no binders. 6. Nutrition - Alb 3.7, renal diet.  7. HIV - anitvirals.     LOS: 2 days   LYLES,CHARLES 12/13/2013,7:54 AM  Patient seen and examined, agree with above note with above modifications. Looks much better- access issues as above- best ase scenario gets PC and dialysis tomorrow with possible discharge Sat after dialysis but with appt for surgery for new access  Annie SableKellie Lindzie Boxx, MD 12/13/2013

## 2013-12-13 NOTE — Progress Notes (Signed)
PROGRESS NOTE  Lolly MustacheGloria Clayton ZOX:[id number] DOB: [birth year] DOA: 12/11/2013 PCP: Jeanann LewandowskyJEGEDE, OLUGBEMIGA, MD  Assessment/Plan: Fever in a noncompliant HIV patient: monitor None further. Repeat CXR neg  Acute on chronic systolic congestive heart failure: secondary to inadequate dialysis/chronic nonadherance. Repeat CXR shows cleared edema  End-stage renal disease on hemodialysis. Patient on hemodialysis Tuesdays Thursdays and Saturdays. Access clotted. permcath placed today. HD tomorrrow, then dispo per nephrology. VVS consulted  Hypertension. HD tomorrow should help  HIV. Per medical records her last CD4 count was 220 on 11/16/2012. Continue HIV regimen- ? Compliance. CD4 count now 70. Will need PCP prophylaxis. With ESRD, bactrim may not be best choice. Will d/w id.   Elevated troponin no evidence of ACS  Code Status: full Family Communication:  Disposition Plan:    Consultants:  Renal  IR  VVS  Procedures: permcath  HPI/Subjective: No complaints  Objective: Filed Vitals:   12/13/13 1821  BP: 152/95  Pulse: 67  Temp: 98.1 F (36.7 C)  Resp: 14    Intake/Output Summary (Last 24 hours) at 12/13/13 1827 Last data filed at 12/13/13 0700  Gross per 24 hour  Intake    120 ml  Output      0 ml  Net    120 ml   Filed Weights   12/12/13 0843 12/12/13 1243 12/12/13 2042  Weight: 45.8 kg (100 lb 15.5 oz) 43.9 kg (96 lb 12.5 oz) 43.817 kg (96 lb 9.6 oz)    Exam:   General:  Asleep. Briefly arousable  Cardiovascular: rrr without MGR  Respiratory: no wheezing  Abdomen: +Bs, soft  Musculoskeletal: no edema   Data Reviewed: Basic Metabolic Panel:  Recent Labs Lab 12/11/13 0437 12/11/13 0444 12/11/13 0954 12/12/13 0500  NA 141 138 139 141  K 5.7* 5.4* 6.2* 4.6  CL 95* 102 95* 100  CO2 23  --  20 27  GLUCOSE 106* 109* 93 100*  BUN 57* 53* 62* 22  CREATININE 11.11* 11.20* 11.52*  11.44* 5.78*  CALCIUM 10.1  --  9.5 9.5  PHOS  --   --  8.8*  --     Liver Function Tests:  Recent Labs Lab 12/11/13 0954  ALBUMIN 3.0*   No results found for this basename: LIPASE, AMYLASE,  in the last 168 hours No results found for this basename: AMMONIA,  in the last 168 hours CBC:  Recent Labs Lab 12/11/13 0437 12/11/13 0444 12/11/13 0954 12/11/13 1300 12/12/13 0500  WBC 5.2  --  4.5 3.8* 3.3*  NEUTROABS 4.1  --   --   --   --   HGB 9.2* 10.9* 7.9* 7.4* 8.7*  HCT 29.8* 32.0* 25.0* 23.7* 28.2*  MCV 90.0  --  88.7 91.5 94.3  PLT 198  --  140* 137* 142*   Cardiac Enzymes:  Recent Labs Lab 12/11/13 0954 12/11/13 1300 12/11/13 2010  TROPONINI 0.41* 0.43* 0.35*   BNP (last 3 results)  Recent Labs  03/23/13 2103 08/14/13 2134  PROBNP >70000.0* >70000.0*   CBG: No results found for this basename: GLUCAP,  in the last 168 hours  No results found for this or any previous visit (from the past 240 hour(s)).   Studies: Dg Chest 2 View  12/12/2013   CLINICAL DATA:  Two day history of cough and difficulty breathing ; end-stage renal disease; hypertension ; HIV disease  EXAM: CHEST  2 VIEW  COMPARISON:  February 10, 2014  FINDINGS: There has been interval clearing of interstitial edema. Currently, the lungs  are clear. The heart is enlarged with pulmonary vascularity. No adenopathy. No focal bone lesions. There is atherosclerotic change in the aorta.  IMPRESSION: Interval clearing of edema. Currently lungs clear. Heart remains enlarged with pulmonary vascularity within normal limits. No apparent adenopathy.   Electronically Signed   By: Bretta Bang M.D.   On: 12/12/2013 16:37   Ir Fluoro Guide Cv Line Right  12/13/2013   CLINICAL DATA:  53 year old female with a history of chronic renal disease and need for hemodialysis. Right-sided loop graft has been thrombosed. She has been referred for tunneled catheter placement.  EXAM: IR RIGHT FLOURO GUIDE CV LINE; IR ULTRASOUND GUIDANCE VASC ACCESS RIGHT  Date: 12/13/2013   ANESTHESIA/SEDATION: Moderate (conscious) sedation was administered during this procedure. A total of 2.0 mg Versed and 100 mg Fentanyl were administered intravenously. The patient's vital signs were monitored continuously by radiology nursing throughout the course of the procedure.  Total sedation time: 30 minutes  FLUOROSCOPY TIME:  1 min, 24 seconds minutes  TECHNIQUE: A complete informed consent was provided, with all the risks and benefits discussed with the patient before proceeding. The patient understands risks and benefits and wishes to proceed.  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  The right upper chest was prepped with chlorhexidine, and draped in the usual sterile fashion using maximum barrier technique (cap and mask, sterile gown, sterile gloves, large sterile sheet, hand hygiene and cutaneous antiseptic). Local anesthesia was attained by infiltration with 1% lidocaine without epinephrine.  Ultrasound demonstrated patency of the right subclavian vein, and this was documented with an image. Under real-time ultrasound guidance, this vein was accessed with a 21 gauge micropuncture needle and image documentation was performed. A small dermatotomy was made at the access site with an 11 scalpel. A 0.018" wire was advanced into the SVC and the access needle exchanged for a 28F micropuncture vascular sheath. The 0.018" wire was then removed and a 0.035" wire advanced into the IVC.  An appropriate location for the tunneled catheter was selected below the clavicle and an incision was made through the skin and underlying soft tissues. The skin and subcutaneous tissues below the right clavicle were generously infiltrated with 1% lidocaine. A small stab incision with an 11 blade scalpel was made, and a 19 cm tip to cuff split tip hemodialysis catheter was back tunneled to the  puncture incision site.  The venous access site was then serially dilated and a peel away vascular sheath placed over the wire. The wire was removed and the port catheter advanced into position under fluoroscopic guidance. The catheter tip is positioned in the right atrium. This was documented with a spot image. Excellent flow both the red and blue port was confirmed, and the red and blue ports were flushed with heparinized saline lock. A retention suture was placed in each of the wings. Dermabond was used to seal the puncture site.  Patient tolerated procedure well and remained hemodynamically stable throughout.  No complications were encountered and no significant blood loss was encountered.  COMPLICATIONS: None.  The patient tolerated the procedure well.  IMPRESSION: Status post placement of tunneled 19 cm tip to cuff split tip hemodialysis catheter is in the right subclavian vein. Catheter is ready for use.  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  Signed,  Yvone Neu. Loreta Ave, DO  [facility]  [facility]   Electronically Signed   By: Gilmer MorJaime  Wagner D.O.   On: 12/13/2013 18:09   Ir Kore[place] Guide Vasc Access Right  12/13/2013   CLINICAL DATA:  53 year old female with a history of chronic renal disease and need for hemodialysis. Right-sided loop graft has been thrombosed. She has been referred for tunneled catheter placement.  EXAM: IR RIGHT FLOURO GUIDE CV LINE; IR ULTRASOUND GUIDANCE VASC ACCESS RIGHT  Date: 12/13/2013  ANESTHESIA/SEDATION: Moderate (conscious) sedation was administered during this procedure. A total of 2.0 mg Versed and 100 mg Fentanyl were administered intravenously. The patient's vital signs were monitored continuously by radiology nursing throughout the course of the procedure.  Total sedation time: 30 minutes  FLUOROSCOPY TIME:  1 min, 24 seconds minutes  TECHNIQUE: A complete informed consent was  provided, with all the risks and benefits discussed with the patient before proceeding. The patient understands risks and benefits and wishes to proceed.  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  The right upper chest was prepped with chlorhexidine, and draped in the usual sterile fashion using maximum barrier technique (cap and mask, sterile gown, sterile gloves, large sterile sheet, hand hygiene and cutaneous antiseptic). Local anesthesia was attained by infiltration with 1% lidocaine without epinephrine.  Ultrasound demonstrated patency of the right subclavian vein, and this was documented with an image. Under real-time ultrasound guidance, this vein was accessed with a 21 gauge micropuncture needle and image documentation was performed. A small dermatotomy was made at the access site with an 11 scalpel. A 0.018" wire was advanced into the SVC and the access needle exchanged for a 75F micropuncture vascular sheath. The 0.018" wire was then removed and a 0.035" wire advanced into the IVC.  An appropriate location for the tunneled catheter was selected below the clavicle and an incision was made through the skin and underlying soft tissues. The skin and subcutaneous tissues below the right clavicle were generously infiltrated with 1% lidocaine. A small stab incision with an 11 blade scalpel was made, and a 19 cm tip to cuff split tip hemodialysis catheter was back tunneled to the puncture incision site.  The venous access site was then serially dilated and a peel away vascular sheath placed over the wire. The wire was removed and the port catheter advanced into position under fluoroscopic guidance. The catheter tip is positioned in the right atrium. This was documented with a spot image. Excellent flow both the red and blue port was confirmed, and the red and blue ports were  flushed with heparinized saline lock. A retention suture was placed in each of the wings. Dermabond was used to seal the puncture site.  Patient tolerated procedure well and remained hemodynamically stable throughout.  No complications were encountered and no significant blood loss was encountered.  COMPLICATIONS: None.  The patient tolerated the procedure well.  IMPRESSION: Status post placement of tunneled 19 cm tip to cuff split tip hemodialysis catheter is in the right subclavian vein. Catheter is ready for use.  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  Signed,  Yvone NeuJaime S. Loreta AveWagner, DO  [facility]  [facility]   Electronically Signed   By: Gilmer MorJaime  Wagner D.O.   On: 12/13/2013 18:09    Scheduled Meds: . sodium chloride   Intravenous Once  .  abacavir  300 mg Oral BID  . cinacalcet  30 mg Oral QHS  . darbepoetin (ARANESP) injection - DIALYSIS  200 mcg Intravenous Q Wed-HD  . etravirine  200 mg Oral BID WC  . feeding supplement (NEPRO CARB STEADY)  237 mL Oral BID BM  . fentaNYL      . ferric gluconate (FERRLECIT/NULECIT) IV  62.5 mg Intravenous Weekly  . heparin      . [START ON 12/14/2013] heparin  3,000 Units Dialysis Once in dialysis  . heparin  5,000 Units Subcutaneous 3 times per day  . isosorbide-hydrALAZINE  1 tablet Oral BID  . lidocaine (PF)      . lidocaine (PF)      . metoprolol succinate  25 mg Oral Daily  . midazolam      . pantoprazole  40 mg Oral Daily  . raltegravir  400 mg Oral BID  . sertraline  50 mg Oral Daily  . sodium chloride  3 mL Intravenous Q12H  . [START ON 12/14/2013] tenofovir  300 mg Oral Q Sat   Continuous Infusions:  Antibiotics Given (last 72 hours)   Date/Time Action Medication Dose   12/11/13 1817 Given   raltegravir (ISENTRESS) tablet 400 mg 400 mg   12/11/13 1817 Given   etravirine (INTELENCE) tablet 200 mg 200 mg   12/11/13 1818 Given   abacavir (ZIAGEN)  tablet 300 mg 300 mg   12/11/13 2144 Given   raltegravir (ISENTRESS) tablet 400 mg 400 mg   12/11/13 2144 Given   etravirine (INTELENCE) tablet 200 mg 200 mg   12/11/13 2145 Given   abacavir (ZIAGEN) tablet 300 mg 300 mg   12/12/13 2233 Given   etravirine (INTELENCE) tablet 200 mg 200 mg   12/12/13 2234 Given   raltegravir (ISENTRESS) tablet 400 mg 400 mg   12/12/13 2234 Given   abacavir (ZIAGEN) tablet 300 mg 300 mg   12/13/13 1714 Given   ceFAZolin (ANCEF) IVPB 2 g/50 mL premix 2 g     Time spent: 25 min  Leslyn Monda L  [facility] Pager [phone]. If 7PM-7AM, please contact night-coverage at [email or web address], password [facility] 12/13/2013, 6:27 PM  LOS: 2 days

## 2013-12-13 NOTE — Progress Notes (Signed)
Near beginning of evening shift, HD catheter was found to be bleeding at the site.  HD catheter was newly placed today.  On-call IR called and made aware of situation; requested that the dressing be changed and to apply pressure to the site.  IV team paged.  New dressing applied.  Shortly afterwards, blood began to slowly saturate new dressing.  IV team called again, and another new dressing applied.  Instructed to call IR if this second attempt fails.  Patient alert and oriented throughout the night, and complains only of pain at HD catheter site.  Will continue to monitor patient.

## 2013-12-13 NOTE — Consult Note (Signed)
[facility] NOTE   MRN : [id number]  Reason for Consult: ESRD  Referring Physician: Cecille Aver   History of Present Illness: Bethany [name] is a 53 y.o. female who is currently on dialysis until her right forearm graft became clotted after dialysis yesterday 12/12/2013 and left arm failed access both upper and lower arm in the past.  She states she has had this right forearm graft for the last 10 years.  The graft has been worked on several times in the past for clotting problems in the past.  We are bing consulted for new access.  Past medical history includes: hypertension, HIV, and ESRD on dialysis.  She currently is not on Asprin , statins or beta blockers.  She takes Norvasc for her blood pressure control.       Current Facility-Administered Medications  Medication Dose Route Frequency Provider Last Rate Last Dose  . 0.9 %  sodium chloride infusion  250 mL Intravenous PRN Jeralyn Bennett, MD      . 0.9 %  sodium chloride infusion   Intravenous Once Cecille Aver, MD      . 0.9 %  sodium chloride infusion  100 mL Intravenous PRN Gerome Apley, PA-C      . 0.9 %  sodium chloride infusion  100 mL Intravenous PRN Gerome Apley, PA-C      . abacavir (ZIAGEN) tablet 300 mg  300 mg Oral BID Jeralyn Bennett, MD   300 mg at 12/12/13 2234  . acetaminophen (TYLENOL) tablet 650 mg  650 mg Oral Q6H PRN Jeralyn Bennett, MD   650 mg at 12/12/13 0932   Or  . acetaminophen (TYLENOL) suppository 650 mg  650 mg Rectal Q6H PRN Jeralyn Bennett, MD      . alteplase (CATHFLO ACTIVASE) injection 2 mg  2 mg Intracatheter Once PRN Gerome Apley, PA-C      . alum & mag hydroxide-simeth (MAALOX/MYLANTA) 200-200-20 MG/5ML suspension 30 mL  30 mL Oral Q6H PRN Jeralyn Bennett, MD      . benzonatate (TESSALON) capsule 100 mg  100 mg Oral TID PRN Jeralyn Bennett, MD   100 mg at 12/12/13 0932  . cinacalcet (SENSIPAR) tablet 30 mg  30 mg Oral QHS Jeralyn Bennett, MD   30 mg at 12/12/13 2234  . darbepoetin (ARANESP) injection 200 mcg  200 mcg Intravenous Q Wed-HD Gerome Apley, PA-C   200 mcg at 12/11/13 1539  . etravirine (INTELENCE) tablet 200 mg  200 mg Oral BID WC Jeralyn Bennett, MD   200 mg at 12/12/13 2233  . feeding supplement (NEPRO CARB STEADY) liquid 237 mL  237 mL Oral BID BM Marijean Niemann, RD      . feeding supplement (NEPRO CARB STEADY) liquid 237 mL  237 mL Oral PRN Gerome Apley, PA-C      . ferric gluconate (NULECIT) 62.5 mg in sodium chloride 0.9 % 100 mL IVPB  62.5 mg Intravenous Weekly Gerome Apley, PA-C   62.5 mg at 12/11/13 1356  . heparin injection 1,000 Units  1,000 Units Dialysis PRN Gerome Apley, PA-C      . Melene Muller ON 12/14/2013] heparin injection 3,000 Units  3,000 Units Dialysis Once in dialysis Gerome Apley, PA-C      . heparin injection 5,000 Units  5,000 Units Subcutaneous 3 times per day Jeralyn Bennett, MD   5,000 Units at 12/13/13 0543  . isosorbide-hydrALAZINE (BIDIL) 20-37.5 MG per tablet 1 tablet  1 tablet Oral BID Ezequiel  Vanessa BarbaraZamora, MD   1 tablet at 12/12/13 2235  . lidocaine (PF) (XYLOCAINE) 1 % injection 5 mL  5 mL Intradermal PRN Gerome Apleyharles Lyles, PA-C      . lidocaine-prilocaine (EMLA) cream 1 application  1 application Topical PRN Gerome Apleyharles Lyles, PA-C      . menthol-cetylpyridinium (CEPACOL) lozenge 3 mg  1 lozenge Oral PRN Jeralyn BennettEzequiel Zamora, MD      . metoprolol succinate (TOPROL-XL) 24 hr tablet 25 mg  25 mg Oral Daily Jeralyn BennettEzequiel Zamora, MD   25 mg at 12/12/13 2235  . morphine 2 MG/ML injection 2 mg  2 mg Intravenous Q4H PRN Jeralyn BennettEzequiel Zamora, MD      . ondansetron (ZOFRAN) tablet 4 mg  4 mg Oral Q6H PRN Jeralyn BennettEzequiel Zamora, MD       Or  . ondansetron (ZOFRAN) injection 4 mg  4 mg Intravenous Q6H PRN Jeralyn BennettEzequiel Zamora, MD   4 mg at 12/11/13 2107  . oxyCODONE (Oxy IR/ROXICODONE) immediate release tablet 5 mg  5 mg Oral Q4H PRN Jeralyn BennettEzequiel Zamora, MD   5 mg at 12/11/13 1023  . pantoprazole (PROTONIX) EC tablet 40 mg  40 mg  Oral Daily Jeralyn BennettEzequiel Zamora, MD   40 mg at 12/12/13 2235  . pentafluoroprop-tetrafluoroeth (GEBAUERS) aerosol 1 application  1 application Topical PRN Gerome Apleyharles Lyles, PA-C      . raltegravir (ISENTRESS) tablet 400 mg  400 mg Oral BID Jeralyn BennettEzequiel Zamora, MD   400 mg at 12/12/13 2234  . sertraline (ZOLOFT) tablet 50 mg  50 mg Oral Daily Jeralyn BennettEzequiel Zamora, MD   50 mg at 12/12/13 2235  . sodium chloride 0.9 % injection 3 mL  3 mL Intravenous Q12H Jeralyn BennettEzequiel Zamora, MD   3 mL at 12/12/13 2236  . sodium chloride 0.9 % injection 3 mL  3 mL Intravenous PRN Jeralyn BennettEzequiel Zamora, MD      . temazepam (RESTORIL) capsule 15 mg  15 mg Oral QHS PRN Jeralyn BennettEzequiel Zamora, MD      . Melene Muller[START ON 12/14/2013] tenofovir (VIREAD) tablet 300 mg  300 mg Oral Q Sat Jeralyn BennettEzequiel Zamora, MD        Pt meds include: Statin :No Betablocker: No ASA: No Other anticoagulants/antiplatelets:   Past Medical History  Diagnosis Date  . HIV disease dx'd 2000    Diagnosed on 2000, on HIV meds.  On ART, viral levels undetectable per pt.     [name]. 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    . Hypertension 09/29/2012  . ESRD on hemodialysis since 2006    ESRD due to HTN.  [place] in [place] in 2006. [place] and [place] [facility] [place].  [place] [facility] [place] and [facility] on [address] on a TTS schedule.  HD access is right forearm AVF.  She had a failed attempt at L arm access in the past that didn't mature.   . Pneumonia "several times"  . Anemia   . History of blood transfusion     "I guess related to my kidneys"  . Seizures X 1    "never dx'd" (12/12/2013)  . Stroke ~ 2008    Past Surgical History  Procedure Laterality Date  . Hernia repair    . Cesarean section  2004  . Umbilical hernia repair  2008?  . Tubal ligation   2004  . Av fistula placement Right ~ 2005  . Thrombectomy Right X 3    AVF  . Av fistula placement Left ~ 2005    "tried to put it in my arm but it wouldn't work"  . Eye surgery      "related to high blood pressure"    Social History History  Substance Use Topics  . Smoking status: Never Smoker   . Smokeless tobacco: Never Used  . Alcohol Use: No    Family History Family History  Problem Relation Age of Onset  . Diabetes Mother   . Hypertension Mother   . Hypertension Sister   . Diabetes Sister     Allergies  Allergen Reactions  . Benadryl [Diphenhydramine Hcl] Other (See Comments)    Makes patient jittery  . Latex Hives and Itching     REVIEW OF SYSTEMS  General: [ ]  Weight loss, [ ]  Fever, [ ]  chills Neurologic: [ ]  Dizziness, [ ]  Blackouts, [ ]  Seizure [ ]  Stroke, [ ]  "Mini stroke", [ ]  Slurred speech, [ ]  Temporary blindness; [ ]  weakness in arms or legs, [ ]  Hoarseness [ ]  Dysphagia Cardiac: [ ]  Chest pain/pressure, [ ]  Shortness of breath at rest [ ]  Shortness of breath with exertion, [ ]  Atrial fibrillation or irregular heartbeat  Vascular: [ ]  Pain in legs with walking, [ ]  Pain in legs at rest, [ ]  Pain in legs at night,  [ ]  Non-healing ulcer, [ ]  Blood clot in vein/DVT,   Pulmonary: [ ]  Home oxygen, [x Productive cough, [ ]  Coughing up blood, [ ]  Asthma,  [ ]  Wheezing [ ]  COPD Musculoskeletal:  [ ]  Arthritis, [ ]  Low back pain, [ ]  Joint pain Hematologic: [ ]  Easy Bruising, [ ]  Anemia; [ ]  Hepatitis Gastrointestinal: [ ]  Blood in stool, [ ]  Gastroesophageal Reflux/heartburn, Urinary: [x ] chronic Kidney disease, [ ]  on HD - [x ] MWF or [ ]  TTHS, [ ]  Burning with urination, [ ]  Difficulty urinating Skin: [ ]  Rashes, [ ]  Wounds Psychological: [ ]  Anxiety, [ ]  Depression  Physical Examination Filed Vitals:   12/12/13 1754 12/12/13 2042 12/13/13 0415 12/13/13 1043  BP: 136/81 158/94 127/93 160/118  Pulse: 74 73 72 71  Temp: 98.8 F (37.1 C) 99 F (37.2  C) 98.8 F (37.1 C) 99.2 F (37.3 C)  TempSrc: Oral   Oral  Resp: 19 17 17 18   Height:      Weight:  96 lb 9.6 oz (43.817 kg)    SpO2: 100% 100% 100% 100%   Body mass index is 17.66 kg/(m^2).  General:  WDWN in NAD Gait: Normal HENT: WNL Eyes: Pupils equal Pulmonary: normal non-labored breathing , without Rales, rhonchi,  wheezing Cardiac: RRR, without  Murmurs, rubs or gallops; No carotid bruits Abdomen: soft, NT, no masses Skin: no rashes, ulcers noted;  no Gangrene , no cellulitis; no open wounds;   Vascular Exam/Pulses:Palpable radial and brachial pulses right upper extremity   Musculoskeletal: no muscle wasting or atrophy; no edema  Neurologic: A&O X 3; Appropriate Affect ;  SENSATION: normal; MOTOR FUNCTION: 5/5 Symmetric Speech is fluent/normal   Significant Diagnostic Studies: CBC Lab Results  Component Value Date   WBC 3.3* 12/12/2013   HGB 8.7* 12/12/2013   HCT 28.2* 12/12/2013   MCV 94.3 12/12/2013   PLT 142* 12/12/2013    BMET    Component Value Date/Time   NA 141  12/12/2013 0500   K 4.6 12/12/2013 0500   CL 100 12/12/2013 0500   CO2 27 12/12/2013 0500   GLUCOSE 100* 12/12/2013 0500   BUN 22 12/12/2013 0500   CREATININE 5.78* 12/12/2013 0500   CALCIUM 9.5 12/12/2013 0500   GFRNONAA 8* 12/12/2013 0500   GFRAA 9* 12/12/2013 0500   Estimated Creatinine Clearance: 8 ml/min (by C-G formula based on Cr of 5.78).  COAG Lab Results  Component Value Date   INR 0.97 12/11/2013   INR 1.01 11/15/2012     Non-Invasive Vascular Imaging: pending  ASSESSMENT/PLAN:  ESRD non compliant with consistent dialysis treatment.  She has had previous access in the left upper extremity that failed and is now having problems with her right forearm graft.   We will order vein mapping to see if she has any further options in her upper extremities, if not she may need a thigh graft?  Her current WBC is 3.3 and she is HIV positive we would also need to consider this  during our planning.    IR will place temp. Catheter.   Clinton GallantCOLLINS, EMMA [facility] 12/13/2013 11:34 AM  I agree with the above.  The patient will most likely need a right upper arm graft.  However, before proceeding we will check a vein map to make sure that she does not have an adequate vein for fistula creation.  In addition I will need to have a copy of her outpatient fistula studies so that I can make an educated decision on her next option for dialysis.   Durene CalWells Tyronn Golda

## 2013-12-13 NOTE — Consult Note (Signed)
Chief Complaint: Chief Complaint  Patient presents with  . Shortness of Breath  . Cough  . Hemoptysis  clotted Rt arm dialysis graft Need for dialysis access  Referring Physician(s):  Dr Kathrene Bongo  History of Present Illness: Bethany [name] is a 53 y.o. female  Pt dialyzed yesterday with Rt arm dialysis graft without issue Now noted clotted graft Pt has had recent declot on same graft just 10/7 --outside facility Had appt with vascular surgeon to evaluate graft---but has missed appt In need of acces for dialysis MD feels graft needs surgical evaluation Request has been made from Dr Kathrene Bongo for IR consult for placement of tunneled hemodialysis catheter. Dr Loreta Ave has reviewed chart and imaging; talked with Renal PA and feels pt is appropriate candidate for [facility] Cath placement Received Hep inj this am at 545 am  Past Medical History  Diagnosis Date  . HIV disease dx'd 2000    Diagnosed on 2000, on HIV meds.  On ART, viral levels undetectable per pt.     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    . Hypertension 09/29/2012  . ESRD on hemodialysis since 2006    ESRD due to HTN.  Started HD in [place] in 2006. She moved to [place] and received dialysis with [facility] starting in [facility].  M[facility] in August [facility] and now gets dialysis at [facility] on [address] on a TTS schedule.  HD access is right forearm AVF.  She had a failed attempt at L arm access in the past that didn't mature.   . Pneumonia "several times"  . Anemia   . History of blood transfusion     "I guess related to my kidneys"  . Seizures X 1    "never dx'd" (12/12/2013)  . Stroke ~ 2008    Past Surgical History  Procedure Laterality Date  . Hernia repair    . Cesarean section  2004  . Umbilical hernia repair  2008?  [name] Tubal ligation  2004   . Av fistula placement Right ~ 2005  . Thrombectomy Right X 3    AVF  . Av fistula placement Left ~ 2005    "tried to put it in my arm but it wouldn't work"  . Eye surgery      "related to high blood pressure"    Allergies: Benadryl and Latex  Medications: Prior to Admission medications   Medication Sig Start Date End Date Taking? Authorizing Provider  abacavir (ZIAGEN) 300 MG tablet Take 1 tablet (300 mg total) by mouth 2 (two) times daily. 03/12/13  Yes Quentin Angst, MD  amLODipine (NORVASC) 10 MG tablet Take 10 mg by mouth daily.   Yes Historical Provider, MD  azithromycin (ZITHROMAX) 250 MG tablet Take 1 tablet (250 mg total) by mouth daily. Take first 2 tablets together, then 1 every day until finished. 12/10/13  Yes Shari A Upstill, PA-C  benzonatate (TESSALON PERLES) 100 MG capsule Take 1 capsule (100 mg total) by mouth 3 (three) times daily as needed for cough. 11/17/12  Yes Penny Pia, MD  cinacalcet (SENSIPAR) 30 MG tablet Take 30 mg by mouth at bedtime.   Yes Historical Provider, MD  etravirine (INTELENCE) 100 MG tablet Take 2 tablets (200 mg total) by mouth 2 (two) times daily with a meal. 03/12/13  Yes Quentin Angstlugbemiga E Jegede, MD  isosorbide-hydrALAZINE (BIDIL) 20-37.5 MG per tablet Take 1 tablet by mouth 2 (two) times daily. 08/16/13  Yes Vassie Lollarlos Madera, MD  metoprolol succinate (TOPROL-XL) 25 MG 24 hr tablet Take 25 mg by mouth daily.   Yes Historical Provider, MD  multivitamin (RENA-VIT) TABS tablet Take 1 tablet by mouth daily. 10/02/12  Yes Zetta BillsJay Patel, MD  Nutritional Supplements (FEEDING SUPPLEMENT, NEPRO CARB STEADY,) LIQD Take 237 mLs by mouth daily. 08/16/13  Yes Vassie Lollarlos Madera, MD  ondansetron (ZOFRAN) 4 MG tablet Take 1 tablet (4 mg total) by mouth every 8 (eight) hours as needed for nausea or vomiting. 08/12/13  Yes Ambrose FinlandValerie A Keck, NP  pantoprazole (PROTONIX) 40 MG tablet Take 1 tablet (40 mg total) by mouth daily. 08/16/13  Yes Vassie Lollarlos Madera, MD  raltegravir  (ISENTRESS) 400 MG tablet Take 400 mg by mouth 2 (two) times daily.   Yes Historical Provider, MD  sertraline (ZOLOFT) 25 MG tablet Take 50 mg by mouth daily.    Yes Historical Provider, MD  temazepam (RESTORIL) 15 MG capsule Take 15 mg by mouth at bedtime as needed for sleep.   Yes Historical Provider, MD  tenofovir (VIREAD) 300 MG tablet Take 300 mg by mouth every 7 (seven) days. Patient takes on saturday 03/12/13  Yes Quentin Angstlugbemiga E Jegede, MD  tetrahydrozoline 0.05 % ophthalmic solution Place 2 drops into both eyes daily as needed (for dry eyes).   Yes Historical Provider, MD    Family History  Problem Relation Age of Onset  . Diabetes Mother   . Hypertension Mother   . Hypertension Sister   . Diabetes Sister     History   Social History  . Marital Status: Married    Spouse Name: N/A    Number of Children: N/A  . Years of Education: N/A   Social History Main Topics  . Smoking status: Never Smoker   . Smokeless tobacco: Never Used  . Alcohol Use: No  . Drug Use: No  . Sexual Activity: Not Currently   Other Topics Concern  . None   Social History Narrative  . None    Review of Systems: A 12 point ROS discussed and pertinent positives are indicated in the HPI above.  All other systems are negative.  Review of Systems  Constitutional: Positive for fatigue. Negative for activity change and appetite change.  Respiratory: Positive for cough and shortness of breath. Negative for wheezing.   Cardiovascular: Negative for chest pain.  Psychiatric/Behavioral: Negative for confusion.    Vital Signs: BP 160/118  Pulse 71  Temp(Src) 99.2 F (37.3 C) (Oral)  Resp 18  Ht 5\' 2"  (1.575 m)  Wt 43.817 kg (96 lb 9.6 oz)  BMI 17.66 kg/m2  SpO2 100%  Physical Exam  Constitutional: She is oriented to person, place, and time.  Cardiovascular: Normal rate, regular rhythm and normal heart sounds.   Pulmonary/Chest: Effort normal. She has wheezes.  Abdominal: Soft. Bowel sounds are  normal. There is no tenderness.  Musculoskeletal: Normal range of motion.  Neurological: She is alert and oriented to person, place, and time.  Skin: Skin is warm and dry.  Psychiatric: She has a normal mood and affect. Her behavior is normal. Judgment and thought content normal.    Imaging: Dg Chest 2 View  12/12/2013   CLINICAL DATA:  Two day history of cough and difficulty breathing ; end-stage renal disease; hypertension ; HIV disease  EXAM: CHEST  2 VIEW  COMPARISON:  February 10, 2014  FINDINGS: There has been interval clearing of interstitial edema. Currently, the lungs are clear. The heart is enlarged with pulmonary vascularity. No adenopathy. No focal bone lesions. There is atherosclerotic change in the aorta.  IMPRESSION: Interval clearing of edema. Currently lungs clear. Heart remains enlarged with pulmonary vascularity within normal limits. No apparent adenopathy.   Electronically Signed   By: Bretta BangWilliam  Woodruff M.D.   On: 12/12/2013 16:37   Dg Chest 2 View  12/11/2013   CLINICAL DATA:  Shortness of breath with productive cough.  EXAM: CHEST  2 VIEW  COMPARISON:  08/14/2013  FINDINGS: There is chronic marked cardiomegaly. Stable aortic contours and pulmonary arterial prominence. There is bronchial cuffing, perihilar airspace opacity, fissural thickening, and diffuse interstitial coarsening. No effusion or pneumothorax.  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 No calcified fluid levels.  IMPRESSION: CHF.   Electronically Signed   By: Tiburcio PeaJonathan  Watts M.D.   On: 12/11/2013 05:54    Labs:  CBC:  Recent Labs  12/11/13 0437 12/11/13 0444 12/11/13 0954 12/11/13 1300 12/12/13 0500  WBC 5.2  --  4.5 3.8* 3.3*  HGB 9.2* 10.9* 7.9* 7.4* 8.7*  HCT 29.8* 32.0* 25.0* 23.7* 28.2*  PLT 198  --  140* 137* 142*    COAGS:  Recent Labs  12/11/13 0437  INR 0.97    BMP:  Recent Labs   08/15/13 0503 12/11/13 0437 12/11/13 0444 12/11/13 0954 12/12/13 0500  NA 139 141 138 139 141  K 4.9 5.7* 5.4* 6.2* 4.6  CL 94* 95* 102 95* 100  CO2 29 23  --  20 27  GLUCOSE 83 106* 109* 93 100*  BUN 38* 57* 53* 62* 22  CALCIUM 10.1 10.1  --  9.5 9.5  CREATININE 9.30* 11.11* 11.20* 11.52*  11.44* 5.78*  GFRNONAA 4* 3*  --  3*  3* 8*  GFRAA 5* 4*  --  4*  4* 9*    LIVER FUNCTION TESTS:  Recent Labs  03/23/13 2102  03/25/13 0800 07/10/13 1930 08/14/13 2134 12/11/13 0954  BILITOT 0.5  --   --  0.6 0.7  --   AST 13  --   --  23 19  --   ALT 9  --   --  6 8  --   ALKPHOS 93  --   --  108 80  --   PROT 9.4*  --   --  9.4* 8.0  --   ALBUMIN 3.7  < > 3.1* 4.1 3.4* 3.0*  < > = values in this interval not displayed.  TUMOR MARKERS: No results found for this basename: AFPTM, CEA, CA199, CHROMGRNA,  in the last 8760 hours  Assessment and Plan:  Clotted Rt arm dialysis graft Need for dialysis access Just recently had declot Rt arm graft For surgical evaluation of same Now scheduled for tunneled hemodialysis catheter placement Pt aware of procedure benefits and risks  Wants to talk to husband before signs consent RN to check with pt for consent    Thank you for this interesting consult.  I greatly enjoyed meeting Bethany [name] and look forward to participating in their care.    I spent a total of 30 minutes face to face in clinical consultation, greater than 50% of which was counseling/coordinating care for 30  Signed: Williom Cedar A 12/13/2013, 12:03 PM

## 2013-12-13 NOTE — Procedures (Signed)
Interventional Radiology Procedure Note  Procedure: US/fluoro guided placement of 19cm tip to cuff, split tip HD catheter in the right subclavian vein.  US shows occlusion of the right IJ and left IJ.  Complications: No immediate Recommendations:  - Ok to [employer] - Routine line care   Signed,  Yvone NeuJaime S. Loreta AveWagner, DO

## 2013-12-13 NOTE — Sedation Documentation (Signed)
Pt grimacing with suturing of line

## 2013-12-14 DIAGNOSIS — I5023 Acute on chronic systolic (congestive) heart failure: Secondary | ICD-10-CM

## 2013-12-14 DIAGNOSIS — E8770 Fluid overload, unspecified: Secondary | ICD-10-CM

## 2013-12-14 LAB — RENAL FUNCTION PANEL
Albumin: 3 g/dL — ABNORMAL LOW (ref 3.5–5.2)
Anion gap: 15 (ref 5–15)
BUN: 31 mg/dL — ABNORMAL HIGH (ref 6–23)
CO2: 27 mEq/L (ref 19–32)
Calcium: 9 mg/dL (ref 8.4–10.5)
Chloride: 91 mEq/L — ABNORMAL LOW (ref 96–112)
Creatinine, Ser: 7.24 mg/dL — ABNORMAL HIGH (ref 0.50–1.10)
GFR calc Af Amer: 7 mL/min — ABNORMAL LOW (ref >90)
GFR calc non Af Amer: 6 mL/min — ABNORMAL LOW (ref >90)
Glucose, Bld: 95 mg/dL (ref 70–99)
Phosphorus: 5.8 mg/dL — ABNORMAL HIGH (ref 2.3–4.6)
Potassium: 4.2 mEq/L (ref 3.7–5.3)
Sodium: 133 mEq/L — ABNORMAL LOW (ref 137–147)

## 2013-12-14 LAB — CBC
HCT: 35.3 % — ABNORMAL LOW (ref 36.0–46.0)
Hemoglobin: 11.2 g/dL — ABNORMAL LOW (ref 12.0–15.0)
MCH: 28.9 pg (ref 26.0–34.0)
MCHC: 31.7 g/dL (ref 30.0–36.0)
MCV: 91.2 fL (ref 78.0–100.0)
PLATELETS: 142 10*3/uL — AB (ref 150–400)
RBC: 3.87 MIL/uL (ref 3.87–5.11)
RDW: 18.9 % — ABNORMAL HIGH (ref 11.5–15.5)
WBC: 3 10*3/uL — ABNORMAL LOW (ref 4.0–10.5)

## 2013-12-14 MED ORDER — TENOFOVIR DISOPROXIL FUMARATE 300 MG PO TABS: 300.0000 mg | ORAL_TABLET | ORAL | Status: AC

## 2013-12-14 MED ORDER — SERTRALINE HCL 25 MG PO TABS: 50.0000 mg | ORAL_TABLET | Freq: Every day | ORAL | Status: AC

## 2013-12-14 MED ORDER — RALTEGRAVIR POTASSIUM 400 MG PO TABS: 400.0000 mg | ORAL_TABLET | Freq: Two times a day (BID) | ORAL | Status: AC

## 2013-12-14 MED ORDER — SULFAMETHOXAZOLE-TMP DS 800-160 MG PO TABS: 1.0000 | ORAL_TABLET | ORAL | Status: AC

## 2013-12-14 MED ORDER — ETRAVIRINE 100 MG PO TABS: 200.0000 mg | ORAL_TABLET | Freq: Two times a day (BID) | ORAL | Status: AC

## 2013-12-14 MED ORDER — METOPROLOL SUCCINATE ER 25 MG PO TB24: 25.0000 mg | ORAL_TABLET | Freq: Every day | ORAL | Status: AC

## 2013-12-14 MED ORDER — CINACALCET HCL 30 MG PO TABS: 30.0000 mg | ORAL_TABLET | Freq: Every day | ORAL | Status: AC

## 2013-12-14 MED ORDER — ABACAVIR SULFATE 300 MG PO TABS: 300.0000 mg | ORAL_TABLET | Freq: Two times a day (BID) | ORAL | Status: AC

## 2013-12-14 MED ORDER — ISOSORB DINITRATE-HYDRALAZINE 20-37.5 MG PO TABS: 1.0000 | ORAL_TABLET | Freq: Two times a day (BID) | ORAL | Status: AC

## 2013-12-14 NOTE — Progress Notes (Signed)
Subjective:  Still with productive cough, but feels much better, anticipating discharge  Objective: Vital signs in last 24 hours: Temp:  [98.1 F (36.7 C)-99.2 F (37.3 C)] 98.3 F (36.8 C) (10/17 0623) Pulse Rate:  [62-72] 62 (10/17 0623) Resp:  [13-27] 17 (10/17 0623) BP: (112-163)/(78-128) 152/94 mmHg (10/17 0623) SpO2:  [98 %-100 %] 100 % (10/17 0623) Weight:  [42.865 kg (94 lb 8 oz)] 42.865 kg (94 lb 8 oz) (10/16 2140) Weight change: -2.935 kg (-6 lb 7.5 oz)  Intake/Output from previous day: 10/16 0701 - 10/17 0700 In: 123 [P.O.:120; I.V.:3] Out: -  Intake/Output this shift:   Lab Results:  Recent Labs  12/11/13 1300 12/12/13 0500  WBC 3.8* 3.3*  HGB 7.4* 8.7*  HCT 23.7* 28.2*  PLT 137* 142*   BMET:  Recent Labs  12/11/13 0954 12/12/13 0500  NA 139 141  K 6.2* 4.6  CL 95* 100  CO2 20 27  GLUCOSE 93 100*  BUN 62* 22  CREATININE 11.52*  11.44* 5.78*  CALCIUM 9.5 9.5  ALBUMIN 3.0*  --    No results found for this basename: PTH,  in the last 72 hours Iron Studies: No results found for this basename: IRON, TIBC, TRANSFERRIN, FERRITIN,  in the last 72 hours  Studies/Results: Ir Fluoro Guide Cv Line Right  12/13/2013   CLINICAL DATA:  53 year old female with a history of chronic renal disease and need for hemodialysis. Right-sided loop graft has been thrombosed. She has been referred for tunneled catheter placement.  EXAM: IR RIGHT FLOURO GUIDE CV LINE; IR ULTRASOUND GUIDANCE VASC ACCESS RIGHT  Date: 12/13/2013  ANESTHESIA/SEDATION: Moderate (conscious) sedation was administered during this procedure. A total of 2.0 mg Versed and 100 mg Fentanyl were administered intravenously. The patient's vital signs were monitored continuously by radiology nursing throughout the course of the procedure.  Total sedation time: 30 minutes  FLUOROSCOPY TIME:  1 min, 24 seconds minutes  TECHNIQUE: A complete informed consent was provided, with all the risks and benefits discussed  with the patient before proceeding. The patient understands risks and benefits and wishes to proceed.  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  The right upper chest was prepped with chlorhexidine, and draped in the usual sterile fashion using maximum barrier technique (cap and mask, sterile gown, sterile gloves, large sterile sheet, hand hygiene and cutaneous antiseptic). Local anesthesia was attained by infiltration with 1% lidocaine without epinephrine.  Ultrasound demonstrated patency of the right subclavian vein, and this was documented with an image. Under real-time ultrasound guidance, this vein was accessed with a 21 gauge micropuncture needle and image documentation was performed. A small dermatotomy was made at the access site with an 11 scalpel. A 0.018" wire was advanced into the SVC and the access needle exchanged for a 22F micropuncture vascular sheath. The 0.018" wire was then removed and a 0.035" wire advanced into the IVC.  An appropriate location for the tunneled catheter was selected below the clavicle and an incision was made through the skin and underlying soft tissues. The skin and subcutaneous tissues below the right clavicle were generously infiltrated with 1% lidocaine. A small stab incision with an 11 blade scalpel was made, and a 19 cm tip to cuff split tip hemodialysis catheter was back tunneled to the puncture incision site.  The venous access site was then serially dilated and a  peel away vascular sheath placed over the wire. The wire was removed and the port catheter advanced into position under fluoroscopic guidance. The catheter tip is positioned in the right atrium. This was documented with a spot image. Excellent flow both the red and blue port was confirmed, and the red and blue ports were flushed with heparinized saline lock. A retention  suture was placed in each of the wings. Dermabond was used to seal the puncture site.  Patient tolerated procedure well and remained hemodynamically stable throughout.  No complications were encountered and no significant blood loss was encountered.  COMPLICATIONS: None.  The patient tolerated the procedure well.  IMPRESSION: Status post placement of tunneled 19 cm tip to cuff split tip hemodialysis catheter is in the right subclavian vein. Catheter is ready for use.  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  Signed,  Yvone NeuJaime S. Loreta AveWagner, DO  [facility]  [facility]   Electronically Signed   By: Gilmer MorJaime  Wagner D.O.   On: 12/13/2013 18:09   Ir [place] Guide Vasc Access Right  12/13/2013   CLINICAL DATA:  53 year old female with a history of chronic renal disease and need for hemodialysis. Right-sided loop graft has been thrombosed. She has been referred for tunneled catheter placement.  EXAM: IR RIGHT FLOURO GUIDE CV LINE; IR ULTRASOUND GUIDANCE VASC ACCESS RIGHT  Date: 12/13/2013  ANESTHESIA/SEDATION: Moderate (conscious) sedation was administered during this procedure. A total of 2.0 mg Versed and 100 mg Fentanyl were administered intravenously. The patient's vital signs were monitored continuously by radiology nursing throughout the course of the procedure.  Total sedation time: 30 minutes  FLUOROSCOPY TIME:  1 min, 24 seconds minutes  TECHNIQUE: A complete informed consent was provided, with all the risks and benefits discussed with the patient before proceeding. The patient understands risks and benefits and wishes to proceed.  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  The right upper chest was prepped with chlorhexidine, and draped in the usual sterile fashion using maximum barrier technique  (cap and mask, sterile gown, sterile gloves, large sterile sheet, hand hygiene and cutaneous antiseptic). Local anesthesia was attained by infiltration with 1% lidocaine without epinephrine.  Ultrasound demonstrated patency of the right subclavian vein, and this was documented with an image. Under real-time ultrasound guidance, this vein was accessed with a 21 gauge micropuncture needle and image documentation was performed. A small dermatotomy was made at the access site with an 11 scalpel. A 0.018" wire was advanced into the SVC and the access needle exchanged for a 67F micropuncture vascular sheath. The 0.018" wire was then removed and a 0.035" wire advanced into the IVC.  An appropriate location for the tunneled catheter was selected below the clavicle and an incision was made through the skin and underlying soft tissues. The skin and subcutaneous tissues below the right clavicle were generously infiltrated with 1% lidocaine. A small stab incision with an 11 blade scalpel was made, and a 19 cm tip to cuff split tip hemodialysis catheter was back tunneled to the puncture incision site.  The venous access site was then serially dilated and a peel away vascular sheath placed over the wire. The wire was removed and the port catheter advanced into position under fluoroscopic guidance. The catheter tip is positioned in the right atrium. This was documented with a  spot image. Excellent flow both the red and blue port was confirmed, and the red and blue ports were flushed with heparinized saline lock. A retention suture was placed in each of the wings. Dermabond was used to seal the puncture site.  Patient tolerated procedure well and remained hemodynamically stable throughout.  No complications were encountered and no significant blood loss was encountered.  COMPLICATIONS: None.  The patient tolerated the procedure well.  IMPRESSION: Status post placement of tunneled 19 cm tip to cuff split tip hemodialysis catheter is  in the right subclavian vein. Catheter is ready for use.  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  Signed,  Yvone NeuJaime S. Loreta AveWagner, DO  [facility]  [facility]   Electronically Signed   By: Gilmer MorJaime  Wagner D.O.   On: 12/13/2013 18:09   EXAM:  General appearance: Alert, in no apparent distress  Resp: CTA without rales, rhonchi, or wheezes  Cardio: RRR without murmur or rub  GI: + BS, soft and nontender  Extremities: No edema  Access: R SV catheterAVG @ RFA without bruit or thrill    Dialysis Orders: [facility]  4 hrs 46.5 kg 2K/2Ca 450/A1.5 Heparin 3000 U AVG @ RFA  Hectorol 7 mcg Aranesp 200 mcg & Venofer 50 mg   Assessment/Plan: 1. Dyspnea / cough - CHF on CXR, 3 HD Txs in 2 wks prior to admission, no antibiotics, improved s/p HD 10/14 & 15.  2. Clotted access - clotted LUA AVG, R subclavian vein catheter placed by IR yesterday, outpatient follow-up with [facility] next week for new access. 3. ESRD - [place] [place], last HD before admission for 2:43 on 10/10; K 4.6.  HD pending today with new catheter. Will need HD before discharge just to make sure cathter works 4. HTN/volume - BP 152/94 on Bidil 20/37.5 mg qd, Metoprolol 25 mg qd; I stopped her amlodipine so could lower EDW more wt 42.9 kg, below EDW. Will need lower EDW as OP 5. Anemia - Hgb 8.7, Aranesp 200 mcg & Fe on Thurs, received both 10/14. Would come up if she cam to HD to get her ESA 6. 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  7. Nutrition - Alb 3.7, renal diet.  8. HIV - anitvirals.   LOS: 3 days   LYLES,CHARLES 12/14/2013,9:37 AM  Patient seen and examined, agree with above note with above modifications. She continues to look good- needs HD here before discharge likely today- work on getting new access as OP  Annie SableKellie Trindon Dorton, MD 12/14/2013

## 2013-12-14 NOTE — Progress Notes (Signed)
Pt discharged home. Discharge instructions provided. NSL removed. Daughter escorted pt out in wheelchair. Hortencia ConradiWendi Arella Blinder, RN

## 2013-12-14 NOTE — Progress Notes (Signed)
Please try to obtain a copy of the outpatient imaging studies that were performed from her most recent thrombolysis study, which were done at the outpatient renal center, so I can evaluate the venous system in her right upper arm.  Thanks you   Bethany [name] Bethany [name]

## 2013-12-14 NOTE — Discharge Summary (Signed)
Physician Discharge Summary  Bethany [name]:[id number]:[id number] DOB: Aug 19, 1960 DOA: 12/11/2013  PCP: Jeanann LewandowskyJEGEDE, OLUGBEMIGA, MD  Admit date: 12/11/2013 Discharge date: 12/14/2013  Time spent: greater than 30 minutes  Recommendations for Outpatient Follow-up:  1.   Discharge Diagnoses:  Principal Problem:   Fluid overload due to nonadherance with outpatient dialysis Active Problems:   ESRD on hemodialysis, with multiple missed sessions AIDS   Hypertension   Systolic CHF, acute on chronic   Discharge Condition: stable  Filed Weights   12/12/13 2042 12/13/13 2140 12/14/13 1216  Weight: 43.817 kg (96 lb 9.6 oz) 42.865 kg (94 lb 8 oz) 36.6 kg (80 lb 11 oz)    History of present illness:  53 y.o. female with a past medical history of end-stage renal disease, currently on hemodialysis on Tuesdays Thursdays and Saturdays, HIV, systolic heart failure, hypertension, presenting to the emergency room with complaints of cough and shortness of breath. She reports having worsening shortness of breath over the past 24 hours that is associated with cough having yellowish sputum production and sore throat. She states that symptoms started about a week ago. She was seen in the emergency room yesterday for similar complaints and discharged in stable condition on a Z-Pack. Unfortunately she missed her dialysis session yesterday. She denies recent travel, sick contacts, fevers, chills, chest pain, syncope, dizziness, palpitations or extremity edema. Workup in the emergency room included a chest x-ray which showed findings suggestive of CHF. There were no obvious infiltrates seen on exam.  Hospital Course:  Admitted to hospitalists. Nephrology consulted and patient dialyzed. None further. 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 Last echo showed EF 45-50% and  diastolic dysfunction.  Much improved by discharge End-stage renal disease on hemodialysis. Patient on hemodialysis Tuesdays Thursdays and Saturdays. Access clotted. permcath placed by IR. VVS consulted and will work on alternate permanent access as outpatient Hypertension. Improved with dialysis HIV. Per medical records her last CD4 count was 220 on 11/16/2012. Continue HIV regimen- ? Compliance. CD4 count now 70. Will need PCP prophylaxis. Discussed with ID, patient no-showed in the clinic. Advised pt to continue meds and treatments as prescribed, [facility]. Started bactrim 3 times weekly for PCP prophylaxis. Elevated troponin no evidence of ACS By discharge feeling much better and stable for discharge home  Procedures: Hemodialysis permcath  Consultations:  Nephrology  IR  Vascular surgery  Discharge Exam: Filed Vitals:   12/14/13 1530  BP: 115/68  Pulse: 68  Temp:   Resp:     General: a, o and comfortable Cardiovascular: RRR Respiratory: CTA Abd: s, nt, nd Ext no CCE   Discharge Instructions   Activity as tolerated - No restrictions    Complete by:  As directed      Discharge instructions    Complete by:  As directed   renal          Current Discharge Medication List    START taking these medications   Details  sulfamethoxazole-trimethoprim (BACTRIM DS) 800-160 MG per tablet Take 1 tablet by mouth 3 (three) times a week. On M-W-F Qty: 12 tablet, Refills: 1      CONTINUE these medications which have CHANGED   Details  abacavir (ZIAGEN) 300 MG tablet Take 1 tablet (300 mg total) by mouth 2 (two) times daily. Qty: 60 tablet, Refills: 1   Associated Diagnoses: HIV disease    cinacalcet (SENSIPAR) 30  MG tablet Take 1 tablet (30 mg total) by mouth at bedtime. Qty: 60 tablet, Refills: 1    etravirine (INTELENCE) 100 MG tablet Take 2 tablets (200 mg total) by mouth 2 (two) times daily with a meal. Qty: 60 tablet, Refills: 1   Associated  Diagnoses: HIV disease    isosorbide-hydrALAZINE (BIDIL) 20-37.5 MG per tablet Take 1 tablet by mouth 2 (two) times daily. Qty: 60 tablet, Refills: 1    metoprolol succinate (TOPROL-XL) 25 MG 24 hr tablet Take 1 tablet (25 mg total) by mouth daily. Qty: 60 tablet, Refills: 1    raltegravir (ISENTRESS) 400 MG tablet Take 1 tablet (400 mg total) by mouth 2 (two) times daily. Qty: 60 tablet, Refills: 1    sertraline (ZOLOFT) 25 MG tablet Take 2 tablets (50 mg total) by mouth daily. Qty: 30 tablet, Refills: 1    tenofovir (VIREAD) 300 MG tablet Take 1 tablet (300 mg total) by mouth every 7 (seven) days. Patient takes on saturday Qty: 4 tablet, Refills: 1      CONTINUE these medications which have NOT CHANGED   Details  benzonatate (TESSALON PERLES) 100 MG capsule Take 1 capsule (100 mg total) by mouth 3 (three) times daily as needed for cough. Qty: 20 capsule, Refills: 0    multivitamin (RENA-VIT) TABS tablet Take 1 tablet by mouth daily. Qty: 90 tablet, Refills: 0    Nutritional Supplements (FEEDING SUPPLEMENT, NEPRO CARB STEADY,) LIQD Take 237 mLs by mouth daily. Refills: 0    ondansetron (ZOFRAN) 4 MG tablet Take 1 tablet (4 mg total) by mouth every 8 (eight) hours as needed for nausea or vomiting. Qty: 30 tablet, Refills: 1   Associated Diagnoses: Nausea with vomiting    pantoprazole (PROTONIX) 40 MG tablet Take 1 tablet (40 mg total) by mouth daily. Qty: 30 tablet, Refills: 1    temazepam (RESTORIL) 15 MG capsule Take 15 mg by mouth at bedtime as needed for sleep.    tetrahydrozoline 0.05 % ophthalmic solution Place 2 drops into both eyes daily as needed (for dry eyes).      STOP taking these medications     amLODipine (NORVASC) 10 MG tablet      azithromycin (ZITHROMAX) 250 MG tablet        Allergies  Allergen Reactions  . Benadryl [Diphenhydramine Hcl] Other (See Comments)    Makes patient jittery  . Latex Hives and Itching   Follow-up Information   Call  Jorge Ny, MD. (for hemodialysis access)    Specialty:  Vascular Surgery   Contact information:   [address] [place] [address] [phone]       Follow up with [facility]. (for HIV)    Contact information:   [address] [place] [address]        The results of significant diagnostics from this hospitalization (including imaging, microbiology, ancillary and laboratory) are listed below for reference.    Significant Diagnostic Studies: Dg Chest 2 View  12/12/2013   CLINICAL DATA:  Two day history of cough and difficulty breathing ; end-stage renal disease; hypertension ; HIV disease  EXAM: CHEST  2 VIEW  COMPARISON:  February 10, 2014  FINDINGS: There has been interval clearing of interstitial edema. Currently, the lungs are clear. The heart is enlarged with pulmonary vascularity. No adenopathy. No focal bone lesions. There is atherosclerotic change in the aorta.  IMPRESSION: Interval clearing of edema. Currently lungs clear. Heart remains enlarged  with pulmonary vascularity within normal limits. No apparent adenopathy.   Electronically Signed   By: Bretta Bang M.D.   On: 12/12/2013 16:37   Dg Chest 2 View  12/11/2013   CLINICAL DATA:  Shortness of breath with productive cough.  EXAM: CHEST  2 VIEW  COMPARISON:  08/14/2013  FINDINGS: There is chronic marked cardiomegaly. Stable aortic contours and pulmonary arterial prominence. There is bronchial cuffing, perihilar airspace opacity, fissural thickening, and diffuse interstitial coarsening. No effusion or pneumothorax.  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 No calcified fluid levels.  IMPRESSION: CHF.   Electronically Signed   By: Tiburcio Pea M.D.   On: 12/11/2013 05:54   Ir Fluoro Guide Cv Line Right  12/13/2013   CLINICAL DATA:  53 year old female with a history of chronic renal  disease and need for hemodialysis. Right-sided loop graft has been thrombosed. She has been referred for tunneled catheter placement.  EXAM: IR RIGHT FLOURO GUIDE CV LINE; IR ULTRASOUND GUIDANCE VASC ACCESS RIGHT  Date: 12/13/2013  ANESTHESIA/SEDATION: Moderate (conscious) sedation was administered during this procedure. A total of 2.0 mg Versed and 100 mg Fentanyl were administered intravenously. The patient's vital signs were monitored continuously by radiology nursing throughout the course of the procedure.  Total sedation time: 30 minutes  FLUOROSCOPY TIME:  1 min, 24 seconds minutes  TECHNIQUE: A complete informed consent was provided, with all the risks and benefits discussed with the patient before proceeding. The patient understands risks and benefits and wishes to proceed.  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  The right upper chest was prepped with chlorhexidine, and draped in the usual sterile fashion using maximum barrier technique (cap and mask, sterile gown, sterile gloves, large sterile sheet, hand hygiene and cutaneous antiseptic). Local anesthesia was attained by infiltration with 1% lidocaine without epinephrine.  Ultrasound demonstrated patency of the right subclavian vein, and this was documented with an image. Under real-time ultrasound guidance, this vein was accessed with a 21 gauge micropuncture needle and image documentation was performed. A small dermatotomy was made at the access site with an 11 scalpel. A 0.018" wire was advanced into the SVC and the access needle exchanged for a 69F micropuncture vascular sheath. The 0.018" wire was then removed and a 0.035" wire advanced into the IVC.  An appropriate location for the tunneled catheter was selected below the clavicle and an incision was made through the skin and underlying soft tissues. The skin  and subcutaneous tissues below the right clavicle were generously infiltrated with 1% lidocaine. A small stab incision with an 11 blade scalpel was made, and a 19 cm tip to cuff split tip hemodialysis catheter was back tunneled to the puncture incision site.  The venous access site was then serially dilated and a peel away vascular sheath placed over the wire. The wire was removed and the port catheter advanced into position under fluoroscopic guidance. The catheter tip is positioned in the right atrium. This was documented with a spot image. Excellent flow both the red and blue port was confirmed, and the red and blue ports were flushed with heparinized saline lock. A retention suture was placed in each of the wings. Dermabond was used to seal the puncture site.  Patient tolerated procedure well and remained hemodynamically stable throughout.  No complications were encountered and no significant  blood loss was encountered.  COMPLICATIONS: None.  The patient tolerated the procedure well.  IMPRESSION: Status post placement of tunneled 19 cm tip to cuff split tip hemodialysis catheter is in the right subclavian vein. Catheter is ready for use.  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  Signed,  Yvone NeuJaime S. Loreta AveWagner, DO  [facility]  [facility]   Electronically Signed   By: Gilmer MorJaime  Wagner D.O.   On: 12/13/2013 18:09   Ir [place] Guide Vasc Access Right  12/13/2013   CLINICAL DATA:  53 year old female with a history of chronic renal disease and need for hemodialysis. Right-sided loop graft has been thrombosed. She has been referred for tunneled catheter placement.  EXAM: IR RIGHT FLOURO GUIDE CV LINE; IR ULTRASOUND GUIDANCE VASC ACCESS RIGHT  Date: 12/13/2013  ANESTHESIA/SEDATION: Moderate (conscious) sedation was administered during this procedure. A total of 2.0 mg Versed and 100 mg Fentanyl were administered intravenously. The  patient's vital signs were monitored continuously by radiology nursing throughout the course of the procedure.  Total sedation time: 30 minutes  FLUOROSCOPY TIME:  1 min, 24 seconds minutes  TECHNIQUE: A complete informed consent was provided, with all the risks and benefits discussed with the patient before proceeding. The patient understands risks and benefits and wishes to proceed.  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  The right upper chest was prepped with chlorhexidine, and draped in the usual sterile fashion using maximum barrier technique (cap and mask, sterile gown, sterile gloves, large sterile sheet, hand hygiene and cutaneous antiseptic). Local anesthesia was attained by infiltration with 1% lidocaine without epinephrine.  Ultrasound demonstrated patency of the right subclavian vein, and this was documented with an image. Under real-time ultrasound guidance, this vein was accessed with a 21 gauge micropuncture needle and image documentation was performed. A small dermatotomy was made at the access site with an 11 scalpel. A 0.018" wire was advanced into the SVC and the access needle exchanged for a 82F micropuncture vascular sheath. The 0.018" wire was then removed and a 0.035" wire advanced into the IVC.  An appropriate location for the tunneled catheter was selected below the clavicle and an incision was made through the skin and underlying soft tissues. The skin and subcutaneous tissues below the right clavicle were generously infiltrated with 1% lidocaine. A small stab incision with an 11 blade scalpel was made, and a 19 cm tip to cuff split tip hemodialysis catheter was back tunneled to the puncture incision site.  The venous access site was then serially dilated and a peel away vascular sheath placed over the wire. The wire was removed and the port catheter  advanced into position under fluoroscopic guidance. The catheter tip is positioned in the right atrium. This was documented with a spot image. Excellent flow both the red and blue port was confirmed, and the red and blue ports were flushed with heparinized saline lock. A retention suture was placed in each of the wings. Dermabond was used to seal the puncture site.  Patient tolerated procedure well and remained hemodynamically stable throughout.  No complications were encountered and no significant blood loss was encountered.  COMPLICATIONS: None.  The patient tolerated the procedure well.  IMPRESSION: Status post placement of tunneled 19 cm tip to cuff split tip hemodialysis catheter is in the right subclavian vein.  Catheter is ready for use.  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  Signed,  Yvone Neu. Loreta Ave, DO  [facility]  [facility]   Electronically Signed   By: Gilmer Mor D.O.   On: 12/13/2013 18:09    Microbiology: No results found for this or any previous visit (from the past 240 hour(s)).   Labs: Basic Metabolic Panel:  Recent Labs Lab 12/11/13 0437 12/11/13 0444 12/11/13 0954 12/12/13 0500 12/14/13 0900  NA 141 138 139 141 133*  K 5.7* 5.4* 6.2* 4.6 4.2  CL 95* 102 95* 100 91*  CO2 23  --  20 27 27   GLUCOSE 106* 109* 93 100* 95  BUN 57* 53* 62* 22 31*  CREATININE 11.11* 11.20* 11.52*  11.44* 5.78* 7.24*  CALCIUM 10.1  --  9.5 9.5 9.0  PHOS  --   --  8.8*  --  5.8*   Liver Function Tests:  Recent Labs Lab 12/11/13 0954 12/14/13 0900  ALBUMIN 3.0* 3.0*   No results found for this basename: LIPASE, AMYLASE,  in the last 168 hours No results found for this basename: AMMONIA,  in the last 168 hours CBC:  Recent Labs Lab 12/11/13 0437 12/11/13 0444 12/11/13 0954 12/11/13 1300 12/12/13 0500 12/14/13 0900  WBC 5.2  --  4.5 3.8* 3.3* 3.0*  NEUTROABS 4.1  --   --   --   --    --   HGB 9.2* 10.9* 7.9* 7.4* 8.7* 11.2*  HCT 29.8* 32.0* 25.0* 23.7* 28.2* 35.3*  MCV 90.0  --  88.7 91.5 94.3 91.2  PLT 198  --  140* 137* 142* 142*   Cardiac Enzymes:  Recent Labs Lab 12/11/13 0954 12/11/13 1300 12/11/13 2010  TROPONINI 0.41* 0.43* 0.35*   BNP: BNP (last 3 results)  Recent Labs  03/23/13 2103 08/14/13 2134  PROBNP >70000.0* >70000.0*   CBG: No results found for this basename: GLUCAP,  in the last 168 hours     Signed:  Alegandro Macnaughton L  [facility] 12/14/2013, 3:59 PM

## 2015-06-19 IMAGING — CR DG CHEST 2V
2 series · 2 of 2 positions shown · non-contrast
Comparison: 08/14/2013

CLINICAL DATA: Shortness of breath with productive cough.

EXAM:
CHEST  2 VIEW

[w chest lat]
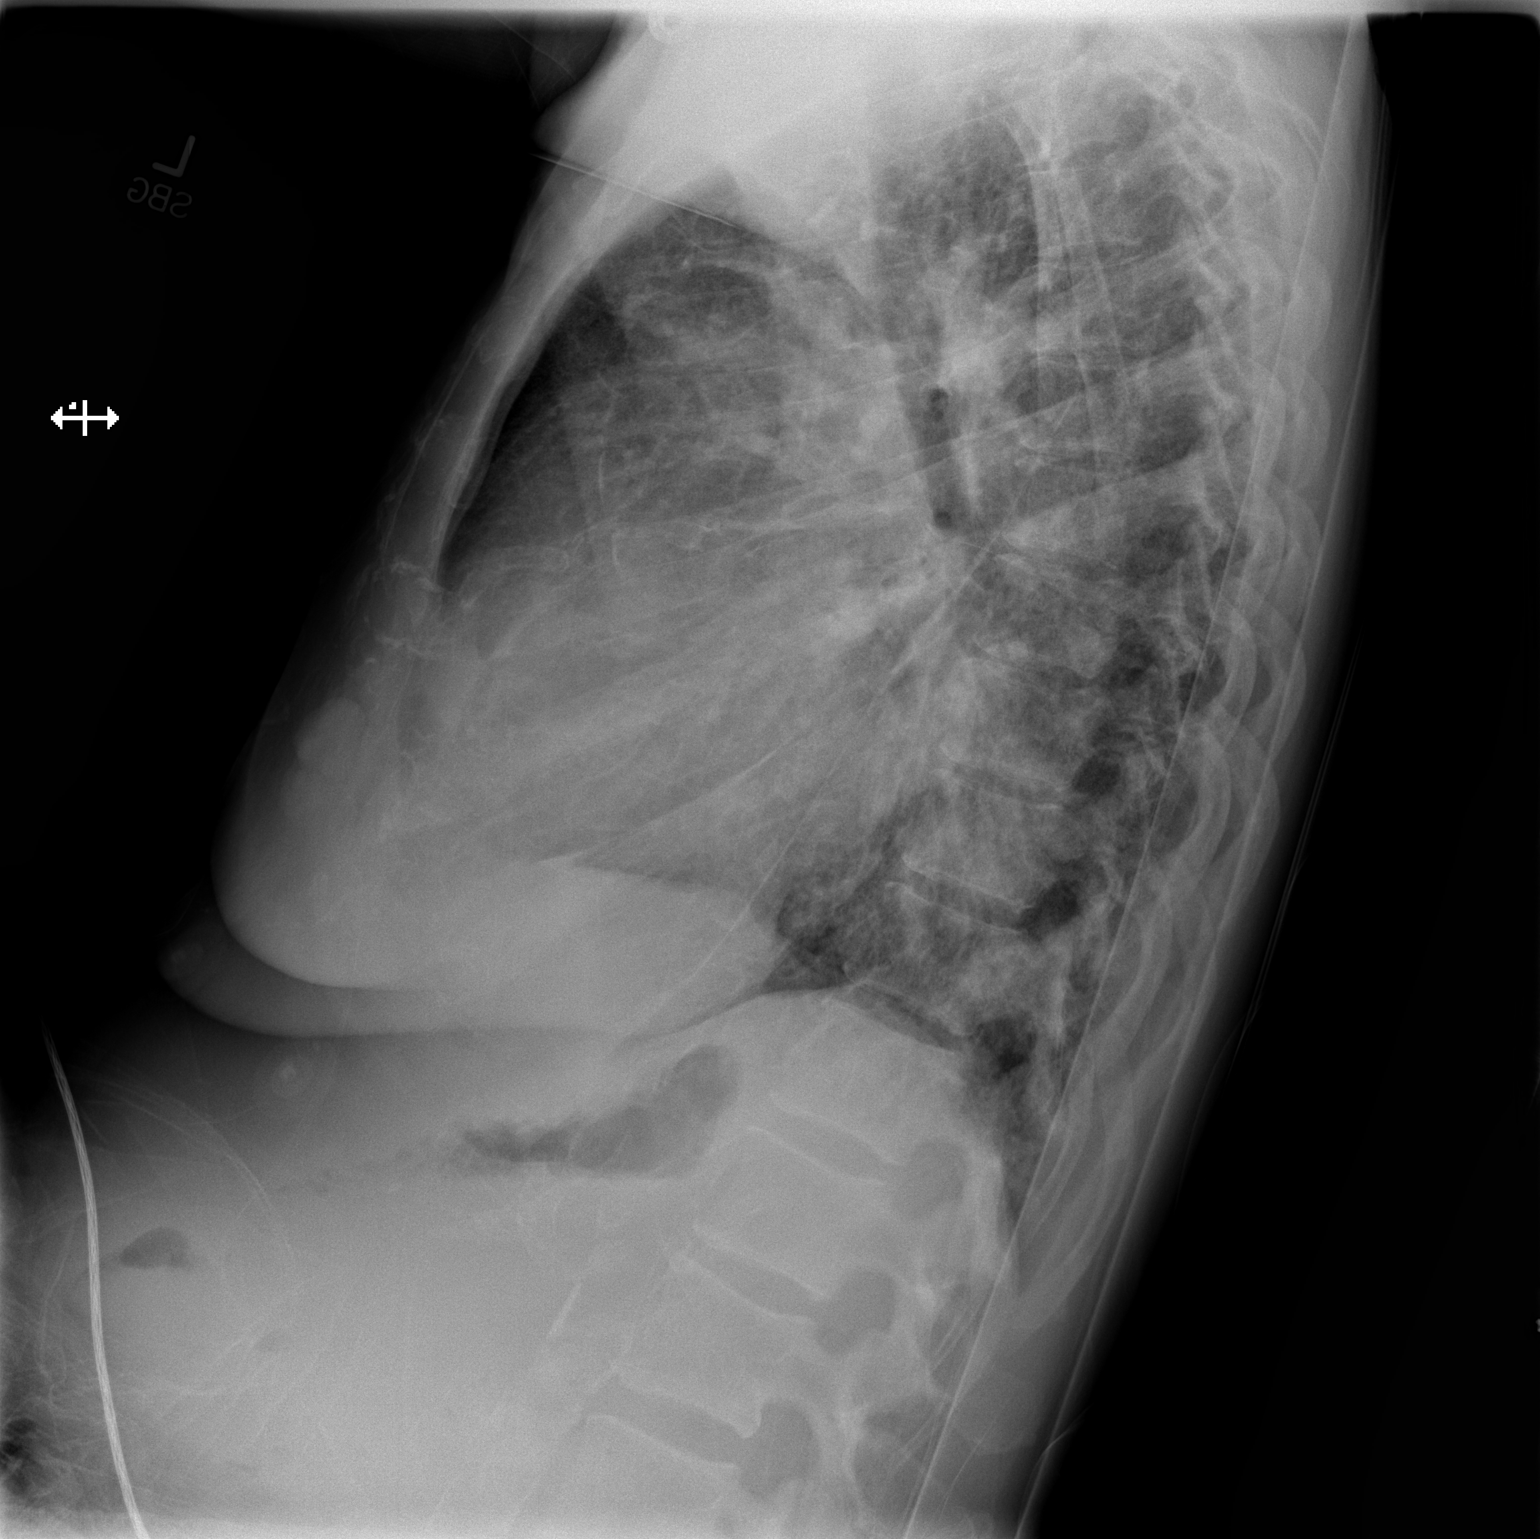

[x chest ap]
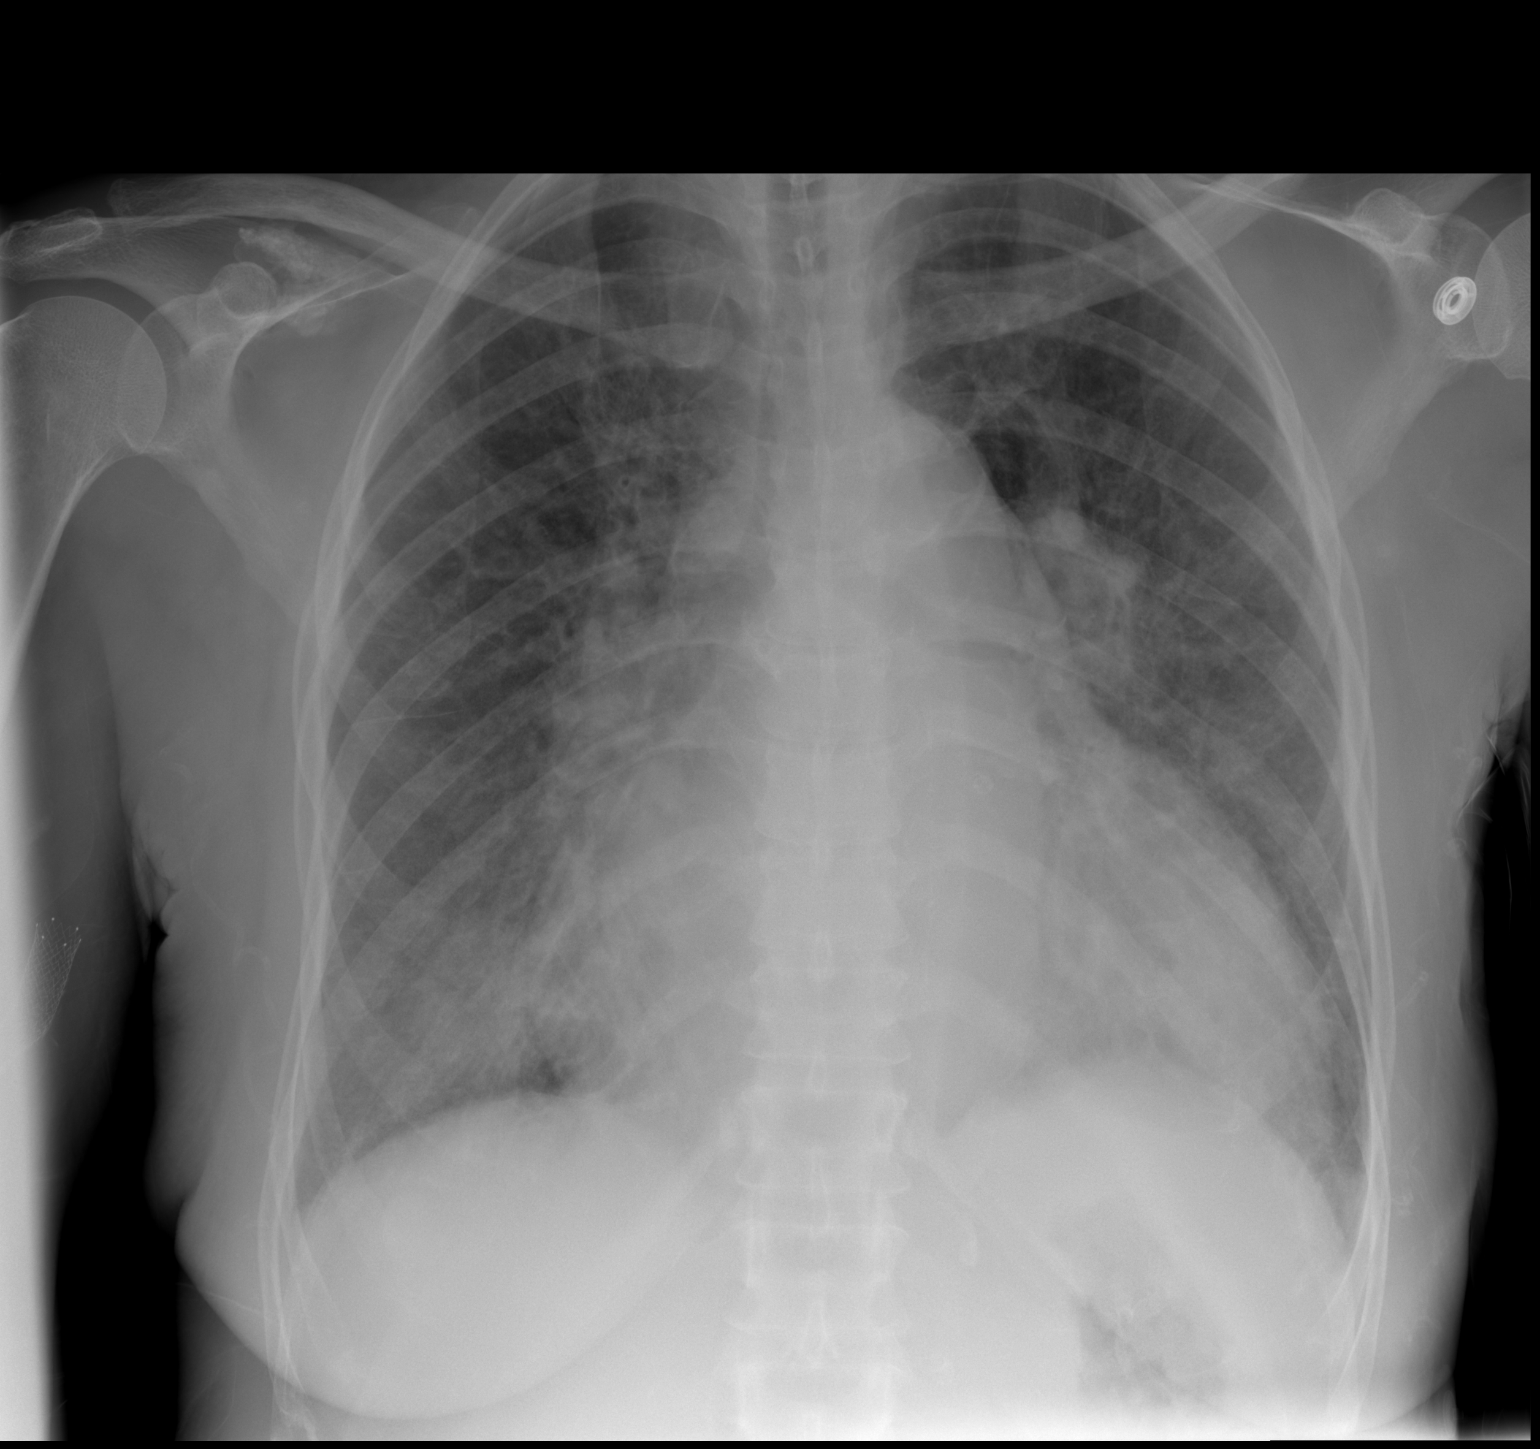

[2 of 2 positions shown; findings below may reference images not displayed]

FINDINGS: There is chronic marked cardiomegaly. Stable aortic contours and
pulmonary arterial prominence. There is bronchial cuffing, perihilar
airspace opacity, fissural thickening, and diffuse interstitial
coarsening. No effusion or pneumothorax.

Amorphous ossification overlapping the right coracoclavicular
ligament which likely reflects posttraumatic heterotopic
ossification. No calcified fluid levels.
IMPRESSION: CHF.

## 2015-06-21 IMAGING — XA IR FLUORO GUIDE CV LINE*R*
1 series · 3 of 3 positions shown · non-contrast
Comparison: none

CLINICAL DATA: 53-year-old female with a history of chronic renal
disease and need for hemodialysis. Right-sided loop graft has been
thrombosed. She has been referred for tunneled catheter placement.
TECHNIQUE: A complete informed consent was provided, with all the risks and
benefits discussed with the patient before proceeding. The patient
understands risks and benefits and wishes to proceed.

[Series 300: ir av dialysis graft declot · 3 of 3 slices shown]
[im 1/3]
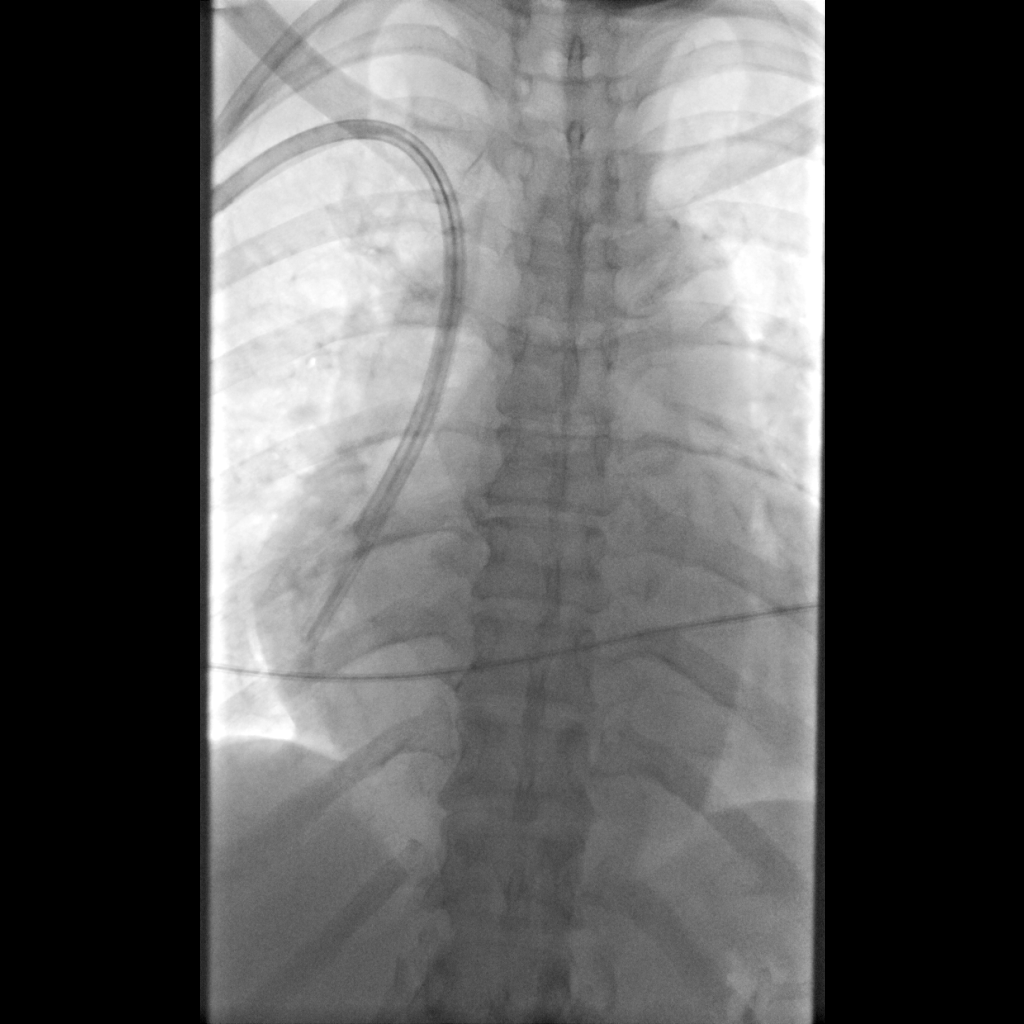
[im 2/3]
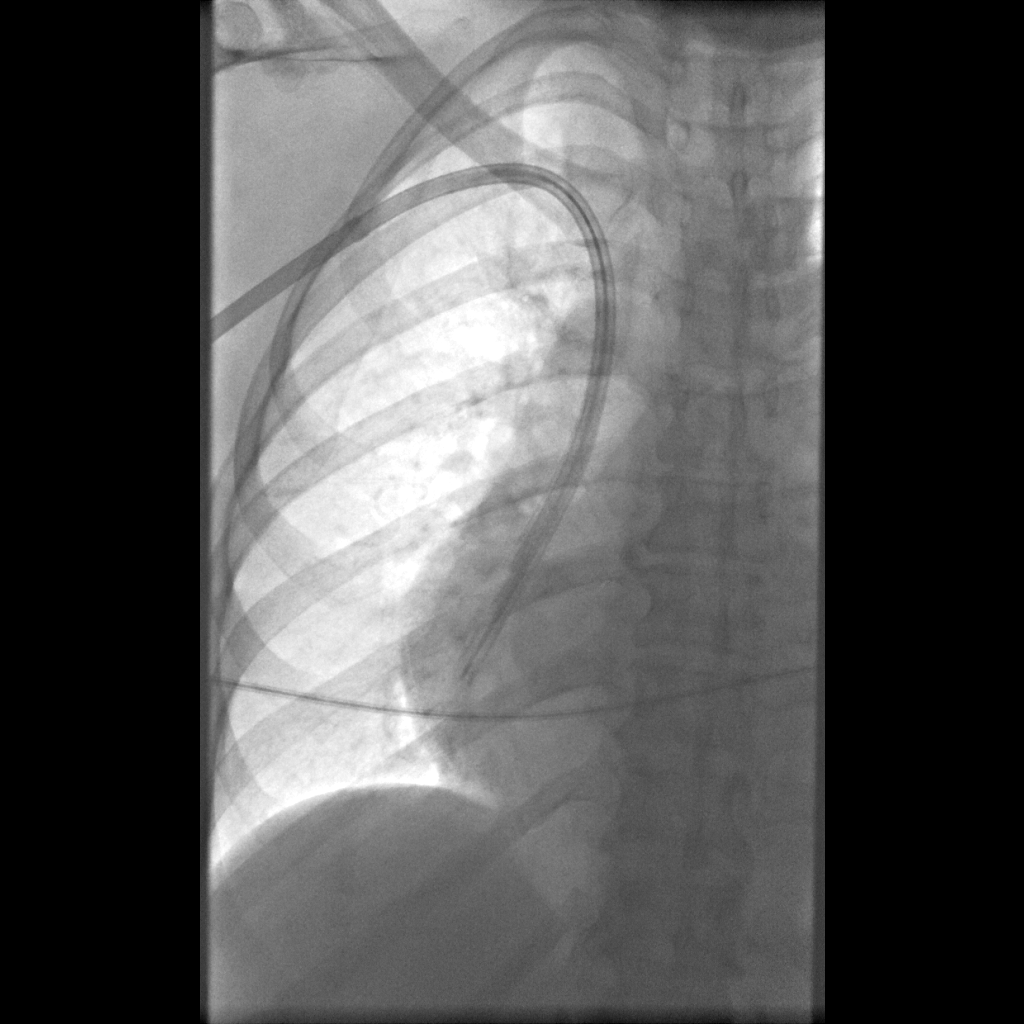
[im 3/3]
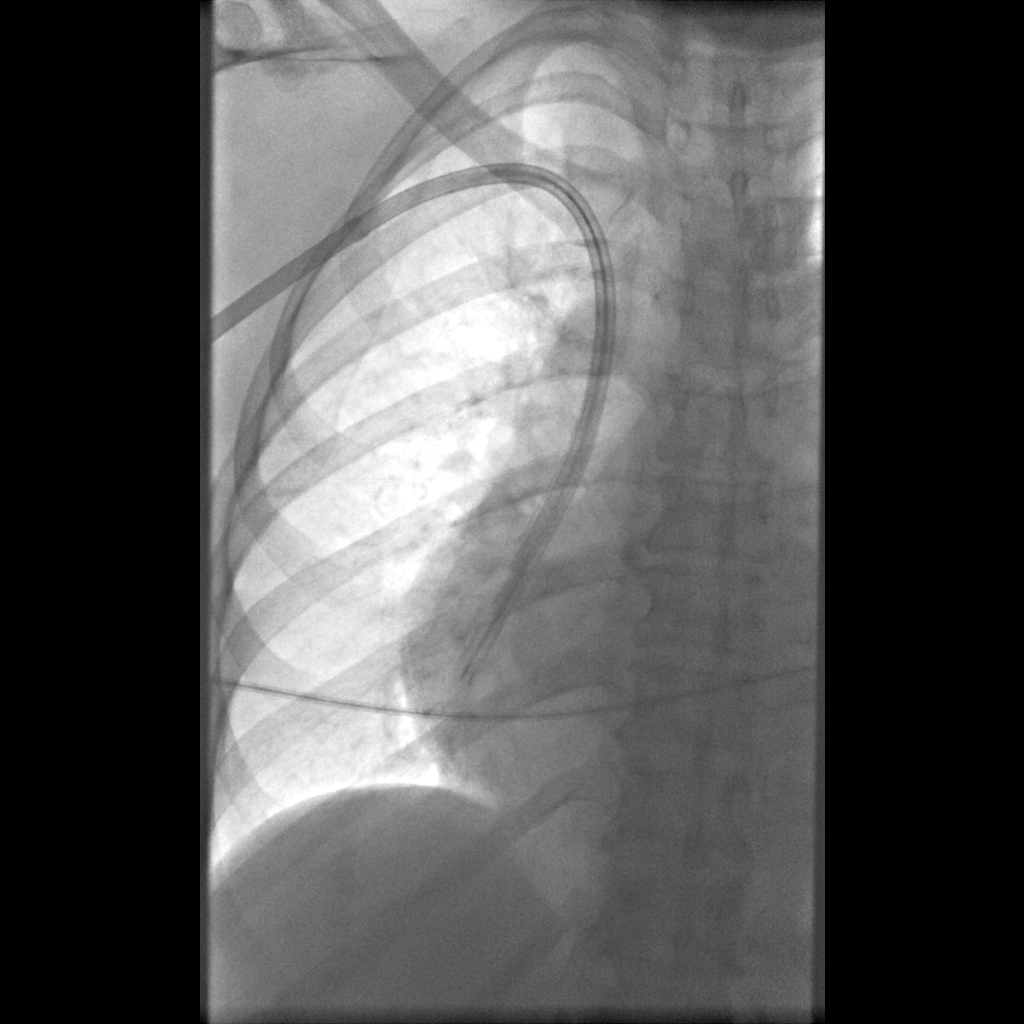

[3 of 3 positions shown; findings below may reference images not displayed]

EXAM:
IR RIGHT FLOURO GUIDE CV LINE; IR ULTRASOUND GUIDANCE VASC ACCESS
RIGHT

Date: 12/13/2013

ANESTHESIA/SEDATION:
Moderate (conscious) sedation was administered during this
procedure. A total of 2.0 mg Versed and 100 mg Fentanyl were
administered intravenously. The patient's vital signs were monitored
continuously by radiology nursing throughout the course of the
procedure.

Total sedation time: 30 minutes

FLUOROSCOPY TIME:  1 min, 24 seconds minutes
Ultrasound survey of the right neck and left neck confirmed that the
right internal jugular vein and left internal jugular vein are
occluded secondary to prior catheter placement.

The right upper chest was prepped with chlorhexidine, and draped in
the usual sterile fashion using maximum barrier technique (cap and
mask, sterile gown, sterile gloves, large sterile sheet, hand
hygiene and cutaneous antiseptic). Local anesthesia was attained by
infiltration with 1% lidocaine without epinephrine.

Ultrasound demonstrated patency of the right subclavian vein, and
this was documented with an image. Under real-time ultrasound
guidance, this vein was accessed with a 21 gauge micropuncture
needle and image documentation was performed. A small dermatotomy
was made at the access site with an 11 scalpel. A 0.018" wire was
advanced into the SVC and the access needle exchanged for a 4F
micropuncture vascular sheath. The 0.018" wire was then removed and
a 0.035" wire advanced into the IVC.

An appropriate location for the tunneled catheter was selected below
the clavicle and an incision was made through the skin and
underlying soft tissues. The skin and subcutaneous tissues below the
right clavicle were generously infiltrated with 1% lidocaine. A
small stab incision with an 11 blade scalpel was made, and a 19 cm
tip to cuff split tip hemodialysis catheter was back tunneled to the
puncture incision site.

The venous access site was then serially dilated and a peel away
vascular sheath placed over the wire. The wire was removed and the
port catheter advanced into position under fluoroscopic guidance.
The catheter tip is positioned in the right atrium. This was
documented with a spot image. Excellent flow both the red and blue
port was confirmed, and the red and blue ports were flushed with
heparinized saline lock. A retention suture was placed in each of
the wings. Dermabond was used to seal the puncture site.

Patient tolerated procedure well and remained hemodynamically stable
throughout.

No complications were encountered and no significant blood loss was
encountered.

COMPLICATIONS:
None.  The patient tolerated the procedure well.
IMPRESSION: Status post placement of tunneled 19 cm tip to cuff split tip
hemodialysis catheter is in the right subclavian vein. Catheter is
ready for use.

Ultrasound survey documents occlusion of the right and left internal
jugular veins.
